# Patient Record
Sex: Male | Born: 1960 | Race: Black or African American | Hispanic: No | Marital: Single | Smoking: Current some day smoker
Health system: Southern US, Community
[De-identification: ages and names within clinical notes are randomized; demographics above are authoritative.]

## PROBLEM LIST (undated history)

## (undated) DIAGNOSIS — I10 Essential (primary) hypertension: Secondary | ICD-10-CM

---

## 2000-07-14 ENCOUNTER — Emergency Department (HOSPITAL_COMMUNITY): Admission: EM | Admit: 2000-07-14 | Discharge: 2000-07-14 | Payer: Self-pay | Admitting: Emergency Medicine

## 2003-10-01 ENCOUNTER — Emergency Department (HOSPITAL_COMMUNITY): Admission: EM | Admit: 2003-10-01 | Discharge: 2003-10-01 | Payer: Self-pay | Admitting: Family Medicine

## 2003-10-01 ENCOUNTER — Emergency Department (HOSPITAL_COMMUNITY): Admission: EM | Admit: 2003-10-01 | Discharge: 2003-10-01 | Payer: Self-pay | Admitting: Emergency Medicine

## 2003-10-04 ENCOUNTER — Emergency Department (HOSPITAL_COMMUNITY): Admission: EM | Admit: 2003-10-04 | Discharge: 2003-10-04 | Payer: Self-pay | Admitting: Family Medicine

## 2004-01-28 ENCOUNTER — Encounter: Admission: RE | Admit: 2004-01-28 | Discharge: 2004-02-26 | Payer: Self-pay | Admitting: Chiropractic Medicine

## 2007-02-21 ENCOUNTER — Emergency Department (HOSPITAL_COMMUNITY): Admission: EM | Admit: 2007-02-21 | Discharge: 2007-02-21 | Payer: Self-pay | Admitting: Family Medicine

## 2010-10-25 ENCOUNTER — Emergency Department (INDEPENDENT_AMBULATORY_CARE_PROVIDER_SITE_OTHER): Payer: Self-pay

## 2010-10-25 ENCOUNTER — Emergency Department (HOSPITAL_BASED_OUTPATIENT_CLINIC_OR_DEPARTMENT_OTHER)
Admission: EM | Admit: 2010-10-25 | Discharge: 2010-10-25 | Disposition: A | Discharge status: Home / Self Care | Payer: Self-pay | Attending: Emergency Medicine | Admitting: Emergency Medicine

## 2010-10-25 DIAGNOSIS — M7040 Prepatellar bursitis, unspecified knee: Secondary | ICD-10-CM | POA: Insufficient documentation

## 2010-10-25 DIAGNOSIS — M25569 Pain in unspecified knee: Secondary | ICD-10-CM | POA: Insufficient documentation

## 2010-10-25 DIAGNOSIS — F17200 Nicotine dependence, unspecified, uncomplicated: Secondary | ICD-10-CM | POA: Insufficient documentation

## 2010-10-25 MED ORDER — IBUPROFEN 800 MG PO TABS: 800.0000 mg | ORAL_TABLET | Freq: Three times a day (TID) | ORAL | Status: AC

## 2010-10-25 NOTE — ED Provider Notes (Signed)
History    Scribed for No att. providers found, the patient was seen in room MH05/MH05. This chart was scribed by Katha Cabal. This patient's care was started at 20:00.     CSN: [id number] Arrival date & time: 10/25/2010  7:21 PM  Chief Complaint  Patient presents with  . Knee Pain    HPI  (Consider location/radiation/quality/duration/timing/severity/associated sxs/prior treatment)  HPI  Brandon Snow is a 50 y.o. male who presents to the Emergency Department complaining of gradual worsening of intermittent right knee pain that began about a month and a half ago but got worse 2 days ago.  Denies injury, edema, weakness, tingling and back pain.  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  Patient takes Ibuprofen for pain.  Denies any other health problems.   No PCP    PAST MEDICAL HISTORY:  History reviewed. No pertinent past medical history.  PAST SURGICAL HISTORY:  History reviewed. No pertinent past surgical history.  FAMILY HISTORY:  No family history on file.   SOCIAL HISTORY: History   Social History  . Marital Status: Single    Spouse Name: N/A    Number of Children: N/A  . Years of Education: N/A   Social History Main Topics  . Smoking status: Current Some Day Smoker  . Smokeless tobacco: None  . Alcohol Use: Yes  . Drug Use: No  . Sexually Active:    Other Topics Concern  . None   Social History Narrative  . None      Review of Systems  Review of Systems 10 Systems reviewed and are negative for acute change except as noted in the HPI.  Allergies  Review of patient's allergies indicates no known allergies.  Home Medications   Current Outpatient Rx  Name Route Sig Dispense Refill  . OVER THE COUNTER MEDICATION Oral Take 6 tablets by mouth daily. Vitamin Pack     . IBUPROFEN 800 MG PO TABS Oral Take 1 tablet (800 mg total) by mouth 3 (three) times daily. 21 tablet 0    Physical  Exam    BP 128/68  Pulse 73  Temp(Src) 98.1 F (36.7 C) (Oral)  Resp 18  Ht 5\' 8"  (1.727 m)  Wt 284 lb (128.822 kg)  BMI 43.18 kg/m2  SpO2 100%  Physical Exam  Nursing note and vitals reviewed. Constitutional: He is oriented to person, place, and time. He appears well-developed and well-nourished. No distress.  HENT:  Head: Normocephalic and atraumatic.  Eyes: EOM are normal. Pupils are equal, round, and reactive to light.  Neck: Neck supple.  Cardiovascular: Normal rate, regular rhythm and normal heart sounds.   Pulmonary/Chest: Effort normal and breath sounds normal. No respiratory distress. He has no wheezes. He has no rales.  Abdominal: Soft. There is no tenderness.  Musculoskeletal: Normal range of motion. He exhibits edema.       Anterior patella edema but more right than left.  Mild tenderness with palpation.  Good ROM.    Neurological: He is alert and oriented to person, place, and time. No sensory deficit.  Skin: Skin is warm and dry.       Skin is darkened and thickened over patella bilaterally.    Psychiatric: He has a normal mood and affect. His behavior is normal.    ED Course  Procedures (including critical care time)  OTHER DATA REVIEWED: Nursing notes, vital signs, and past medical records reviewed.  DIAGNOSTIC STUDIES: Oxygen Saturation is 100% on room air, normal by my interpretation.     LABS / RADIOLOGY:  No results found for this or any previous visit.  Dg Knee 4 Views W/patella Right  10/25/2010  *RADIOLOGY REPORT*  Clinical Data: Right anterior knee pain after twisting injury  RIGHT KNEE - COMPLETE 4+ VIEW  Comparison: None.  Findings: The knee is aligned.  The patella is well positioned on the sunrise view.  The joint spaces are maintained.  No acute or healing fracture, focal bony abnormality, joint effusion, or discrete soft tissue swelling is seen.  IMPRESSION: No acute bony abnormality.  Original Report Authenticated By: Britta Mccreedy, M.D.       ED COURSE / COORDINATION OF CARE:  Orders Placed This Encounter  Procedures  . DG Knee 4 Views W/Patella Right    MDM:  Bursa inflammation of right knee.  Will XR right knee.   Will treat prepatellar bursitis with antiinflammatories.  Discussed work activities and way to alleviate knee pressure at work.  Referral to orthopedist.     IMPRESSION: Diagnoses that have been ruled out:  Diagnoses that are still under consideration:  Final diagnoses:  Prepatellar bursitis     MEDICATIONS GIVEN IN THE E.D. Scheduled Meds:   Continuous Infusions:      DISCHARGE MEDICATIONS: New Prescriptions   IBUPROFEN (ADVIL,MOTRIN) 800 MG TABLET    Take 1 tablet (800 mg total) by mouth 3 (three) times daily.     I personally performed the services described in this documentation, which was scribed in my presence. The recorded information has been reviewed and considered. Hilario Quarry, MD         Hilario Quarry, MD 10/25/10 [phone]

## 2010-10-25 NOTE — ED Notes (Signed)
Right knee pain x 2 days-denies injury 

## 2010-11-18 ENCOUNTER — Ambulatory Visit: Payer: Self-pay | Admitting: Family Medicine

## 2010-11-25 ENCOUNTER — Ambulatory Visit (INDEPENDENT_AMBULATORY_CARE_PROVIDER_SITE_OTHER): Payer: Self-pay | Admitting: Family Medicine

## 2010-11-25 ENCOUNTER — Encounter: Payer: Self-pay | Admitting: Family Medicine

## 2010-11-25 VITALS — BP 128/83 | HR 91 | Temp 98.0°F | Ht 68.0 in | Wt 270.0 lb

## 2010-11-25 DIAGNOSIS — M25569 Pain in unspecified knee: Secondary | ICD-10-CM

## 2010-11-25 DIAGNOSIS — M25561 Pain in right knee: Secondary | ICD-10-CM

## 2010-11-25 MED ORDER — MELOXICAM 15 MG PO TABS: 15.0000 mg | ORAL_TABLET | Freq: Every day | ORAL | Status: DC

## 2010-11-25 NOTE — Patient Instructions (Signed)
You have patellar tendinitis of your right knee. Take meloxicam 15mg  daily with food for pain and inflammation  Icing 15 minutes at a time 3-4 times a day Once you get approval for coverage from [facility], call us at [phone] and we'll start you in physical therapy - this is the most important part of treatment. Eccentric exercise - squat on hill 3 sets of 10 once a day. See handout for other exercises/stretches that may help. Follow up with me about 6 weeks after physical therapy.

## 2010-11-26 ENCOUNTER — Encounter: Payer: Self-pay | Admitting: Family Medicine

## 2010-11-26 DIAGNOSIS — M25561 Pain in right knee: Secondary | ICD-10-CM | POA: Insufficient documentation

## 2010-11-26 NOTE — Assessment & Plan Note (Signed)
2/2 patellar tendinopathy.  He is going to contact [facility] to see if he can get [facility] coverage to start physical therapy.  Discussed home exercise program in the meantime, icing, start meloxicam with food for pain and inflammation.  Handout provided.  Chopat strap felt more comfortable to patient.  See instructions for further.  Advised to call when coverage goes through and we'll then order physical therapy.

## 2010-11-26 NOTE — Progress Notes (Signed)
  Subjective:    Patient ID: Brandon Snow, male    DOB: 1960-11-03, 50 y.o.   MRN: [id number]  PCP: None  HPI 50 yo M here for right knee pain.  Patient denies any known injury. States pain started about 2 months ago in anterior right knee. Some associated swelling but no bruising. Feels like going to give out at times when painful. No catching or locking. Took some ibuprofen. No prior right knee problems. 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 Went to ED and had x-rays that were normal - told he had bursitis and to follow-up here. Was given [name] paperwork today.  History reviewed. No pertinent past medical history.  Current Outpatient Prescriptions on File Prior to Visit  Medication Sig Dispense Refill  . OVER THE COUNTER MEDICATION Take 6 tablets by mouth daily. Vitamin Pack         History reviewed. No pertinent past surgical history.  No Known Allergies  History   Social History  . Marital Status: Single    Spouse Name: N/A    Number of Children: N/A  . Years of Education: N/A   Occupational History  . Not on file.   Social History Main Topics  . Smoking status: Current Some Day Smoker  . Smokeless tobacco: Not on file  . Alcohol Use: Yes  . Drug Use: No  . Sexually Active: Not on file   Other Topics Concern  . Not on file   Social History Narrative  . No narrative on file    Family History  Problem Relation Age of Onset  . Diabetes Mother   . Hyperlipidemia Mother   . Hyperlipidemia Father   . Hypertension Father   . Hypertension Sister   . Hyperlipidemia Sister   . Diabetes Sister   . Diabetes Maternal Grandfather   . Diabetes Paternal Grandmother   . Hyperlipidemia Paternal Grandmother   . Hypertension Paternal Grandmother   . Sudden death Paternal Grandmother   . Heart attack Neg Hx     BP 128/83  Pulse 91  Temp(Src) 98 F (36.7 C) (Oral)  Ht 5\' 8"  (1.727 m)  Wt 270 lb (122.471 kg)  BMI 41.05 kg/m2  Review  of Systems See HPI above.    Objective:   Physical Exam Gen: NAD R knee: No gross deformity, ecchymoses, swelling. Mild TTP patellar tendon.  No TTP prepatellar area, joint lines, pes, post patellar facets, elsewhere about right knee. FROM. Negative ant/post drawers. Negative valgus/varus testing. Negative lachmanns. Negative mcmurrays, apleys, patellar apprehension, clarkes. NV intact distally.    Assessment & Plan:  1. Right knee pain - 2/2 patellar tendinopathy.  He is going to contact [facility] to see if he can get [facility] coverage to start physical therapy.  Discussed home exercise program in the meantime, icing, start meloxicam with food for pain and inflammation.  Handout provided.  Chopat strap felt more comfortable to patient.  See instructions for further.  Advised to call when coverage goes through and we'll then order physical therapy.

## 2010-12-28 ENCOUNTER — Emergency Department (HOSPITAL_BASED_OUTPATIENT_CLINIC_OR_DEPARTMENT_OTHER)
Admission: EM | Admit: 2010-12-28 | Discharge: 2010-12-28 | Disposition: A | Discharge status: Home / Self Care | Payer: Self-pay | Attending: Emergency Medicine | Admitting: Emergency Medicine

## 2010-12-28 ENCOUNTER — Encounter (HOSPITAL_BASED_OUTPATIENT_CLINIC_OR_DEPARTMENT_OTHER): Payer: Self-pay | Admitting: Emergency Medicine

## 2010-12-28 DIAGNOSIS — K0889 Other specified disorders of teeth and supporting structures: Secondary | ICD-10-CM

## 2010-12-28 DIAGNOSIS — Z79899 Other long term (current) drug therapy: Secondary | ICD-10-CM | POA: Insufficient documentation

## 2010-12-28 DIAGNOSIS — K089 Disorder of teeth and supporting structures, unspecified: Secondary | ICD-10-CM | POA: Insufficient documentation

## 2010-12-28 MED ORDER — HYDROCODONE-ACETAMINOPHEN 5-500 MG PO TABS: 1.0000 | ORAL_TABLET | Freq: Four times a day (QID) | ORAL | Status: AC | PRN

## 2010-12-28 MED ORDER — KETOROLAC TROMETHAMINE 60 MG/2ML IM SOLN
60.0000 mg | Freq: Once | INTRAMUSCULAR | Status: AC
  Administered 2010-12-28: 60 mg via INTRAMUSCULAR
  Filled 2010-12-28: qty 2

## 2010-12-28 MED ORDER — PENICILLIN V POTASSIUM 500 MG PO TABS: 500.0000 mg | ORAL_TABLET | Freq: Four times a day (QID) | ORAL | Status: AC

## 2010-12-28 NOTE — ED Provider Notes (Signed)
History     CSN: [id number] Arrival date & time: 12/28/2010  1:08 AM   First MD Initiated Contact with Patient 12/28/10 0145      Chief Complaint  Patient presents with  . Dental Pain    (Consider location/radiation/quality/duration/timing/severity/associated sxs/prior treatment) HPI Comments: Toothache, long history of severe dental disease, seen dentist at [facility] for same. Was seen 2 weeks ago and told needed to have a tooth pulled  Symptoms are constant Symptoms are gradually getting worse Not associated with fever or vomiting Worse with chewing and temperature sensitivity  Patient is a 50 y.o. male presenting with tooth pain. The history is provided by the patient.  Dental PainPrimary symptoms do not include fever or sore throat. Primary symptoms comment: Toothache Episode onset: Several days ago. The symptoms are worsening. The symptoms are chronic. The symptoms occur constantly.  Additional symptoms include: dental sensitivity to temperature and gum tenderness. Additional symptoms do not include: gum swelling, purulent gums, trismus, jaw pain, facial swelling, trouble swallowing, pain with swallowing and ear pain. Associated symptoms comments: No fever or vomiting.    History reviewed. No pertinent past medical history.  History reviewed. No pertinent past surgical history.  Family History  Problem Relation Age of Onset  . Diabetes Mother   . Hyperlipidemia Mother   . Hyperlipidemia Father   . Hypertension Father   . Hypertension Sister   . Hyperlipidemia Sister   . Diabetes Sister   . Diabetes Maternal Grandfather   . Diabetes Paternal Grandmother   . Hyperlipidemia Paternal Grandmother   . Hypertension Paternal Grandmother   . Sudden death Paternal Grandmother   . Heart attack Neg Hx     History  Substance Use Topics  . Smoking status: Current Some Day Smoker  . Smokeless tobacco: Not on file  . Alcohol Use: Yes      Review of Systems    Constitutional: Negative for fever and chills.  HENT: Positive for dental problem. Negative for ear pain, sore throat, facial swelling, trouble swallowing and voice change.        Toothache  Gastrointestinal: Negative for nausea and vomiting.    Allergies  Review of patient's allergies indicates no known allergies.  Home Medications   Current Outpatient Rx  Name Route Sig Dispense Refill  . ACETAMINOPHEN 325 MG PO TABS Oral Take 650 mg by mouth as needed.      Marland Kitchen HYDROCODONE-ACETAMINOPHEN 5-500 MG PO TABS Oral Take 1-2 tablets by mouth every 6 (six) hours as needed for pain. 15 tablet 0  . MELOXICAM 15 MG PO TABS Oral Take 1 tablet (15 mg total) by mouth daily. With food. 30 tablet 1  . OVER THE COUNTER MEDICATION Oral Take 6 tablets by mouth daily. Vitamin Pack     . PENICILLIN V POTASSIUM 500 MG PO TABS Oral Take 1 tablet (500 mg total) by mouth 4 (four) times daily. 40 tablet 0    BP 215/95  Pulse 68  Temp(Src) 97.8 F (36.6 C) (Oral)  Resp 19  SpO2 96%  Physical Exam  Nursing note and vitals reviewed. Constitutional: He appears well-developed and well-nourished. No distress.  HENT:  Head: Normocephalic and atraumatic.  Mouth/Throat: Oropharynx is clear and moist. No oropharyngeal exudate.       Dental Disease - multiple missing teeth, right upper tooth with severe disease, has been decayed to the level of the gums, no palpable abscess or fluctuant mass  Eyes: Conjunctivae are normal. No scleral icterus.  Neck: Normal  range of motion. Neck supple. No thyromegaly present.  Cardiovascular: Normal rate and regular rhythm.   Pulmonary/Chest: Effort normal and breath sounds normal.  Lymphadenopathy:    He has no cervical adenopathy.  Neurological: He is alert.  Skin: Skin is warm and dry. No rash noted. He is not diaphoretic.    ED Course  Procedures (including critical care time)  Labs Reviewed - No data to display No results found.   1. Toothache       MDM   Dental disease significant, pain medication given and encouraged to followup closely for further definitive dental intervention. Penicillin and hydrocodone and ibuprofen for home. Will also encourage recheck hypertension.  Intramuscular Toradol given  On recheck, vital signs appear acceptable for discharge.        Vida Roller, MD 12/28/10 [phone]

## 2010-12-28 NOTE — ED Notes (Signed)
Pt c/o tooth pain upper front tooth.

## 2010-12-28 NOTE — ED Notes (Signed)
Pt states he has not been taking his mobic regularly.

## 2011-06-22 ENCOUNTER — Emergency Department (HOSPITAL_BASED_OUTPATIENT_CLINIC_OR_DEPARTMENT_OTHER)
Admission: EM | Admit: 2011-06-22 | Discharge: 2011-06-22 | Disposition: A | Discharge status: Home / Self Care | Payer: No Typology Code available for payment source | Attending: Emergency Medicine | Admitting: Emergency Medicine

## 2011-06-22 ENCOUNTER — Emergency Department (HOSPITAL_BASED_OUTPATIENT_CLINIC_OR_DEPARTMENT_OTHER): Payer: No Typology Code available for payment source

## 2011-06-22 ENCOUNTER — Encounter (HOSPITAL_BASED_OUTPATIENT_CLINIC_OR_DEPARTMENT_OTHER): Payer: Self-pay

## 2011-06-22 DIAGNOSIS — M545 Low back pain, unspecified: Secondary | ICD-10-CM | POA: Insufficient documentation

## 2011-06-22 DIAGNOSIS — M549 Dorsalgia, unspecified: Secondary | ICD-10-CM | POA: Insufficient documentation

## 2011-06-22 MED ORDER — HYDROCODONE-ACETAMINOPHEN 5-500 MG PO TABS: 1.0000 | ORAL_TABLET | Freq: Four times a day (QID) | ORAL | Status: AC | PRN

## 2011-06-22 NOTE — ED Provider Notes (Signed)
Medical screening examination/treatment/procedure(s) were performed by non-physician practitioner and as supervising physician I was immediately available for consultation/collaboration.  Ethelda Chick, MD 06/22/11 [phone]
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
## 2011-06-22 NOTE — Discharge Instructions (Signed)
Back Pain, Adult Low back pain is very common. About 1 in 5 people have back pain.The cause of low back pain is rarely dangerous. The pain often gets better over time.About half of people with a sudden onset of back pain feel better in just 2 weeks. About 8 in 10 people feel better by 6 weeks.  CAUSES Some common causes of back pain include:  Strain of the muscles or ligaments supporting the spine.   Wear and tear (degeneration) of the spinal discs.   Arthritis.   Direct injury to the back.  DIAGNOSIS Most of the time, the direct cause of low back pain is not known.However, back pain can be treated effectively even when the exact cause of the pain is unknown.Answering your caregiver's questions about your overall health and symptoms is one of the most accurate ways to make sure the cause of your pain is not dangerous. If your caregiver needs more information, he or she may order lab work or imaging tests (X-rays or MRIs).However, even if imaging tests show changes in your back, this usually does not require surgery. HOME CARE INSTRUCTIONS For many people, back pain returns.Since low back pain is rarely dangerous, it is often a condition that people can learn to manageon their own.   Remain active. It is stressful on the back to sit or stand in one place. Do not sit, drive, or stand in one place for more than 30 minutes at a time. Take short walks on level surfaces as soon as pain allows.Try to increase the length of time you walk each day.   Do not stay in bed.Resting more than 1 or 2 days can delay your recovery.   Do not avoid exercise or work.Your body is made to move.It is not dangerous to be active, even though your back may hurt.Your back will likely heal faster if you return to being active before your pain is gone.   Pay attention to your body when you bend and lift. Many people have less discomfortwhen lifting if they bend their knees, keep the load close to their  bodies,and avoid twisting. Often, the most comfortable positions are those that put less stress on your recovering back.   Find a comfortable position to sleep. Use a firm mattress and lie on your side with your knees slightly bent. If you lie on your back, put a pillow under your knees.   Only take over-the-counter or prescription medicines as directed by your caregiver. Over-the-counter medicines to reduce pain and inflammation are often the most helpful.Your caregiver may prescribe muscle relaxant drugs.These medicines help dull your pain so you can more quickly return to your normal activities and healthy exercise.   Put ice on the injured area.   Put ice in a plastic bag.   Place a towel between your skin and the bag.   Leave the ice on for 15 to 20 minutes, 3 to 4 times a day for the first 2 to 3 days. After that, ice and heat may be alternated to reduce pain and spasms.   Ask your caregiver about trying back exercises and gentle massage. This may be of some benefit.   Avoid feeling anxious or stressed.Stress increases muscle tension and can worsen back pain.It is important to recognize when you are anxious or stressed and learn ways to manage it.Exercise is a great option.  SEEK MEDICAL CARE IF:  You have pain that is not relieved with rest or medicine.   You have   pain that does not improve in 1 week.   You have new symptoms.   You are generally not feeling well.  SEEK IMMEDIATE MEDICAL CARE IF:   You have pain that radiates from your back into your legs.   You develop new bowel or bladder control problems.   You have unusual weakness or numbness in your arms or legs.   You develop nausea or vomiting.   You develop abdominal pain.   You feel faint.  Document Released: 01/17/2005 Document Revised: 01/06/2011 Document Reviewed: 06/07/2010 [employer] Patient Information 2012 [employer]. 

## 2011-06-23 MED ORDER — FLUORESCEIN SODIUM 1 MG OP STRP
ORAL_STRIP | OPHTHALMIC | Status: AC
  Filled 2011-06-23: qty 1

## 2011-06-23 MED ORDER — TETRACAINE HCL 0.5 % OP SOLN
OPHTHALMIC | Status: AC
  Filled 2011-06-23: qty 2

## 2011-07-27 ENCOUNTER — Emergency Department (HOSPITAL_BASED_OUTPATIENT_CLINIC_OR_DEPARTMENT_OTHER)
Admission: EM | Admit: 2011-07-27 | Discharge: 2011-07-27 | Disposition: A | Discharge status: Home / Self Care | Payer: Self-pay | Attending: Emergency Medicine | Admitting: Emergency Medicine

## 2011-07-27 ENCOUNTER — Encounter (HOSPITAL_BASED_OUTPATIENT_CLINIC_OR_DEPARTMENT_OTHER): Payer: Self-pay | Admitting: *Deleted

## 2011-07-27 DIAGNOSIS — F17200 Nicotine dependence, unspecified, uncomplicated: Secondary | ICD-10-CM | POA: Insufficient documentation

## 2011-07-27 DIAGNOSIS — X500XXA Overexertion from strenuous movement or load, initial encounter: Secondary | ICD-10-CM | POA: Insufficient documentation

## 2011-07-27 DIAGNOSIS — S93409A Sprain of unspecified ligament of unspecified ankle, initial encounter: Secondary | ICD-10-CM | POA: Insufficient documentation

## 2011-07-27 DIAGNOSIS — Y9269 Other specified industrial and construction area as the place of occurrence of the external cause: Secondary | ICD-10-CM | POA: Insufficient documentation

## 2011-07-27 MED ORDER — IBUPROFEN 600 MG PO TABS: 600.0000 mg | ORAL_TABLET | Freq: Four times a day (QID) | ORAL | Status: AC | PRN

## 2011-07-27 MED ORDER — TRAMADOL HCL 50 MG PO TABS: 50.0000 mg | ORAL_TABLET | Freq: Four times a day (QID) | ORAL | Status: AC | PRN

## 2011-07-27 NOTE — ED Provider Notes (Signed)
History     CSN: [id number]  Arrival date & time 07/27/11  4098   First MD Initiated Contact with Patient 07/27/11 2002      Chief Complaint  Patient presents with  . Foot Pain  Pt p/w 3 days of L ankle pain. No definite trauma. Pain is worse with standing and improves when off feet. 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 No pertinent past medical history.  History reviewed. No pertinent past surgical history.  Family History  Problem Relation Age of Onset  . Diabetes Mother   . Hyperlipidemia Mother   . Hyperlipidemia Father   . Hypertension Father   . Hypertension Sister   . Hyperlipidemia Sister   . Diabetes Sister   . Diabetes Maternal Grandfather   . Diabetes Paternal Grandmother   . Hyperlipidemia Paternal Grandmother   . Hypertension Paternal Grandmother   . Sudden death Paternal Grandmother   . Heart attack Neg Hx     History  Substance Use Topics  . Smoking status: Current Some Day Smoker  . Smokeless tobacco: Not on file  . Alcohol Use: No      Review of Systems  Musculoskeletal: Positive for arthralgias. Negative for joint swelling.  Skin: Negative for rash and wound.  Neurological: Negative for weakness and numbness.    Allergies  Review of patient's allergies indicates no known allergies.  Home Medications   Current Outpatient Rx  Name Route Sig Dispense Refill  . IBUPROFEN 600 MG PO TABS Oral Take 1 tablet (600 mg total) by mouth every 6 (six) hours as needed for pain. 30 tablet 0  . TRAMADOL HCL 50 MG PO TABS Oral Take 1 tablet (50 mg total) by mouth every 6 (six) hours as needed for pain. 20 tablet 0    BP 158/86  Pulse 95  Temp 98.1 F (36.7 C) (Oral)  Resp 16  Ht 5\' 8"  (1.727 m)  Wt 290 lb (131.543 kg)  BMI 44.09 kg/m2  SpO2 100%  Physical Exam  Nursing note and vitals reviewed. Constitutional: He  is oriented to person, place, and time. He appears well-developed and well-nourished. No distress.  HENT:  Head: Normocephalic and atraumatic.  Pulmonary/Chest: Effort normal and breath sounds normal.  Abdominal: Soft. Bowel sounds are normal.  Musculoskeletal: Normal range of motion. He exhibits tenderness (mild ttp distal to L lateral malleolus. No swelling or obvious injury. ROM. Neurovasc intact). He exhibits no edema.  Neurological: He is alert and oriented to person, place, and time.  Skin: Skin is warm and dry. No rash noted. No erythema.  Psychiatric: He has a normal mood and affect. His behavior is normal.    ED Course  Procedures (including critical care time)  Labs Reviewed - No data to display No results found.   1. Ankle sprain       MDM          Loren Racer, MD 07/27/11 2014

## 2011-07-27 NOTE — Discharge Instructions (Signed)
Ankle Sprain An ankle sprain is an injury to the strong, fibrous tissues (ligaments) that hold the bones of your ankle joint together.  CAUSES Ankle sprain usually is caused by a fall or by twisting your ankle. People who participate in sports are more prone to these types of injuries.  SYMPTOMS  Symptoms of ankle sprain include:  Pain in your ankle. The pain may be present at rest or only when you are trying to stand or walk.   Swelling.   Bruising. Bruising may develop immediately or within 1 to 2 days after your injury.   Difficulty standing or walking.  DIAGNOSIS  Your caregiver will ask you details about your injury and perform a physical exam of your ankle to determine if you have an ankle sprain. During the physical exam, your caregiver will press and squeeze specific areas of your foot and ankle. Your caregiver will try to move your ankle in certain ways. An X-ray exam may be done to be sure a bone was not broken or a ligament did not separate from one of the bones in your ankle (avulsion).  TREATMENT  Certain types of braces can help stabilize your ankle. Your caregiver can make a recommendation for this. Your caregiver may recommend the use of medication for pain. If your sprain is severe, your caregiver may refer you to a surgeon who helps to restore function to parts of your skeletal system (orthopedist) or a physical therapist. HOME CARE INSTRUCTIONS  Apply ice to your injury for 1 to 2 days or as directed by your caregiver. Applying ice helps to reduce inflammation and pain.  Put ice in a plastic bag.   Place a towel between your skin and the bag.   Leave the ice on for 15 to 20 minutes at a time, every 2 hours while you are awake.   Take over-the-counter or prescription medicines for pain, discomfort, or fever only as directed by your caregiver.   Keep your injured leg elevated, when possible, to lessen swelling.   If your caregiver recommends crutches, use them as  instructed. Gradually, put weight on the affected ankle. Continue to use crutches or a cane until you can walk without feeling pain in your ankle.   If you have a plaster splint, wear the splint as directed by your caregiver. Do not rest it on anything harder than a pillow the first 24 hours. Do not put weight on it. Do not get it wet. You may take it off to take a shower or bath.   You may have been given an elastic bandage to wear around your ankle to provide support. If the elastic bandage is too tight (you have numbness or tingling in your foot or your foot becomes cold and blue), adjust the bandage to make it comfortable.   If you have an air splint, you may blow more air into it or let air out to make it more comfortable. You may take your splint off at night and before taking a shower or bath.   Wiggle your toes in the splint several times per day if you are able.  SEEK MEDICAL CARE IF:   You have an increase in bruising, swelling, or pain.   Your toes feel cold.   Pain relief is not achieved with medication.  SEEK IMMEDIATE MEDICAL CARE IF: Your toes are numb or blue or you have severe pain. MAKE SURE YOU:   Understand these instructions.   Will watch your condition.     Will get help right away if you are not doing well or get worse.  Document Released: 01/17/2005 Document Revised: 01/06/2011 Document Reviewed: 08/22/2007 [employer] Patient Information 2012 [employer]. 

## 2011-07-27 NOTE — ED Notes (Signed)
Pt c/o left foot pain x 3 days , denies injury

## 2014-04-06 ENCOUNTER — Encounter (HOSPITAL_BASED_OUTPATIENT_CLINIC_OR_DEPARTMENT_OTHER): Payer: Self-pay | Admitting: *Deleted

## 2014-04-06 ENCOUNTER — Emergency Department (HOSPITAL_BASED_OUTPATIENT_CLINIC_OR_DEPARTMENT_OTHER)
Admission: EM | Admit: 2014-04-06 | Discharge: 2014-04-06 | Disposition: A | Discharge status: Home / Self Care | Payer: Self-pay | Attending: Emergency Medicine | Admitting: Emergency Medicine

## 2014-04-06 ENCOUNTER — Emergency Department (HOSPITAL_BASED_OUTPATIENT_CLINIC_OR_DEPARTMENT_OTHER): Payer: Self-pay

## 2014-04-06 DIAGNOSIS — B349 Viral infection, unspecified: Secondary | ICD-10-CM | POA: Insufficient documentation

## 2014-04-06 DIAGNOSIS — Z720 Tobacco use: Secondary | ICD-10-CM | POA: Insufficient documentation

## 2014-04-06 DIAGNOSIS — M791 Myalgia: Secondary | ICD-10-CM | POA: Insufficient documentation

## 2014-04-06 MED ORDER — BENZONATATE 100 MG PO CAPS: 100.0000 mg | ORAL_CAPSULE | Freq: Three times a day (TID) | ORAL | Status: DC

## 2014-04-06 MED ORDER — BENZONATATE 100 MG PO CAPS
200.0000 mg | ORAL_CAPSULE | Freq: Once | ORAL | Status: AC
  Administered 2014-04-06: 200 mg via ORAL
  Filled 2014-04-06: qty 2

## 2014-04-06 MED ORDER — IBUPROFEN 800 MG PO TABS: 800.0000 mg | ORAL_TABLET | Freq: Three times a day (TID) | ORAL | Status: DC

## 2014-04-06 MED ORDER — LORATADINE 10 MG PO TABS
10.0000 mg | ORAL_TABLET | Freq: Once | ORAL | Status: AC
  Administered 2014-04-06: 10 mg via ORAL
  Filled 2014-04-06: qty 1

## 2014-04-06 MED ORDER — CETIRIZINE-PSEUDOEPHEDRINE ER 5-120 MG PO TB12
1.0000 | ORAL_TABLET | Freq: Two times a day (BID) | ORAL | Status: DC
Start: 2014-04-06 — End: 2016-10-15

## 2014-04-06 MED ORDER — GUAIFENESIN 100 MG/5ML PO SOLN
5.0000 mL | Freq: Once | ORAL | Status: AC
  Administered 2014-04-06: 100 mg via ORAL
  Filled 2014-04-06: qty 5

## 2014-04-06 MED ORDER — IBUPROFEN 800 MG PO TABS
800.0000 mg | ORAL_TABLET | Freq: Once | ORAL | Status: AC
  Administered 2014-04-06: 800 mg via ORAL
  Filled 2014-04-06: qty 1

## 2014-04-06 NOTE — ED Notes (Signed)
C/o fever, chills, dry cough for past several days. States he has been taking mucinex and tylenol without relief. C/o ant chest pain with cough. Denies any sob. resp even and unlabored.

## 2014-04-06 NOTE — ED Notes (Signed)
Dr. Nicanor AlconPalumbo in to see pt prior to RN assessment, see MD notes, orders received and initiated.

## 2014-04-06 NOTE — ED Provider Notes (Signed)
CSN: [id number]     Arrival date & time 04/06/14  0106 History   First MD Initiated Contact with Patient 04/06/14 [phone]     Chief Complaint  Patient presents with  . Cough     (Consider location/radiation/quality/duration/timing/severity/associated sxs/prior Treatment) Patient is a 54 y.o. male presenting with cough. The history is provided by the patient.  Cough Cough characteristics:  Non-productive Severity:  Moderate Onset quality:  Gradual Duration:  5 days Timing:  Intermittent Progression:  Unchanged Chronicity:  New Smoker: yes   Context: not animal exposure   Relieved by:  Nothing Worsened by:  Nothing tried Ineffective treatments:  None tried Associated symptoms: myalgias and sinus congestion   Associated symptoms: no chest pain, no chills, no ear pain, no shortness of breath, no sore throat and no wheezing   Risk factors: no chemical exposure     History reviewed. No pertinent past medical history. History reviewed. No pertinent past surgical history. Family History  Problem Relation Age of Onset  . Diabetes Mother   . Hyperlipidemia Mother   . Hyperlipidemia Father   . Hypertension Father   . Hypertension Sister   . Hyperlipidemia Sister   . Diabetes Sister   . Diabetes Maternal Grandfather   . Diabetes Paternal Grandmother   . Hyperlipidemia Paternal Grandmother   . Hypertension Paternal Grandmother   . Sudden death Paternal Grandmother   . Heart attack Neg Hx    History  Substance Use Topics  . Smoking status: Current Some Day Smoker  . Smokeless tobacco: Not on file  . Alcohol Use: No    Review of Systems  Constitutional: Negative for chills.  HENT: Positive for congestion. Negative for ear pain, facial swelling and sore throat.   Respiratory: Positive for cough. Negative for shortness of breath and wheezing.   Cardiovascular: Negative for chest pain.  Musculoskeletal: Positive for myalgias.  All other systems reviewed and are  negative.     Allergies  Review of patient's allergies indicates no known allergies.  Home Medications   Prior to Admission medications   Not on File   BP 126/73 mmHg  Pulse 95  Temp(Src) 99.9 F (37.7 C) (Oral)  Resp 20  Ht 5\' 8"  (1.727 m)  Wt 294 lb 1 oz (133.386 kg)  BMI 44.72 kg/m2  SpO2 96% Physical Exam  Constitutional: He is oriented to person, place, and time. He appears well-developed and well-nourished. No distress.  HENT:  Head: Normocephalic and atraumatic.  Mouth/Throat: Oropharynx is clear and moist. No oropharyngeal exudate.  Eyes: Conjunctivae and EOM are normal. Pupils are equal, round, and reactive to light.  Neck: Normal range of motion.  Cardiovascular: Normal rate, regular rhythm and intact distal pulses.   Pulmonary/Chest: Effort normal and breath sounds normal. No stridor. No respiratory distress. He has no wheezes. He has no rales.  Abdominal: Soft. Bowel sounds are normal. There is no tenderness. There is no rebound.  Musculoskeletal: Normal range of motion.  Lymphadenopathy:    He has no cervical adenopathy.  Neurological: He is alert and oriented to person, place, and time.  Skin: Skin is warm and dry.  Psychiatric: He has a normal mood and affect.    ED Course  Procedures (including critical care time) Labs Review Labs Reviewed - No data to display  Imaging Review Dg Chest 2 View  04/06/2014   CLINICAL DATA:  Acute onset of fever, chills and dry cough. Chest pain. Initial encounter.  EXAM: CHEST  2 VIEW  COMPARISON:  None.  FINDINGS: The lungs are well-aerated and clear. There is no evidence of focal opacification, pleural effusion or pneumothorax.  The heart is normal in size; the mediastinal contour is within normal limits. No acute osseous abnormalities are seen.  IMPRESSION: No acute cardiopulmonary process seen.   Electronically Signed   By: Roanna Raider M.D.   On: 04/06/2014 02:18     EKG Interpretation None      MDM   Final  diagnoses:  None    URI will treat symptomatically, strict return precautions given    Taviana Westergren Smitty Cords, MD 04/06/14 [phone]

## 2015-07-08 ENCOUNTER — Emergency Department (HOSPITAL_BASED_OUTPATIENT_CLINIC_OR_DEPARTMENT_OTHER)
Admission: EM | Admit: 2015-07-08 | Discharge: 2015-07-08 | Disposition: A | Discharge status: Home / Self Care | Payer: No Typology Code available for payment source | Attending: Emergency Medicine | Admitting: Emergency Medicine

## 2015-07-08 ENCOUNTER — Encounter (HOSPITAL_BASED_OUTPATIENT_CLINIC_OR_DEPARTMENT_OTHER): Payer: Self-pay | Admitting: *Deleted

## 2015-07-08 DIAGNOSIS — J029 Acute pharyngitis, unspecified: Secondary | ICD-10-CM | POA: Insufficient documentation

## 2015-07-08 DIAGNOSIS — B9789 Other viral agents as the cause of diseases classified elsewhere: Secondary | ICD-10-CM

## 2015-07-08 DIAGNOSIS — F17210 Nicotine dependence, cigarettes, uncomplicated: Secondary | ICD-10-CM | POA: Insufficient documentation

## 2015-07-08 DIAGNOSIS — M549 Dorsalgia, unspecified: Secondary | ICD-10-CM | POA: Insufficient documentation

## 2015-07-08 DIAGNOSIS — R000 Tachycardia, unspecified: Secondary | ICD-10-CM | POA: Insufficient documentation

## 2015-07-08 DIAGNOSIS — I10 Essential (primary) hypertension: Secondary | ICD-10-CM | POA: Insufficient documentation

## 2015-07-08 DIAGNOSIS — J028 Acute pharyngitis due to other specified organisms: Secondary | ICD-10-CM

## 2015-07-08 DIAGNOSIS — K0889 Other specified disorders of teeth and supporting structures: Secondary | ICD-10-CM | POA: Insufficient documentation

## 2015-07-08 HISTORY — DX: Essential (primary) hypertension: I10

## 2015-07-08 MED ORDER — AMOXICILLIN-POT CLAVULANATE 875-125 MG PO TABS: 1.0000 | ORAL_TABLET | Freq: Two times a day (BID) | ORAL | Status: DC

## 2015-07-08 MED ORDER — BUPIVACAINE-EPINEPHRINE (PF) 0.5% -1:200000 IJ SOLN
1.8000 mL | Freq: Once | INTRAMUSCULAR | Status: AC
Start: 2015-07-08 — End: 2015-07-08
  Administered 2015-07-08: 1.8 mL
  Filled 2015-07-08: qty 1.8

## 2015-07-08 NOTE — Discharge Instructions (Signed)
We did a nerve block today in the ED to try and provide some relief. This should last most of the day but it will eventually wear off.  Please call your dentist and discuss changing the procedure to removing that tooth first prior to the other teeth that was planned.  Take the antibiotic as prescribed.   You can use tylenol or ibuprofen to help with the pain, and you can also put cold compresses the right side of your jaw.  Your sore throat is most likely a virus, and should get better with time.

## 2015-07-08 NOTE — ED Provider Notes (Signed)
CSN: [id number]     Arrival date & time 07/08/15  1010 History   First MD Initiated Contact with Patient 07/08/15 1028     Chief Complaint  Patient presents with  . Generalized Body Aches  . Dental Pain   HPI Brandon Snow is a 55 y.o. male  presenting with fever, sore throat, tooth pain. Patient says he first had sore throat and fever about 3 days ago. Measured fever to be 101F (girlfriend is a Theatre stage manager and checked). Sore throat has been severe, 7/10; he is still able to swallow foods. Yesterday he says that he started developing right lower jaw tooth pain with temperature sensitivity that was interferring with drinking liquids. He took some tylenol this morning and seems to have helped the pain a little bit. He reports he has been seeing a dentist, was planned on removing left lower incisor later this week and eventually remove all his lower teeth and replace with denture (currently has no upper teeth, already planned denture replacement). He additionally complains of some right back/rib pain that is a dull ache. 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    (Consider location/radiation/quality/duration/timing/severity/associated sxs/prior Treatment) Patient is a 55 y.o. male presenting with tooth pain. The history is provided by the patient.  Dental Pain Location:  Lower Lower teeth location:  29/RL 2nd bicuspid Quality:  Aching Severity:  Moderate Onset quality:  Sudden Duration:  1 day Timing:  Constant Progression:  Unchanged Chronicity:  New Context: dental caries, poor dentition and recent dental surgery   Context: not trauma   Previous work-up:  Dental exam Relieved by:  Acetaminophen Worsened by:  Cold food/drink and touching Associated symptoms: fever   Associated symptoms: no neck pain, no neck swelling and no oral bleeding     Past Medical History  Diagnosis Date  . Hypertension    History reviewed. No pertinent past surgical  history. Family History  Problem Relation Age of Onset  . Diabetes Mother   . Hyperlipidemia Mother   . Hyperlipidemia Father   . Hypertension Father   . Hypertension Sister   . Hyperlipidemia Sister   . Diabetes Sister   . Diabetes Maternal Grandfather   . Diabetes Paternal Grandmother   . Hyperlipidemia Paternal Grandmother   . Hypertension Paternal Grandmother   . Sudden death Paternal Grandmother   . Heart attack Neg Hx    Social History  Substance Use Topics  . Smoking status: Current Some Day Smoker -- 0.50 packs/day    Types: Cigarettes  . Smokeless tobacco: Never Used  . Alcohol Use: No    Review of Systems  Constitutional: Positive for fever.  HENT: Positive for sore throat and trouble swallowing. Negative for ear discharge, ear pain and rhinorrhea.   Eyes: Negative for pain and redness.  Respiratory: Negative for shortness of breath.   Cardiovascular: Negative for chest pain.  Gastrointestinal: Negative for nausea, vomiting, abdominal pain, diarrhea, constipation and blood in stool.  Genitourinary: Negative for dysuria and difficulty urinating.  Musculoskeletal: Positive for back pain. Negative for neck pain.  Skin: Negative for rash.  Neurological: Negative for light-headedness.  All other systems reviewed and are negative.     Allergies  Review of patient's allergies indicates no known allergies.  Home Medications   Prior to Admission medications   Medication Sig Start Date End Date Taking? Authorizing Provider  benzonatate (TESSALON) 100 MG capsule Take 1 capsule (100 mg total) by  mouth every 8 (eight) hours. 04/06/14   April Palumbo, MD  cetirizine-pseudoephedrine (ZYRTEC-D) 5-120 MG per tablet Take 1 tablet by mouth 2 (two) times daily. 04/06/14   April Palumbo, MD  ibuprofen (ADVIL,MOTRIN) 800 MG tablet Take 1 tablet (800 mg total) by mouth 3 (three) times daily. 04/06/14   April Palumbo, MD   BP 152/100 mmHg  Pulse 108  Temp(Src) 99.4 F (37.4 C)  (Oral)  Resp 18  Ht 5' 8.5" (1.74 m)  Wt 129.275 kg  BMI 42.70 kg/m2  SpO2 99% Physical Exam  Constitutional: He is oriented to person, place, and time. He appears well-developed and well-nourished.  HENT:  Head: Normocephalic and atraumatic.  Right Ear: External ear normal.  Mouth/Throat: Abnormal dentition. Dental caries present. Posterior oropharyngeal erythema present. No oropharyngeal exudate or tonsillar abscesses.    All upper teeth missing. Missing majority of lower teeth, primarily front teeth left with molars removed. Poor dentition.   Left ear TM obstructed by cerumen  Eyes: Conjunctivae and EOM are normal. Pupils are equal, round, and reactive to light.  Neck: Normal range of motion. Neck supple.  No neck tenderness, no lymphadenopathy  Cardiovascular: Regular rhythm, normal heart sounds and intact distal pulses.  Tachycardia present.   Pulmonary/Chest: Effort normal and breath sounds normal. No respiratory distress.    Neurological: He is alert and oriented to person, place, and time.  Skin: Skin is warm and dry.  Psychiatric: He has a normal mood and affect. Thought content normal.  Nursing note and vitals reviewed.   ED Course  Procedures (including critical care time) Labs Review Labs Reviewed - No data to display  Imaging Review No results found. I have personally reviewed and evaluated these images and lab results as part of my medical decision-making.   EKG Interpretation None      MDM   Final diagnoses:  Pain in tooth  Sore throat (viral)    Modified centor score 1 (if counting measured fever 2 days ago) -- very low suspicion of strep throat. Bupivicaine inferior alveolar nerve block done in ED. Will discharge on antibiotics  Back/rib pain is likely musculoskeletal given his occupation, recommended stretching exercises, cold compresses, OTC analgesics.    Nani RavensAndrew M Elvena Oyer, MD 07/08/15 1302  Melene Planan Floyd, DO 07/08/15 [phone]

## 2015-07-08 NOTE — ED Notes (Signed)
MD at bedside. 

## 2015-07-08 NOTE — ED Notes (Signed)
Right side dental pain, body aches, chills, "slight fever" x 3 days. Pt reports he took 2 extra-strength tylenol at 0600

## 2015-07-08 NOTE — ED Notes (Signed)
Pt reports sore throat also

## 2016-10-15 ENCOUNTER — Encounter (HOSPITAL_BASED_OUTPATIENT_CLINIC_OR_DEPARTMENT_OTHER): Payer: Self-pay | Admitting: Emergency Medicine

## 2016-10-15 ENCOUNTER — Emergency Department (HOSPITAL_BASED_OUTPATIENT_CLINIC_OR_DEPARTMENT_OTHER)
Admission: EM | Admit: 2016-10-15 | Discharge: 2016-10-15 | Disposition: A | Discharge status: Home / Self Care | Payer: Self-pay | Attending: Emergency Medicine | Admitting: Emergency Medicine

## 2016-10-15 ENCOUNTER — Emergency Department (HOSPITAL_BASED_OUTPATIENT_CLINIC_OR_DEPARTMENT_OTHER): Payer: Self-pay

## 2016-10-15 DIAGNOSIS — I10 Essential (primary) hypertension: Secondary | ICD-10-CM | POA: Insufficient documentation

## 2016-10-15 DIAGNOSIS — Z79899 Other long term (current) drug therapy: Secondary | ICD-10-CM | POA: Insufficient documentation

## 2016-10-15 DIAGNOSIS — J209 Acute bronchitis, unspecified: Secondary | ICD-10-CM | POA: Insufficient documentation

## 2016-10-15 DIAGNOSIS — J40 Bronchitis, not specified as acute or chronic: Secondary | ICD-10-CM

## 2016-10-15 DIAGNOSIS — F17210 Nicotine dependence, cigarettes, uncomplicated: Secondary | ICD-10-CM | POA: Insufficient documentation

## 2016-10-15 MED ORDER — AZITHROMYCIN 250 MG PO TABS: ORAL_TABLET | ORAL | 0 refills | Status: DC

## 2016-10-15 MED ORDER — BENZONATATE 100 MG PO CAPS: 200.0000 mg | ORAL_CAPSULE | Freq: Two times a day (BID) | ORAL | 0 refills | Status: DC | PRN

## 2016-10-15 NOTE — ED Triage Notes (Signed)
Patient states that he has had a cough and nasal congestion x 1 week. Reports that she has pain when he coughs, otherwise just nasal pressure

## 2016-10-15 NOTE — ED Provider Notes (Signed)
[facility]   CSN: [id number] Arrival date & time: 10/15/16  1003     History   Chief Complaint Chief Complaint  Patient presents with  . Cough    HPI Brandon Snow is a 56 y.o. male who presents with chief complaint of cough. He has had a cough for the past 7 days with associated sinus pressure, body aches and fatigue. He denies any known fevers he denies a history of wheezing. He is a current daily smoker. He states that his cough is worse at night. He has been using over-the-counter cough suppressants with minimal relief.  HPI  Past Medical History:  Diagnosis Date  . Hypertension     Patient Active Problem List   Diagnosis Date Noted  . Right knee pain 11/26/2010    History reviewed. No pertinent surgical history.     Home Medications    Prior to Admission medications   Medication Sig Start Date End Date Taking? Authorizing Provider  amoxicillin-clavulanate (AUGMENTIN) 875-125 MG tablet Take 1 tablet by mouth 2 (two) times daily. 07/08/15   Nani Ravens, MD  benzonatate (TESSALON) 100 MG capsule Take 1 capsule (100 mg total) by mouth every 8 (eight) hours. 04/06/14   Palumbo, April, MD  cetirizine-pseudoephedrine (ZYRTEC-D) 5-120 MG per tablet Take 1 tablet by mouth 2 (two) times daily. 04/06/14   Palumbo, April, MD  ibuprofen (ADVIL,MOTRIN) 800 MG tablet Take 1 tablet (800 mg total) by mouth 3 (three) times daily. 04/06/14   Palumbo, April, MD    Family History Family History  Problem Relation Age of Onset  . Diabetes Mother   . Hyperlipidemia Mother   . Hyperlipidemia Father   . Hypertension Father   . Hypertension Sister   . Hyperlipidemia Sister   . Diabetes Sister   . Diabetes Maternal Grandfather   . Diabetes Paternal Grandmother   . Hyperlipidemia Paternal Grandmother   . Hypertension Paternal Grandmother   . Sudden death Paternal Grandmother   . Heart attack Neg Hx     Social History Social History  Substance Use  Topics  . Smoking status: Current Some Day Smoker    Packs/day: 0.50    Types: Cigarettes  . Smokeless tobacco: Never Used  . Alcohol use No     Allergies   Patient has no known allergies.   Review of Systems Review of Systems  Ten systems reviewed and are negative for acute change, except as noted in the HPI.   Physical Exam Updated Vital Signs BP 128/83 (BP Location: Left Arm)   Pulse 88   Temp 98.8 F (37.1 C) (Oral)   Resp 18   Ht  (1.727 m)   Wt 124.7 kg (275 lb)   SpO2 98%   BMI 41.81 kg/m   Physical Exam  Constitutional: He appears well-developed and well-nourished. No distress.  HENT:  Head: Normocephalic and atraumatic.  Eyes: Conjunctivae are normal. No scleral icterus.  Neck: Normal range of motion. Neck supple.  Cardiovascular: Normal rate, regular rhythm and normal heart sounds.   Pulmonary/Chest: Effort normal and breath sounds normal. No respiratory distress. He has no wheezes.  Rhonchi that clear with cough  Abdominal: Soft. There is no tenderness.  Musculoskeletal: He exhibits no edema.  Neurological: He is alert.  Skin: Skin is warm and dry. He is not diaphoretic.  Psychiatric: His behavior is normal.  Nursing note and vitals reviewed.    ED Treatments / Results  Labs (all labs ordered are  listed, but only abnormal results are displayed) Labs Reviewed - No data to display  EKG  EKG Interpretation None       Radiology Dg Chest 2 View  Result Date: 10/15/2016 CLINICAL DATA:  Cough and congestion for 1 week EXAM: CHEST  2 VIEW COMPARISON:  April 06, 2014 FINDINGS: The heart, hila, and mediastinum are normal. No pneumothorax. No nodules or masses. No focal infiltrates identified. Mild interstitial prominence centrally. IMPRESSION: Mild interstitial prominence centrally may represent an atypical infection are bronchitis. No focal infiltrate. Electronically Signed   By: Gerome Sam III M.D   On: 10/15/2016 10:24     Procedures Procedures (including critical care time)  Medications Ordered in ED Medications - No data to display   Initial Impression / Assessment and Plan / ED Course  I have reviewed the triage vital signs and the nursing notes.  Pertinent labs & imaging results that were available during my care of the patient were reviewed by me and considered in my medical decision making (see chart for details).     Pt CXR negative for acute infiltrate. Patients symptoms are consistent with URI.Marland Kitchen Pt will be discharged with symptomatic treatment.  Verbalizes understanding and is agreeable with plan. Pt is hemodynamically stable & in NAD prior to dc.   Final Clinical Impressions(s) / ED Diagnoses   Final diagnoses:  None    New Prescriptions New Prescriptions   No medications on file     Arthor Captain, PA-C 10/15/16 1109    Cardama, Amadeo Garnet, MD 10/18/16 [phone]

## 2016-10-15 NOTE — Discharge Instructions (Signed)
You appear to have an upper respiratory infection (URI). An upper respiratory tract infection, or cold, is a viral infection of the air passages leading to the lungs. It is contagious and can be spread to others, especially during the first 3 or 4 days.  °RETURN IMMEDIATELY IF you develop shortness of breath, confusion or altered mental status, a new rash, become dizzy, faint, or poorly responsive, or are unable to be cared for at home. ° °

## 2018-12-22 ENCOUNTER — Other Ambulatory Visit: Payer: Self-pay

## 2018-12-22 ENCOUNTER — Encounter (HOSPITAL_BASED_OUTPATIENT_CLINIC_OR_DEPARTMENT_OTHER): Payer: Self-pay | Admitting: Emergency Medicine

## 2018-12-22 ENCOUNTER — Emergency Department (HOSPITAL_BASED_OUTPATIENT_CLINIC_OR_DEPARTMENT_OTHER)
Admission: EM | Admit: 2018-12-22 | Discharge: 2018-12-22 | Disposition: A | Discharge status: Home / Self Care | Payer: Self-pay | Attending: Emergency Medicine | Admitting: Emergency Medicine

## 2018-12-22 DIAGNOSIS — F17210 Nicotine dependence, cigarettes, uncomplicated: Secondary | ICD-10-CM | POA: Insufficient documentation

## 2018-12-22 DIAGNOSIS — I10 Essential (primary) hypertension: Secondary | ICD-10-CM | POA: Insufficient documentation

## 2018-12-22 DIAGNOSIS — G510 Bell's palsy: Secondary | ICD-10-CM | POA: Insufficient documentation

## 2018-12-22 DIAGNOSIS — Z79899 Other long term (current) drug therapy: Secondary | ICD-10-CM | POA: Insufficient documentation

## 2018-12-22 LAB — CBG MONITORING, ED: Glucose-Capillary: 87 mg/dL (ref 70–99)

## 2018-12-22 MED ORDER — VALACYCLOVIR HCL 500 MG PO TABS
1000.0000 mg | ORAL_TABLET | Freq: Once | ORAL | Status: AC
  Administered 2018-12-22: 1000 mg via ORAL
  Filled 2018-12-22: qty 2

## 2018-12-22 MED ORDER — PREDNISONE 20 MG PO TABS: ORAL_TABLET | ORAL | 0 refills | Status: DC

## 2018-12-22 MED ORDER — PREDNISONE 50 MG PO TABS
60.0000 mg | ORAL_TABLET | Freq: Once | ORAL | Status: AC
  Administered 2018-12-22: 19:00:00 60 mg via ORAL
  Filled 2018-12-22: qty 1

## 2018-12-22 MED ORDER — VALACYCLOVIR HCL 1 G PO TABS: 1000.0000 mg | ORAL_TABLET | Freq: Three times a day (TID) | ORAL | 0 refills | Status: AC

## 2018-12-22 NOTE — Discharge Instructions (Signed)
Take valtrex and prednisone as prescribed   Use eye drops to left eye during the day and eye patch at night so the eye doesn't dry out   See your doctor  Return to ER if you have worse weakness, numbness, trouble speaking
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
## 2018-12-22 NOTE — ED Notes (Signed)
Pt verbalized understanding of dc instructions.

## 2019-02-01 DIAGNOSIS — U071 COVID-19: Secondary | ICD-10-CM

## 2019-02-01 HISTORY — DX: COVID-19: U07.1

## 2019-10-29 ENCOUNTER — Ambulatory Visit (HOSPITAL_COMMUNITY)
Admission: EM | Admit: 2019-10-29 | Discharge: 2019-10-29 | Disposition: A | Discharge status: Home / Self Care | Payer: HRSA Program | Attending: Family Medicine | Admitting: Family Medicine

## 2019-10-29 ENCOUNTER — Encounter (HOSPITAL_COMMUNITY): Payer: Self-pay | Admitting: Emergency Medicine

## 2019-10-29 ENCOUNTER — Other Ambulatory Visit: Payer: Self-pay

## 2019-10-29 DIAGNOSIS — R0602 Shortness of breath: Secondary | ICD-10-CM | POA: Diagnosis present

## 2019-10-29 DIAGNOSIS — J069 Acute upper respiratory infection, unspecified: Secondary | ICD-10-CM | POA: Diagnosis not present

## 2019-10-29 DIAGNOSIS — R05 Cough: Secondary | ICD-10-CM | POA: Insufficient documentation

## 2019-10-29 DIAGNOSIS — U071 COVID-19: Secondary | ICD-10-CM | POA: Insufficient documentation

## 2019-10-29 DIAGNOSIS — I10 Essential (primary) hypertension: Secondary | ICD-10-CM | POA: Diagnosis not present

## 2019-10-29 DIAGNOSIS — F17210 Nicotine dependence, cigarettes, uncomplicated: Secondary | ICD-10-CM | POA: Diagnosis not present

## 2019-10-29 NOTE — ED Provider Notes (Signed)
[facility]    CSN: [id number] Arrival date & time: 10/29/19  1242      History   Chief Complaint Chief Complaint  Patient presents with  . Cough  . Shortness of Breath    HPI Brandon Snow is a 59 y.o. male.   Patient presents with nonproductive cough, congestion, shortness of breath x2 days.  He denies fever, chills, sore throat, vomiting, diarrhea, or other symptoms.  He has been treating his symptoms at home with Mucinex.  The history is provided by the patient.    Past Medical History:  Diagnosis Date  . Hypertension     Patient Active Problem List   Diagnosis Date Noted  . Right knee pain 11/26/2010    History reviewed. No pertinent surgical history.     Home Medications    Prior to Admission medications   Medication Sig Start Date End Date Taking? Authorizing Provider  azithromycin (ZITHROMAX Z-PAK) 250 MG tablet 2 po day one, then 1 daily x 4 days 10/15/16   Arthor Captain, PA-C  benzonatate (TESSALON) 100 MG capsule Take 2 capsules (200 mg total) by mouth 2 (two) times daily as needed for cough. 10/15/16   Arthor Captain, PA-C  ibuprofen (ADVIL,MOTRIN) 800 MG tablet Take 1 tablet (800 mg total) by mouth 3 (three) times daily. 04/06/14   Palumbo, April, MD  predniSONE (DELTASONE) 20 MG tablet Take 60 mg daily x 2 days then 40 mg daily x 2 days then 20 mg daily x 2 days 12/22/18   Charlynne Pander, MD    Family History Family History  Problem Relation Age of Onset  . Diabetes Mother   . Hyperlipidemia Mother   . Hyperlipidemia Father   . Hypertension Father   . Hypertension Sister   . Hyperlipidemia Sister   . Diabetes Sister   . Diabetes Maternal Grandfather   . Diabetes Paternal Grandmother   . Hyperlipidemia Paternal Grandmother   . Hypertension Paternal Grandmother   . Sudden death Paternal Grandmother   . Heart attack Neg Hx     Social History Social History   Tobacco Use  . Smoking status: Current Some Day Smoker     Packs/day: 0.50    Types: Cigarettes  . Smokeless tobacco: Never Used  Substance Use Topics  . Alcohol use: No  . Drug use: No     Allergies   Patient has no known allergies.   Review of Systems Review of Systems  Constitutional: Negative for chills and fever.  HENT: Positive for congestion. Negative for ear pain and sore throat.   Eyes: Negative for pain and visual disturbance.  Respiratory: Positive for cough and shortness of breath.   Cardiovascular: Negative for chest pain and palpitations.  Gastrointestinal: Negative for abdominal pain and vomiting.  Genitourinary: Negative for dysuria and hematuria.  Musculoskeletal: Negative for arthralgias and back pain.  Skin: Negative for color change and rash.  Neurological: Negative for seizures and syncope.  All other systems reviewed and are negative.    Physical Exam Triage Vital Signs ED Triage Vitals  Enc Vitals Group     BP 10/29/19 1443 135/78     Pulse Rate 10/29/19 1443 99     Resp 10/29/19 1443 20     Temp 10/29/19 1443 99 F (37.2 C)     Temp Source 10/29/19 1443 Oral     SpO2 --      Weight --      Height --  Head Circumference --      Peak Flow --      Pain Score 10/29/19 1442 0     Pain Loc --      Pain Edu? --      Excl. in GC? --    No data found.  Updated Vital Signs BP 135/78 (BP Location: Left Arm)   Pulse 99   Temp 99 F (37.2 C) (Oral)   Resp 20   SpO2 96%   Visual Acuity Right Eye Distance:   Left Eye Distance:   Bilateral Distance:    Right Eye Near:   Left Eye Near:    Bilateral Near:     Physical Exam Vitals and nursing note reviewed.  Constitutional:      General: He is not in acute distress.    Appearance: He is well-developed. He is obese. He is not ill-appearing.  HENT:     Head: Normocephalic and atraumatic.     Right Ear: Tympanic membrane normal.     Left Ear: Tympanic membrane normal.     Nose: Nose normal.     Mouth/Throat:     Mouth: Mucous membranes are  moist.     Pharynx: Oropharynx is clear.  Eyes:     Conjunctiva/sclera: Conjunctivae normal.  Cardiovascular:     Rate and Rhythm: Normal rate and regular rhythm.     Heart sounds: No murmur heard.   Pulmonary:     Effort: Pulmonary effort is normal. No respiratory distress.     Breath sounds: Normal breath sounds. No wheezing or rhonchi.  Abdominal:     Palpations: Abdomen is soft.     Tenderness: There is no abdominal tenderness. There is no guarding or rebound.  Musculoskeletal:     Cervical back: Neck supple.  Skin:    General: Skin is warm and dry.     Findings: No rash.  Neurological:     General: No focal deficit present.     Mental Status: He is alert and oriented to person, place, and time.     Gait: Gait normal.  Psychiatric:        Mood and Affect: Mood normal.        Behavior: Behavior normal.      UC Treatments / Results  Labs (all labs ordered are listed, but only abnormal results are displayed) Labs Reviewed  SARS CORONAVIRUS 2 (TAT 6-24 HRS)    EKG   Radiology No results found.  Procedures Procedures (including critical care time)  Medications Ordered in UC Medications - No data to display  Initial Impression / Assessment and Plan / UC Course  I have reviewed the triage vital signs and the nursing notes.  Pertinent labs & imaging results that were available during my care of the patient were reviewed by me and considered in my medical decision making (see chart for details).   Viral URI.  PCR COVID pending.  Instructed patient to self quarantine until the test result is back.  Discussed symptomatic treatment including Mucinex, Tylenol, rest, hydration.  Instructed patient to go to the ED if he has acute worsening symptoms.  Patient agrees to plan of care.    Final Clinical Impressions(s) / UC Diagnoses   Final diagnoses:  Viral upper respiratory tract infection     Discharge Instructions     Your COVID test is pending.  You should  self quarantine until the test result is back.    Take Tylenol as needed for fever or discomfort.  Rest and keep yourself hydrated.    Go to the emergency department if you develop acute worsening symptoms.        ED Prescriptions    None     PDMP not reviewed this encounter.   Mickie Bail, NP 10/29/19 1506

## 2019-10-29 NOTE — Discharge Instructions (Addendum)
Your COVID test is pending.  You should self quarantine until the test result is back.    Take Tylenol as needed for fever or discomfort.  Rest and keep yourself hydrated.    Go to the emergency department if you develop acute worsening symptoms.     

## 2019-10-29 NOTE — ED Triage Notes (Signed)
Pt presents with SOB and dry cough xs 2 days.

## 2019-10-30 ENCOUNTER — Emergency Department (HOSPITAL_BASED_OUTPATIENT_CLINIC_OR_DEPARTMENT_OTHER): Payer: HRSA Program

## 2019-10-30 ENCOUNTER — Inpatient Hospital Stay (HOSPITAL_BASED_OUTPATIENT_CLINIC_OR_DEPARTMENT_OTHER)
Admission: EM | Admit: 2019-10-30 | Discharge: 2019-11-14 | DRG: 177 | Disposition: A | Discharge status: Home Health | Payer: HRSA Program | Attending: Internal Medicine | Admitting: Internal Medicine

## 2019-10-30 ENCOUNTER — Other Ambulatory Visit: Payer: Self-pay

## 2019-10-30 ENCOUNTER — Encounter (HOSPITAL_BASED_OUTPATIENT_CLINIC_OR_DEPARTMENT_OTHER): Payer: Self-pay | Admitting: Emergency Medicine

## 2019-10-30 DIAGNOSIS — J1282 Pneumonia due to coronavirus disease 2019: Secondary | ICD-10-CM | POA: Diagnosis present

## 2019-10-30 DIAGNOSIS — J9601 Acute respiratory failure with hypoxia: Secondary | ICD-10-CM

## 2019-10-30 DIAGNOSIS — I5032 Chronic diastolic (congestive) heart failure: Secondary | ICD-10-CM | POA: Diagnosis present

## 2019-10-30 DIAGNOSIS — Z87891 Personal history of nicotine dependence: Secondary | ICD-10-CM

## 2019-10-30 DIAGNOSIS — I110 Hypertensive heart disease with heart failure: Secondary | ICD-10-CM | POA: Diagnosis present

## 2019-10-30 DIAGNOSIS — R6520 Severe sepsis without septic shock: Secondary | ICD-10-CM | POA: Diagnosis present

## 2019-10-30 DIAGNOSIS — J80 Acute respiratory distress syndrome: Secondary | ICD-10-CM | POA: Diagnosis present

## 2019-10-30 DIAGNOSIS — R7401 Elevation of levels of liver transaminase levels: Secondary | ICD-10-CM

## 2019-10-30 DIAGNOSIS — R0602 Shortness of breath: Secondary | ICD-10-CM

## 2019-10-30 DIAGNOSIS — I10 Essential (primary) hypertension: Secondary | ICD-10-CM

## 2019-10-30 DIAGNOSIS — A4189 Other specified sepsis: Secondary | ICD-10-CM | POA: Diagnosis present

## 2019-10-30 DIAGNOSIS — R040 Epistaxis: Secondary | ICD-10-CM | POA: Diagnosis not present

## 2019-10-30 DIAGNOSIS — Z23 Encounter for immunization: Secondary | ICD-10-CM

## 2019-10-30 DIAGNOSIS — K5900 Constipation, unspecified: Secondary | ICD-10-CM | POA: Diagnosis not present

## 2019-10-30 DIAGNOSIS — A419 Sepsis, unspecified organism: Secondary | ICD-10-CM

## 2019-10-30 DIAGNOSIS — U071 COVID-19: Principal | ICD-10-CM | POA: Diagnosis present

## 2019-10-30 DIAGNOSIS — B370 Candidal stomatitis: Secondary | ICD-10-CM | POA: Diagnosis not present

## 2019-10-30 LAB — C-REACTIVE PROTEIN: CRP: 10.4 mg/dL — ABNORMAL HIGH (ref <1.0)

## 2019-10-30 LAB — CBC WITH DIFFERENTIAL/PLATELET
Abs Immature Granulocytes: 0.02 10*3/uL (ref 0.00–0.07)
Basophils Absolute: 0 10*3/uL (ref 0.0–0.1)
Basophils Relative: 0 %
Eosinophils Absolute: 0 10*3/uL (ref 0.0–0.5)
Eosinophils Relative: 0 %
HCT: 43.1 % (ref 39.0–52.0)
Hemoglobin: 14.3 g/dL (ref 13.0–17.0)
Immature Granulocytes: 0 %
Lymphocytes Relative: 16 %
Lymphs Abs: 1 10*3/uL (ref 0.7–4.0)
MCH: 31.8 pg (ref 26.0–34.0)
MCHC: 33.2 g/dL (ref 30.0–36.0)
MCV: 95.8 fL (ref 80.0–100.0)
Monocytes Absolute: 0.3 10*3/uL (ref 0.1–1.0)
Monocytes Relative: 4 %
Neutro Abs: 4.8 10*3/uL (ref 1.7–7.7)
Neutrophils Relative %: 80 %
Platelets: 159 10*3/uL (ref 150–400)
RBC: 4.5 MIL/uL (ref 4.22–5.81)
RDW: 11.8 % (ref 11.5–15.5)
WBC: 6.1 10*3/uL (ref 4.0–10.5)
nRBC: 0 % (ref 0.0–0.2)

## 2019-10-30 LAB — COMPREHENSIVE METABOLIC PANEL
ALT: 45 U/L — ABNORMAL HIGH (ref 0–44)
AST: 101 U/L — ABNORMAL HIGH (ref 15–41)
Albumin: 3.3 g/dL — ABNORMAL LOW (ref 3.5–5.0)
Alkaline Phosphatase: 95 U/L (ref 38–126)
Anion gap: 11 (ref 5–15)
BUN: 9 mg/dL (ref 6–20)
CO2: 25 mmol/L (ref 22–32)
Calcium: 8.2 mg/dL — ABNORMAL LOW (ref 8.9–10.3)
Chloride: 98 mmol/L (ref 98–111)
Creatinine, Ser: 0.98 mg/dL (ref 0.61–1.24)
GFR calc Af Amer: >60 mL/min (ref >60)
GFR calc non Af Amer: >60 mL/min (ref >60)
Glucose, Bld: 114 mg/dL — ABNORMAL HIGH (ref 70–99)
Potassium: 4 mmol/L (ref 3.5–5.1)
Sodium: 134 mmol/L — ABNORMAL LOW (ref 135–145)
Total Bilirubin: 1.1 mg/dL (ref 0.3–1.2)
Total Protein: 7.5 g/dL (ref 6.5–8.1)

## 2019-10-30 LAB — D-DIMER, QUANTITATIVE: D-Dimer, Quant: 3.92 ug/mL-FEU — ABNORMAL HIGH (ref 0.00–0.50)

## 2019-10-30 LAB — FIBRINOGEN: Fibrinogen: 646 mg/dL — ABNORMAL HIGH (ref 210–475)

## 2019-10-30 LAB — SARS CORONAVIRUS 2 (TAT 6-24 HRS): SARS Coronavirus 2: POSITIVE — AB

## 2019-10-30 LAB — LACTATE DEHYDROGENASE: LDH: 930 U/L — ABNORMAL HIGH (ref 98–192)

## 2019-10-30 LAB — TRIGLYCERIDES: Triglycerides: 120 mg/dL (ref <150)

## 2019-10-30 LAB — FERRITIN: Ferritin: 3423 ng/mL — ABNORMAL HIGH (ref 24–336)

## 2019-10-30 LAB — LACTIC ACID, PLASMA: Lactic Acid, Venous: 1.3 mmol/L (ref 0.5–1.9)

## 2019-10-30 LAB — PROCALCITONIN: Procalcitonin: <0.1 ng/mL

## 2019-10-30 MED ORDER — SODIUM CHLORIDE 0.9 % IV SOLN
100.0000 mg | Freq: Every day | INTRAVENOUS | Status: AC
  Administered 2019-10-31 – 2019-11-03 (×4): 100 mg via INTRAVENOUS
  Filled 2019-10-30 (×4): qty 20

## 2019-10-30 MED ORDER — ENOXAPARIN SODIUM 40 MG/0.4ML ~~LOC~~ SOLN
40.0000 mg | SUBCUTANEOUS | Status: DC
  Administered 2019-10-30 – 2019-10-31 (×2): 40 mg via SUBCUTANEOUS
  Filled 2019-10-30 (×2): qty 0.4

## 2019-10-30 MED ORDER — DEXAMETHASONE SODIUM PHOSPHATE 10 MG/ML IJ SOLN
10.0000 mg | Freq: Once | INTRAMUSCULAR | Status: AC
  Administered 2019-10-30: 10 mg via INTRAVENOUS
  Filled 2019-10-30: qty 1

## 2019-10-30 MED ORDER — ALBUTEROL SULFATE HFA 108 (90 BASE) MCG/ACT IN AERS
2.0000 | INHALATION_SPRAY | Freq: Once | RESPIRATORY_TRACT | Status: AC
  Administered 2019-10-30: 2 via RESPIRATORY_TRACT
  Filled 2019-10-30: qty 6.7

## 2019-10-30 MED ORDER — IOHEXOL 350 MG/ML SOLN
100.0000 mL | Freq: Once | INTRAVENOUS | Status: AC | PRN
  Administered 2019-10-30: 100 mL via INTRAVENOUS

## 2019-10-30 MED ORDER — SODIUM CHLORIDE 0.9 % IV SOLN
100.0000 mg | Freq: Once | INTRAVENOUS | Status: AC
  Administered 2019-10-30: 100 mg via INTRAVENOUS
  Filled 2019-10-30: qty 20

## 2019-10-30 NOTE — ED Triage Notes (Signed)
Per EMS:  Pt has covid, having worsening cough and sob at home.  sats 77% on RA, 94% on 2L

## 2019-10-30 NOTE — Progress Notes (Signed)
Weaned patient's HFNC down to 12 liters per minute at 20:01 10/30/2019.  Patient's SPO2 is 95%.  RT will continue to monitor.

## 2019-10-30 NOTE — ED Provider Notes (Signed)
[facility] EMERGENCY DEPARTMENT Provider Note   CSN: [id number] Arrival date & time: 10/30/19  1841     History Chief Complaint  Patient presents with  . Cough  . Shortness of Breath    Brandon Snow is a 59 y.o. male history of hypertension here presenting with shortness of breath and positive Covid.  Patient states that for the last 3 to 4 days he has been having subjective shortness of breath.  Denies any fevers or chills.  He states that he is achy all over.  Went to urgent care and had a Covid test that came back positive.  He states that he has worsening shortness of breath and cough today so called EMS.  He was noted to have oxygen saturation of 77% on room air.  He is not on oxygen at baseline.  Denies any underlying lung disease or heart disease.  The history is provided by the patient.       Past Medical History:  Diagnosis Date  . Hypertension     Patient Active Problem List   Diagnosis Date Noted  . Right knee pain 11/26/2010    No past surgical history on file.     Family History  Problem Relation Age of Onset  . Diabetes Mother   . Hyperlipidemia Mother   . Hyperlipidemia Father   . Hypertension Father   . Hypertension Sister   . Hyperlipidemia Sister   . Diabetes Sister   . Diabetes Maternal Grandfather   . Diabetes Paternal Grandmother   . Hyperlipidemia Paternal Grandmother   . Hypertension Paternal Grandmother   . Sudden death Paternal Grandmother   . Heart attack Neg Hx     Social History   Tobacco Use  . Smoking status: Current Some Day Smoker    Packs/day: 0.50    Types: Cigarettes  . Smokeless tobacco: Never Used  Substance Use Topics  . Alcohol use: No  . Drug use: No    Home Medications Prior to Admission medications   Medication Sig Start Date End Date Taking? Authorizing Provider  azithromycin (ZITHROMAX Z-PAK) 250 MG tablet 2 po day one, then 1 daily x 4 days 10/15/16   Arthor Captain, PA-C  benzonatate  (TESSALON) 100 MG capsule Take 2 capsules (200 mg total) by mouth 2 (two) times daily as needed for cough. 10/15/16   Arthor Captain, PA-C  ibuprofen (ADVIL,MOTRIN) 800 MG tablet Take 1 tablet (800 mg total) by mouth 3 (three) times daily. 04/06/14   Palumbo, April, MD  predniSONE (DELTASONE) 20 MG tablet Take 60 mg daily x 2 days then 40 mg daily x 2 days then 20 mg daily x 2 days 12/22/18   Charlynne Pander, MD    Allergies    Patient has no known allergies.  Review of Systems   Review of Systems  Respiratory: Positive for cough and shortness of breath.   All other systems reviewed and are negative.   Physical Exam Updated Vital Signs BP 134/82 (BP Location: Left Arm)   Pulse (!) 103   Resp (!) 22   SpO2 98%   Physical Exam Vitals and nursing note reviewed.  Constitutional:      Comments: tachypneic   HENT:     Head: Normocephalic.     Mouth/Throat:     Mouth: Mucous membranes are moist.  Eyes:     Extraocular Movements: Extraocular movements intact.     Pupils: Pupils are equal, round, and reactive to light.  Cardiovascular:  Rate and Rhythm: Regular rhythm. Tachycardia present.  Pulmonary:     Comments: Tachypneic and dry crackles bilateral lung bases. Abdominal:     General: Bowel sounds are normal.     Palpations: Abdomen is soft.  Musculoskeletal:        General: Normal range of motion.     Cervical back: Normal range of motion.  Skin:    General: Skin is warm.     Capillary Refill: Capillary refill takes less than 2 seconds.  Neurological:     General: No focal deficit present.     Mental Status: He is oriented to person, place, and time.  Psychiatric:        Mood and Affect: Mood normal.        Behavior: Behavior normal.     ED Results / Procedures / Treatments   Labs (all labs ordered are listed, but only abnormal results are displayed) Labs Reviewed  COMPREHENSIVE METABOLIC PANEL - Abnormal; Notable for the following components:      Result  Value   Sodium 134 (*)    Glucose, Bld 114 (*)    Calcium 8.2 (*)    Albumin 3.3 (*)    AST 101 (*)    ALT 45 (*)    All other components within normal limits  D-DIMER, QUANTITATIVE (NOT AT [facility]) - Abnormal; Notable for the following components:   D-Dimer, Quant 3.92 (*)    All other components within normal limits  CULTURE, BLOOD (ROUTINE X 2)  CULTURE, BLOOD (ROUTINE X 2)  LACTIC ACID, PLASMA  CBC WITH DIFFERENTIAL/PLATELET  LACTIC ACID, PLASMA  PROCALCITONIN  LACTATE DEHYDROGENASE  FERRITIN  TRIGLYCERIDES  FIBRINOGEN  C-REACTIVE PROTEIN    EKG EKG Interpretation  Date/Time:  Wednesday October 30 2019 18:50:19 EDT Ventricular Rate:  108 PR Interval:    QRS Duration: 100 QT Interval:  313 QTC Calculation: 420 R Axis:   -18 Text Interpretation: Sinus tachycardia Borderline left axis deviation Abnormal R-wave progression, early transition No previous ECGs available Confirmed by Richardean Canal ([id number]) on 10/30/2019 7:30:43 PM   Radiology DG Chest Port 1 View  Result Date: 10/30/2019 CLINICAL DATA:  Shortness of breath.  COVID. EXAM: PORTABLE CHEST 1 VIEW COMPARISON:  10/15/2016 FINDINGS: Multifocal airspace opacities throughout the right greater than left lungs. No visible pleural effusions. No pneumothorax. Cardiac silhouette is accentuated by low lung volumes and portable technique. No acute osseous abnormality. IMPRESSION: Multifocal right greater than left airspace opacities, compatible with multifocal pneumonia and reported history of COVID. Electronically Signed   By: Feliberto Harts MD   On: 10/30/2019 19:36    Procedures Procedures (including critical care time)  CRITICAL CARE Performed by: Richardean Canal   Total critical care time: 30 minutes  Critical care time was exclusive of separately billable procedures and treating other patients.  Critical care was necessary to treat or prevent imminent or life-threatening deterioration.  Critical care was time  spent personally by me on the following activities: development of treatment plan with patient and/or surrogate as well as nursing, discussions with consultants, evaluation of patient's response to treatment, examination of patient, obtaining history from patient or surrogate, ordering and performing treatments and interventions, ordering and review of laboratory studies, ordering and review of radiographic studies, pulse oximetry and re-evaluation of patient's condition.   Medications Ordered in ED Medications  remdesivir 100 mg in sodium chloride 0.9 % 100 mL IVPB (100 mg Intravenous New Bag/Given 10/30/19 1934)    Followed by  remdesivir 100 mg in sodium chloride 0.9 % 100 mL IVPB (has no administration in time range)  remdesivir 100 mg in sodium chloride 0.9 % 100 mL IVPB (has no administration in time range)  dexamethasone (DECADRON) injection 10 mg (10 mg Intravenous Given 10/30/19 1915)  albuterol (VENTOLIN HFA) 108 (90 Base) MCG/ACT inhaler 2 puff (2 puffs Inhalation Given 10/30/19 1954)    ED Course  I have reviewed the triage vital signs and the nursing notes.  Pertinent labs & imaging results that were available during my care of the patient were reviewed by me and considered in my medical decision making (see chart for details).    MDM Rules/Calculators/A&P                          LORRIE STRAUCH is a 59 y.o. male here presenting with shortness of breath.  Patient tested positive for Covid yesterday.  He did not receive the Covid vaccine.  Patient was hypoxic per EMS.  He is on 3 L nasal cannula oxygen saturation around 90%.  Plan to get Covid preadmission labs and chest x-ray.  We will also dose remdesivir and steroids.  8:07 PM Patient was initially on 3 L but keeps on the desatting into the 80s.  Patient is put on high flow nasal cannula and oxygen saturations 98%.  His inflammatory markers are elevated.  Ordered steroids and remdesivir.  Plan to admit for hypoxia from  Covid.  11:00 PM Hospitalist request CTA which did not show any PE.  Will be admitted for hypoxia from Covid.   Final Clinical Impression(s) / ED Diagnoses Final diagnoses:  None    Rx / DC Orders ED Discharge Orders    None       Charlynne Pander, MD 10/30/19 [phone]

## 2019-10-31 DIAGNOSIS — Z23 Encounter for immunization: Secondary | ICD-10-CM | POA: Diagnosis not present

## 2019-10-31 DIAGNOSIS — B370 Candidal stomatitis: Secondary | ICD-10-CM | POA: Diagnosis not present

## 2019-10-31 DIAGNOSIS — J80 Acute respiratory distress syndrome: Secondary | ICD-10-CM | POA: Diagnosis present

## 2019-10-31 DIAGNOSIS — R7989 Other specified abnormal findings of blood chemistry: Secondary | ICD-10-CM | POA: Diagnosis not present

## 2019-10-31 DIAGNOSIS — K5900 Constipation, unspecified: Secondary | ICD-10-CM | POA: Diagnosis not present

## 2019-10-31 DIAGNOSIS — J9601 Acute respiratory failure with hypoxia: Secondary | ICD-10-CM

## 2019-10-31 DIAGNOSIS — U071 COVID-19: Secondary | ICD-10-CM

## 2019-10-31 DIAGNOSIS — I10 Essential (primary) hypertension: Secondary | ICD-10-CM | POA: Diagnosis not present

## 2019-10-31 DIAGNOSIS — A419 Sepsis, unspecified organism: Secondary | ICD-10-CM

## 2019-10-31 DIAGNOSIS — R6520 Severe sepsis without septic shock: Secondary | ICD-10-CM | POA: Diagnosis present

## 2019-10-31 DIAGNOSIS — R7401 Elevation of levels of liver transaminase levels: Secondary | ICD-10-CM

## 2019-10-31 DIAGNOSIS — I110 Hypertensive heart disease with heart failure: Secondary | ICD-10-CM | POA: Diagnosis present

## 2019-10-31 DIAGNOSIS — R040 Epistaxis: Secondary | ICD-10-CM | POA: Diagnosis not present

## 2019-10-31 DIAGNOSIS — I5032 Chronic diastolic (congestive) heart failure: Secondary | ICD-10-CM | POA: Diagnosis present

## 2019-10-31 DIAGNOSIS — J1282 Pneumonia due to coronavirus disease 2019: Secondary | ICD-10-CM | POA: Diagnosis present

## 2019-10-31 DIAGNOSIS — A4189 Other specified sepsis: Secondary | ICD-10-CM | POA: Diagnosis present

## 2019-10-31 DIAGNOSIS — I5031 Acute diastolic (congestive) heart failure: Secondary | ICD-10-CM | POA: Diagnosis not present

## 2019-10-31 DIAGNOSIS — Z87891 Personal history of nicotine dependence: Secondary | ICD-10-CM | POA: Diagnosis not present

## 2019-10-31 MED ORDER — PREDNISONE 20 MG PO TABS: 50.0000 mg | ORAL_TABLET | Freq: Every day | ORAL | Status: DC

## 2019-10-31 MED ORDER — HYDROCOD POLST-CPM POLST ER 10-8 MG/5ML PO SUER
5.0000 mL | Freq: Two times a day (BID) | ORAL | Status: DC | PRN
  Administered 2019-11-07 – 2019-11-09 (×3): 5 mL via ORAL
  Filled 2019-10-31 (×3): qty 5

## 2019-10-31 MED ORDER — METHYLPREDNISOLONE SODIUM SUCC 125 MG IJ SOLR
0.5000 mg/kg | Freq: Two times a day (BID) | INTRAMUSCULAR | Status: DC
  Administered 2019-10-31: 65 mg via INTRAVENOUS
  Filled 2019-10-31: qty 2

## 2019-10-31 MED ORDER — GUAIFENESIN-DM 100-10 MG/5ML PO SYRP
10.0000 mL | ORAL_SOLUTION | ORAL | Status: DC | PRN
  Administered 2019-11-07 – 2019-11-13 (×10): 10 mL via ORAL
  Filled 2019-10-31 (×10): qty 10

## 2019-10-31 MED ORDER — ASCORBIC ACID 500 MG PO TABS
500.0000 mg | ORAL_TABLET | Freq: Every day | ORAL | Status: DC
  Administered 2019-10-31 – 2019-11-14 (×15): 500 mg via ORAL
  Filled 2019-10-31 (×15): qty 1

## 2019-10-31 MED ORDER — SODIUM CHLORIDE 0.9 % IV SOLN: INTRAVENOUS | Status: DC | PRN

## 2019-10-31 MED ORDER — NICOTINE 14 MG/24HR TD PT24
14.0000 mg | MEDICATED_PATCH | Freq: Every day | TRANSDERMAL | Status: DC
  Administered 2019-10-31 – 2019-11-14 (×15): 14 mg via TRANSDERMAL
  Filled 2019-10-31 (×15): qty 1

## 2019-10-31 MED ORDER — ENOXAPARIN SODIUM 30 MG/0.3ML ~~LOC~~ SOLN
30.0000 mg | Freq: Once | SUBCUTANEOUS | Status: AC
  Administered 2019-10-31: 30 mg via SUBCUTANEOUS
  Filled 2019-10-31: qty 0.3

## 2019-10-31 MED ORDER — LABETALOL HCL 5 MG/ML IV SOLN: 5.0000 mg | INTRAVENOUS | Status: DC | PRN

## 2019-10-31 MED ORDER — BARICITINIB 2 MG PO TABS
4.0000 mg | ORAL_TABLET | Freq: Every day | ORAL | Status: AC
  Administered 2019-10-31 – 2019-11-13 (×14): 4 mg via ORAL
  Filled 2019-10-31 (×14): qty 2

## 2019-10-31 MED ORDER — PNEUMOCOCCAL VAC POLYVALENT 25 MCG/0.5ML IJ INJ
0.5000 mL | INJECTION | INTRAMUSCULAR | Status: DC
  Filled 2019-10-31: qty 0.5

## 2019-10-31 MED ORDER — ENOXAPARIN SODIUM 80 MG/0.8ML ~~LOC~~ SOLN: 65.0000 mg | Freq: Every day | SUBCUTANEOUS | Status: DC

## 2019-10-31 MED ORDER — ACETAMINOPHEN 325 MG PO TABS
650.0000 mg | ORAL_TABLET | Freq: Four times a day (QID) | ORAL | Status: DC | PRN
  Administered 2019-11-04 – 2019-11-14 (×8): 650 mg via ORAL
  Filled 2019-10-31 (×8): qty 2

## 2019-10-31 MED ORDER — ZINC SULFATE 220 (50 ZN) MG PO CAPS
220.0000 mg | ORAL_CAPSULE | Freq: Every day | ORAL | Status: DC
  Administered 2019-10-31 – 2019-11-14 (×15): 220 mg via ORAL
  Filled 2019-10-31 (×14): qty 1

## 2019-10-31 MED ORDER — ALBUTEROL SULFATE HFA 108 (90 BASE) MCG/ACT IN AERS: 2.0000 | INHALATION_SPRAY | RESPIRATORY_TRACT | Status: DC | PRN

## 2019-10-31 MED ORDER — INFLUENZA VAC SPLIT QUAD 0.5 ML IM SUSY
0.5000 mL | PREFILLED_SYRINGE | INTRAMUSCULAR | Status: DC
  Filled 2019-10-31 (×2): qty 0.5

## 2019-10-31 MED ORDER — LIP MEDEX EX OINT
TOPICAL_OINTMENT | CUTANEOUS | Status: DC | PRN
  Filled 2019-10-31: qty 7

## 2019-10-31 MED ORDER — VITAMIN D 25 MCG (1000 UNIT) PO TABS
1000.0000 [IU] | ORAL_TABLET | Freq: Every day | ORAL | Status: DC
  Administered 2019-10-31 – 2019-11-14 (×15): 1000 [IU] via ORAL
  Filled 2019-10-31 (×15): qty 1

## 2019-10-31 NOTE — ED Notes (Signed)
Pt stating he is tired of laying in one spot, allowed pt to sit on side of bed. This RN monitored SpO2 during movement, slight desaturation, however increased back to 95% upon sitting on side of bed. Pt states "this feels much better". Allowing pt to remain sitting on side of bed at this time, informed to call when he wants to lay back down.

## 2019-10-31 NOTE — ED Notes (Signed)
Pt put on 15L HFNC; RT at bedside

## 2019-10-31 NOTE — Plan of Care (Signed)
  Problem: Education: Goal: Knowledge of General Education information will improve Description: Including pain rating scale, medication(s)/side effects and non-pharmacologic comfort measures Outcome: Progressing   Problem: Health Behavior/Discharge Planning: Goal: Ability to manage health-related needs will improve Outcome: Progressing   Problem: Clinical Measurements: Goal: Respiratory complications will improve Outcome: Progressing   Problem: Activity: Goal: Risk for activity intolerance will decrease Outcome: Progressing   Problem: Nutrition: Goal: Adequate nutrition will be maintained Outcome: Progressing   Problem: Education: Goal: Knowledge of risk factors and measures for prevention of condition will improve Outcome: Progressing   Problem: Respiratory: Goal: Will maintain a patent airway Outcome: Progressing

## 2019-10-31 NOTE — H&P (Addendum)
History and Physical    [name] MCN:[id number] DOB: 1960/06/23 DOA: 10/30/2019  PCP: Brandon Snow coming from: [facility]  Chief Complaint: Shortness of breath, Covid positive  HPI: Brandon Snow is a 59 y.o. male with medical history significant of hypertension presented to the ED on 9/29 via EMS for evaluation of shortness of breath and cough.  Oxygen saturation 77% on room air with EMS, improved to 94% on 2 L supplemental oxygen.  Snow was seen at urgent care on 9/28 for viral URI symptoms and was discharged home to self quarantine, Covid test results came back positive.  He has not been vaccinated against Covid.  Snow states he has been feeling ill for the past 4 days.  Symptoms include cough, shortness of breath, body aches, and fatigue.  Denies chest pain, nausea, vomiting, abdominal pain, or diarrhea.  ED Course: Afebrile.  Slightly tachycardic.  Tachypneic with respiratory rate in the 20s to 30s.  Desatted to the 80s and required 15 L oxygen via high flow nasal cannula.  WBC 6.1, hemoglobin 14.3, hematocrit 43.1, platelet 159K.  Sodium 134, potassium 4.0, chloride 98, bicarb 25, BUN 9, creatinine 0.9, glucose 114.  AST 101, ALT 45, alk phos 95, T bili 1.1.  Lactic acid 1.3.  Blood culture showing NGTD.  Procalcitonin <0.10. Inflammatory markers elevated: D-dimer 3.92, LDH 930, ferritin 3423, fibrinogen 646, CRP 10.4.  Chest x-ray showing evidence of multifocal pneumonia.  CT angiogram chest negative for PE and showing findings consistent with COVID-19 multifocal pneumonia. Snow was given Decadron and remdesivir.  Review of Systems:  All systems reviewed and apart from history of presenting illness, are negative.  Past Medical History:  Diagnosis Date  . Hypertension     No past surgical history on file.   reports that he has been smoking cigarettes. He has been smoking about 0.50 packs per day. He has never used smokeless tobacco. He reports that he does  not drink alcohol and does not use drugs.  No Known Allergies  Family History  Problem Relation Age of Onset  . Diabetes Mother   . Hyperlipidemia Mother   . Hyperlipidemia Father   . Hypertension Father   . Hypertension Sister   . Hyperlipidemia Sister   . Diabetes Sister   . Diabetes Maternal Grandfather   . Diabetes Paternal Grandmother   . Hyperlipidemia Paternal Grandmother   . Hypertension Paternal Grandmother   . Sudden death Paternal Grandmother   . Heart attack Neg Hx     Prior to Admission medications   Medication Sig Start Date End Date Taking? Authorizing Provider  Ascorbic Acid (VITAMIN C) 1000 MG tablet Take 1,000 mg by mouth daily.   Yes [provider]  Multiple Vitamin (MULTIVITAMIN) tablet Take 1 tablet by mouth daily.   Yes [provider]  naproxen sodium (ALEVE) 220 MG tablet Take 660 mg by mouth.   Yes [provider]  tetrahydrozoline-zinc (VISINE-AC) 0.05-0.25 % ophthalmic solution Place 1 drop into both eyes 3 (three) times daily as needed (dry eyes).   Yes [provider]  azithromycin (ZITHROMAX Z-PAK) 250 MG tablet 2 po day one, then 1 daily x 4 days 10/15/16   Margarita Mail, PA-C  benzonatate (TESSALON) 100 MG capsule Take 2 capsules (200 mg total) by mouth 2 (two) times daily as needed for cough. 10/15/16   Margarita Mail, PA-C  ibuprofen (ADVIL,MOTRIN) 800 MG tablet Take 1 tablet (800 mg total) by mouth 3 (three) times daily.  Snow not taking: Reported on 10/31/2019 04/06/14   Palumbo, April, MD  predniSONE (DELTASONE) 20 MG tablet Take 60 mg daily x 2 days then 40 mg daily x 2 days then 20 mg daily x 2 days 12/22/18   Drenda Freeze, MD    Physical Exam: Vitals:   10/31/19 1700 10/31/19 1730 10/31/19 2004 10/31/19 2011  BP: (!) 128/91 129/77 (!) 153/102 (!) 162/83  Pulse: (!) 103 100 (!) 101 99  Resp: (!) 25 15 (!) 22 (!) 22  Temp:   98.5 F (36.9 C) 98.5 F (36.9 C)  TempSrc:   Oral Oral  SpO2: 95%  99% 90% 91%  Weight:   130.5 kg   Height:   5' 9"  (1.753 m)     Physical Exam Constitutional:      General: He is not in acute distress. HENT:     Head: Normocephalic and atraumatic.  Eyes:     Extraocular Movements: Extraocular movements intact.     Conjunctiva/sclera: Conjunctivae normal.  Cardiovascular:     Rate and Rhythm: Regular rhythm. Tachycardia present.     Pulses: Normal pulses.     Comments: Tachycardic with heart rate in the 110s Pulmonary:     Effort: Respiratory distress present.     Breath sounds: Rales present. No wheezing.     Comments: Tachypneic with respiratory rate in the 30s Abdominal:     General: Bowel sounds are normal. There is no distension.     Palpations: Abdomen is soft.     Tenderness: There is no abdominal tenderness.  Musculoskeletal:        General: No swelling or tenderness.     Cervical back: Normal range of motion and neck supple.  Skin:    General: Skin is warm and dry.  Neurological:     General: No focal deficit present.     Mental Status: He is alert and oriented to person, place, and time.     Labs on Admission: I have personally reviewed following labs and imaging studies  CBC: Recent Labs  Lab 10/30/19 1918  WBC 6.1  NEUTROABS 4.8  HGB 14.3  HCT 43.1  MCV 95.8  PLT 768   Basic Metabolic Panel: Recent Labs  Lab 10/30/19 1918  NA 134*  K 4.0  CL 98  CO2 25  GLUCOSE 114*  BUN 9  CREATININE 0.98  CALCIUM 8.2*   GFR: Estimated Creatinine Clearance: 112.6 mL/min (by C-G formula based on SCr of 0.98 mg/dL). Liver Function Tests: Recent Labs  Lab 10/30/19 1918  AST 101*  ALT 45*  ALKPHOS 95  BILITOT 1.1  PROT 7.5  ALBUMIN 3.3*   No results for input(s): LIPASE, AMYLASE in the last 168 hours. No results for input(s): AMMONIA in the last 168 hours. Coagulation Profile: No results for input(s): INR, PROTIME in the last 168 hours. Cardiac Enzymes: No results for input(s): CKTOTAL, CKMB, CKMBINDEX,  TROPONINI in the last 168 hours. BNP (last 3 results) No results for input(s): PROBNP in the last 8760 hours. HbA1C: No results for input(s): HGBA1C in the last 72 hours. CBG: No results for input(s): GLUCAP in the last 168 hours. Lipid Profile: Recent Labs    10/30/19 1918  TRIG 120   Thyroid Function Tests: No results for input(s): TSH, T4TOTAL, FREET4, T3FREE, THYROIDAB in the last 72 hours. Anemia Panel: Recent Labs    10/30/19 1918  FERRITIN 3,423*   Urine analysis: No results found for: COLORURINE, APPEARANCEUR, [place], [place], [place], [place],  BILIRUBINUR, KETONESUR, PROTEINUR, UROBILINOGEN, NITRITE, LEUKOCYTESUR  Radiological Exams on Admission: CT Angio Chest PE W and/or Wo Contrast  Result Date: 10/30/2019 CLINICAL DATA:  COVID-19 positive, worsening shortness of breath at home, O2 sat 77% on room air EXAM: CT ANGIOGRAPHY CHEST WITH CONTRAST TECHNIQUE: Multidetector CT imaging of the chest was performed using the standard protocol during bolus administration of intravenous contrast. Multiplanar CT image reconstructions and MIPs were obtained to evaluate the vascular anatomy. CONTRAST:  163m OMNIPAQUE IOHEXOL 350 MG/ML SOLN COMPARISON:  Radiograph 10/30/2019 FINDINGS: Cardiovascular: Satisfactory opacification the pulmonary arteries to the segmental level. No pulmonary artery filling defects are identified. Central pulmonary arteries are normal caliber. The aortic root is suboptimally assessed given cardiac pulsation artifact. The aorta is normal caliber. No acute luminal abnormality of the imaged aorta. No periaortic stranding or hemorrhage. Normal 3 vessel branching of the aortic arch. Proximal great vessels are unremarkable. Normal heart size. No pericardial effusion. No major venous abnormalities. Mediastinum/Nodes: No mediastinal fluid or gas. Normal thyroid gland and thoracic inlet. No acute abnormality of the trachea or esophagus. Scattered borderline enlarged nodes with  preserved nodal architecture within the mediastinum and hila for instance a 12 mm left paratracheal node (4/37), 10 mm right paratracheal node (4/37), and an 11 mm right hilar node (4/51) are favored to be reactive. No worrisome enlarged mediastinal, hilar or axillary adenopathy. Lungs/Pleura: Multifocal areas of mixed consolidative and ground-glass opacity with interstitial/septal thickening in a crazy paving pattern which is an often reported imaging feature COVID 19 among other infectious and inflammatory etiologies. No pneumothorax or effusion. Mild diffuse airways thickening. Upper Abdomen: No acute abnormalities present in the visualized portions of the upper abdomen. Musculoskeletal: No acute osseous abnormality or suspicious osseous lesion. Multilevel degenerative changes are present in the imaged portions of the spine. No worrisome chest wall lesions. Review of the MIP images confirms the above findings. IMPRESSION: 1. No evidence of pulmonary embolism. 2. Multifocal areas of mixed consolidative and ground-glass opacity with interstitial/septal thickening compatible with reported imaging features of COVID 19. 3. Scattered borderline enlarged nodes within the mediastinum and hila, favored to be reactive. Electronically Signed   By: PLovena LeM.D.   On: 10/30/2019 20:59   DG Chest Port 1 View  Result Date: 10/30/2019 CLINICAL DATA:  Shortness of breath.  COVID. EXAM: PORTABLE CHEST 1 VIEW COMPARISON:  10/15/2016 FINDINGS: Multifocal airspace opacities throughout the right greater than left lungs. No visible pleural effusions. No pneumothorax. Cardiac silhouette is accentuated by low lung volumes and portable technique. No acute osseous abnormality. IMPRESSION: Multifocal right greater than left airspace opacities, compatible with multifocal pneumonia and reported history of COVID. Electronically Signed   By: FMargaretha SheffieldMD   On: 10/30/2019 19:36    EKG: Independently reviewed.  Sinus  tachycardia, no prior tracing for comparison.  Assessment/Plan Principal Problem:   Pneumonia due to COVID-19 virus Active Problems:   Sepsis ([place]   Acute respiratory failure with hypoxemia (HCC)   Elevated transaminase level   Essential hypertension   Sepsis and acute hypoxemic respiratory failure secondary to COVID-19 viral multifocal pneumonia: SARS-CoV-2 PCR test positive on 9/28.  Meets sepsis criteria - 2 SIRS (tachycardia, tachypnea) + source of infection. CT angiogram chest negative for PE and showing findings consistent with COVID-19 multifocal pneumonia.  Hypoxic to the 70s on room air.  Tachypneic with respiratory rate in the 30s.  Satting low-mid 80s on 15 L oxygen via high flow nasal cannula, oxygen saturation now improved to low  90s with heated high flow nasal cannula. Inflammatory markers elevated: D-dimer 3.92, LDH 930, ferritin 3423, fibrinogen 646, CRP 10.4.  Lactic acid normal.  Bacterial pneumonia less likely given no leukocytosis and procalcitonin <0.10.  Blood culture showing NGTD. -Remdesivir -IV Solu-Medrol 0.5 mg/kg every 12 hours -Discussed FDA's emergency use authorization of baricitinib for the treatment of Covid infection in hospitalized patients requiring supplemental oxygen.  Snow denies history of hepatitis, HIV, malignancy, or active TB.  Risks versus benefits discussed with the Snow and he wants to proceed with using baricitinib to treat his illness.  Start baricitinib 4 mg daily. -Vitamin C, zinc, vitamin D -Antitussives as needed -Tylenol as needed -Bronchodilator as needed -Daily CBC with differential, CMP, CRP, D-dimer -Airborne and contact precautions -Incentive spirometry, flutter valve -Encourage prone positioning -Continuous pulse ox -Supplemental oxygen as needed to keep oxygen saturation above 90%  Mild transaminase elevation: Likely due to COVID-19 viral infection.  AST 101, ALT 45.  Alk phos and T bili normal. -Continue to monitor  LFTs  Hypertension: Systolic currently in the 150s to 160s.  Snow is not on any antihypertensives at home. -IV labetalol PRN SBP >170  Tobacco use -NicoDerm patch and counseling  HIV screening The [place] falls between the ages of 13-64 and should be screened for HIV, therefore HIV testing ordered.  DVT prophylaxis: Lovenox Code Status: Full code Family Communication: No family available at this time. Disposition Plan: Status is: Inpatient  Remains inpatient appropriate because:IV treatments appropriate due to intensity of illness or inability to take PO, Inpatient level of care appropriate due to severity of illness and New supplemental oxygen requirement   Dispo: The Snow is from: Home              Anticipated d/c is to: Home              Anticipated d/c date is: > 3 days              Snow currently is not medically stable to d/c.  The medical decision making on this Snow was of high complexity and the Snow is at high risk for clinical deterioration, therefore this is a level 3 visit.  Shela Leff MD [facility]  If 7PM-7AM, please contact night-coverage [email or web address]  10/31/2019, 9:19 PM

## 2019-11-01 ENCOUNTER — Inpatient Hospital Stay (HOSPITAL_COMMUNITY): Payer: HRSA Program

## 2019-11-01 DIAGNOSIS — U071 COVID-19: Secondary | ICD-10-CM | POA: Diagnosis not present

## 2019-11-01 DIAGNOSIS — J1282 Pneumonia due to coronavirus disease 2019: Secondary | ICD-10-CM | POA: Diagnosis not present

## 2019-11-01 LAB — COMPREHENSIVE METABOLIC PANEL
ALT: 50 U/L — ABNORMAL HIGH (ref 0–44)
AST: 97 U/L — ABNORMAL HIGH (ref 15–41)
Albumin: 3 g/dL — ABNORMAL LOW (ref 3.5–5.0)
Alkaline Phosphatase: 120 U/L (ref 38–126)
Anion gap: 13 (ref 5–15)
BUN: 12 mg/dL (ref 6–20)
CO2: 28 mmol/L (ref 22–32)
Calcium: 8.9 mg/dL (ref 8.9–10.3)
Chloride: 99 mmol/L (ref 98–111)
Creatinine, Ser: 1.09 mg/dL (ref 0.61–1.24)
GFR calc Af Amer: >60 mL/min (ref >60)
GFR calc non Af Amer: >60 mL/min (ref >60)
Glucose, Bld: 172 mg/dL — ABNORMAL HIGH (ref 70–99)
Potassium: 5.1 mmol/L (ref 3.5–5.1)
Sodium: 140 mmol/L (ref 135–145)
Total Bilirubin: 1.1 mg/dL (ref 0.3–1.2)
Total Protein: 7.6 g/dL (ref 6.5–8.1)

## 2019-11-01 LAB — CBC WITH DIFFERENTIAL/PLATELET
Abs Immature Granulocytes: 0.03 10*3/uL (ref 0.00–0.07)
Basophils Absolute: 0 10*3/uL (ref 0.0–0.1)
Basophils Relative: 0 %
Eosinophils Absolute: 0 10*3/uL (ref 0.0–0.5)
Eosinophils Relative: 0 %
HCT: 46.6 % (ref 39.0–52.0)
Hemoglobin: 15.2 g/dL (ref 13.0–17.0)
Immature Granulocytes: 1 %
Lymphocytes Relative: 22 %
Lymphs Abs: 1 10*3/uL (ref 0.7–4.0)
MCH: 31.7 pg (ref 26.0–34.0)
MCHC: 32.6 g/dL (ref 30.0–36.0)
MCV: 97.1 fL (ref 80.0–100.0)
Monocytes Absolute: 0.3 10*3/uL (ref 0.1–1.0)
Monocytes Relative: 7 %
Neutro Abs: 3.2 10*3/uL (ref 1.7–7.7)
Neutrophils Relative %: 70 %
Platelets: 220 10*3/uL (ref 150–400)
RBC: 4.8 MIL/uL (ref 4.22–5.81)
RDW: 11.8 % (ref 11.5–15.5)
WBC: 4.5 10*3/uL (ref 4.0–10.5)
nRBC: 0 % (ref 0.0–0.2)

## 2019-11-01 LAB — HEMOGLOBIN A1C
Hgb A1c MFr Bld: 5.6 % (ref 4.8–5.6)
Mean Plasma Glucose: 114.02 mg/dL

## 2019-11-01 LAB — GLUCOSE, CAPILLARY
Glucose-Capillary: 154 mg/dL — ABNORMAL HIGH (ref 70–99)
Glucose-Capillary: 154 mg/dL — ABNORMAL HIGH (ref 70–99)
Glucose-Capillary: 128 mg/dL — ABNORMAL HIGH (ref 70–99)

## 2019-11-01 LAB — C-REACTIVE PROTEIN: CRP: 10.4 mg/dL — ABNORMAL HIGH (ref <1.0)

## 2019-11-01 LAB — MAGNESIUM: Magnesium: 2.3 mg/dL (ref 1.7–2.4)

## 2019-11-01 LAB — BRAIN NATRIURETIC PEPTIDE: B Natriuretic Peptide: 31.3 pg/mL (ref 0.0–100.0)

## 2019-11-01 LAB — PROCALCITONIN: Procalcitonin: 0.1 ng/mL

## 2019-11-01 LAB — HIV ANTIBODY (ROUTINE TESTING W REFLEX): HIV Screen 4th Generation wRfx: NONREACTIVE

## 2019-11-01 LAB — D-DIMER, QUANTITATIVE: D-Dimer, Quant: 6.53 ug/mL-FEU — ABNORMAL HIGH (ref 0.00–0.50)

## 2019-11-01 MED ORDER — ENOXAPARIN SODIUM 150 MG/ML ~~LOC~~ SOLN
130.0000 mg | Freq: Two times a day (BID) | SUBCUTANEOUS | Status: DC
  Administered 2019-11-01 – 2019-11-02 (×2): 130 mg via SUBCUTANEOUS
  Filled 2019-11-01 (×6): qty 0.86

## 2019-11-01 MED ORDER — METHYLPREDNISOLONE SODIUM SUCC 125 MG IJ SOLR
80.0000 mg | Freq: Two times a day (BID) | INTRAMUSCULAR | Status: DC
  Administered 2019-11-01 – 2019-11-02 (×4): 80 mg via INTRAVENOUS
  Filled 2019-11-01 (×4): qty 2

## 2019-11-01 MED ORDER — INSULIN ASPART 100 UNIT/ML ~~LOC~~ SOLN
0.0000 [IU] | Freq: Three times a day (TID) | SUBCUTANEOUS | Status: DC
  Administered 2019-11-01 – 2019-11-03 (×6): 3 [IU] via SUBCUTANEOUS
  Administered 2019-11-04: 2 [IU] via SUBCUTANEOUS
  Administered 2019-11-04: 3 [IU] via SUBCUTANEOUS
  Administered 2019-11-05 (×3): 2 [IU] via SUBCUTANEOUS
  Administered 2019-11-06: 5 [IU] via SUBCUTANEOUS
  Administered 2019-11-06: 3 [IU] via SUBCUTANEOUS
  Administered 2019-11-06: 2 [IU] via SUBCUTANEOUS
  Administered 2019-11-07: 3 [IU] via SUBCUTANEOUS
  Administered 2019-11-07 – 2019-11-08 (×3): 2 [IU] via SUBCUTANEOUS
  Administered 2019-11-08: 3 [IU] via SUBCUTANEOUS
  Administered 2019-11-09: 8 [IU] via SUBCUTANEOUS
  Administered 2019-11-10 – 2019-11-11 (×5): 3 [IU] via SUBCUTANEOUS
  Administered 2019-11-12: 5 [IU] via SUBCUTANEOUS
  Administered 2019-11-12 – 2019-11-13 (×3): 3 [IU] via SUBCUTANEOUS
  Administered 2019-11-13 – 2019-11-14 (×2): 2 [IU] via SUBCUTANEOUS
  Administered 2019-11-14: 3 [IU] via SUBCUTANEOUS

## 2019-11-01 MED ORDER — ENOXAPARIN SODIUM 150 MG/ML ~~LOC~~ SOLN
1.0000 mg/kg | Freq: Two times a day (BID) | SUBCUTANEOUS | Status: DC
  Filled 2019-11-01: qty 0.88

## 2019-11-01 MED ORDER — SODIUM POLYSTYRENE SULFONATE 15 GM/60ML PO SUSP
15.0000 g | Freq: Once | ORAL | Status: AC
  Administered 2019-11-01: 15 g via ORAL
  Filled 2019-11-01: qty 60

## 2019-11-01 MED ORDER — INSULIN ASPART 100 UNIT/ML ~~LOC~~ SOLN
0.0000 [IU] | Freq: Every day | SUBCUTANEOUS | Status: DC
  Administered 2019-11-12: 3 [IU] via SUBCUTANEOUS

## 2019-11-01 NOTE — TOC Initial Note (Signed)
Transition of Care [facility]) - Initial/Assessment Note    Patient Details  Name: Brandon Snow MRN: [id number] Date of Birth: 05/20/1960  [facility] CM/SW Contact:    Lockie Pares, RN Phone Number: 11/01/2019, 5:23 PM  Clinical Narrative:                 Admitted with COVID ARDS respiratory failure on HFNC  30LPM sats at 92%  PT recommending DME rolling walker, 3:1., and home health  Patient uninsured , may need assistance with medications and charity for DME. Still projecting EDD at 10/4. Cm will follow for needs.  Expected Discharge Plan: Home/Self Care Barriers to Discharge: Continued Medical Work up   Patient Goals and CMS Choice        Expected Discharge Plan and Services Expected Discharge Plan: Home/Self Care   Discharge Planning Services: CM Consult   Living arrangements for the past 2 months: Apartment                                      Prior Living Arrangements/Services Living arrangements for the past 2 months: Apartment   Patient language and need for interpreter reviewed:: Yes        Need for Family Participation in Patient Care: Yes (Comment) Care giver support system in place?: Yes (comment)   Criminal Activity/Legal Involvement Pertinent to Current Situation/Hospitalization: No - Comment as needed  Activities of Daily Living Home Assistive Devices/Equipment: None ADL Screening (condition at time of admission) Patient's cognitive ability adequate to safely complete daily activities?: Yes Is the patient deaf or have difficulty hearing?: No Does the patient have difficulty seeing, even when wearing glasses/contacts?: Yes Does the patient have difficulty concentrating, remembering, or making decisions?: No Patient able to express need for assistance with ADLs?: Yes Does the patient have difficulty dressing or bathing?: No Independently performs ADLs?: Yes (appropriate for developmental age) Does the patient have difficulty  walking or climbing stairs?: Yes Weakness of Legs: Both Weakness of Arms/Hands: None  Permission Sought/Granted                  Emotional Assessment       Orientation: : Oriented to Self, Oriented to Place, Oriented to  Time, Oriented to Situation Alcohol / Substance Use: Not Applicable Psych Involvement: No (comment)  Admission diagnosis:  Pneumonia due to COVID-19 virus [U07.1, J12.82] Patient Active Problem List   Diagnosis Date Noted  . Sepsis (HCC) 10/31/2019  . Acute respiratory failure with hypoxemia (HCC) 10/31/2019  . Elevated transaminase level 10/31/2019  . Essential hypertension 10/31/2019  . Pneumonia due to COVID-19 virus 10/30/2019  . Right knee pain 11/26/2010   PCP:  Patient, No Pcp Per Pharmacy:   [id number] - [place], [place] - [address] AT [facility] [address] [place] [address] Phone: [phone] Fax: [phone]     Social Determinants of Health (SDOH) Interventions    Readmission Risk Interventions No flowsheet data found.

## 2019-11-01 NOTE — Evaluation (Addendum)
Physical Therapy Evaluation Patient Details Name: Brandon Snow MRN: [id number] DOB: [birth year] Today's Date: 11/01/2019   History of Present Illness  59yo male c/o SOB and cough. Hypoxic to 77% in the ED on RA. PE negative. Covid positive. Unvaccinated.  PMH HTN  Clinical Impression  Patient received in recliner, very pleasant and cooperative with therapies. Demonstrates good ability for self-pacing of activities and also did a great job in reminding himself to breathe through his nose instead of his mouth. Very easily fatigued- really only able to tolerate about 10 seconds of standing marches and 20 seconds of heel raises with one sitting rest break in between due to fatigue. Had on and off desats to 87% on 30LPM per HFNC but able to recover well with PLB with minimal cuing. Practiced seated covid HEP and discussed frequency throughout the day. Left up in recliner with all needs met this morning. Likely appropriate for HHPT at DC- assuming that he progresses well medically with covid recovery. Will continue to monitor and update reccs if necessary.     Follow Up Recommendations Home health PT    Equipment Recommendations  Rolling walker with 5" wheels;3in1 (PT)    Recommendations for Other Services       Precautions / Restrictions Precautions Precautions: Other (comment) Precaution Comments: watch sats, Covid + Restrictions Weight Bearing Restrictions: No      Mobility  Bed Mobility               General bed mobility comments: OOB in recliner  Transfers Overall transfer level: Needs assistance Equipment used: None Transfers: Sit to/from Stand Sit to Stand: Min assist         General transfer comment: boosted to standing with Min guard, then got a bit unsteady and needed MinA to really stabiilze  Ambulation/Gait             General Gait Details: limited by length of O2 cords and fatigue- able to perform standing marches and heel raises with MinA for  balance  Stairs            Wheelchair Mobility    Modified Rankin (Stroke Patients Only)       Balance Overall balance assessment: Needs assistance Sitting-balance support: Feet supported Sitting balance-Leahy Scale: Good     Standing balance support: Bilateral upper extremity supported;During functional activity Standing balance-Leahy Scale: Poor Standing balance comment: needs MinA from external source to maintain balance with dynamic tasks                             Pertinent Vitals/Pain Pain Assessment: No/denies pain    Home Living                        Prior Function                 Hand Dominance        Extremity/Trunk Assessment   Upper Extremity Assessment Upper Extremity Assessment: Defer to OT evaluation    Lower Extremity Assessment Lower Extremity Assessment: Generalized weakness    Cervical / Trunk Assessment Cervical / Trunk Assessment: Normal  Communication      Cognition Arousal/Alertness: Awake/alert Behavior During Therapy: WFL for tasks assessed/performed Overall Cognitive Status: Within Functional Limits for tasks assessed  General Comments: very pleasant and cooperative, good insight into deficits and self-pacing of activities      General Comments General comments (skin integrity, edema, etc.): on 30LPM HFNC during session- occasional desaturations to 87% but quickly rebounded to 90s in sitting and standing with PLB, RR and HR WNL during session    Exercises     Assessment/Plan    PT Assessment Patient needs continued PT services  PT Problem List Decreased strength;Decreased knowledge of use of DME;Decreased activity tolerance;Decreased safety awareness;Decreased balance;Decreased mobility;Cardiopulmonary status limiting activity;Decreased coordination       PT Treatment Interventions DME instruction;Balance training;Gait training;Stair  training;Functional mobility training;Patient/family education;Therapeutic activities;Therapeutic exercise    PT Goals (Current goals can be found in the Care Plan section)  Acute Rehab PT Goals Patient Stated Goal: feel better, keep O2 where it needs to be PT Goal Formulation: With patient Time For Goal Achievement: 11/15/19 Potential to Achieve Goals: Fair    Frequency Min 3X/week   Barriers to discharge        Co-evaluation               AM-PAC PT "6 Clicks" Mobility  Outcome Measure Help needed turning from your back to your side while in a flat bed without using bedrails?: A Little Help needed moving from lying on your back to sitting on the side of a flat bed without using bedrails?: A Little Help needed moving to and from a bed to a chair (including a wheelchair)?: A Little Help needed standing up from a chair using your arms (e.g., wheelchair or bedside chair)?: A Little Help needed to walk in hospital room?: A Little Help needed climbing 3-5 steps with a railing? : A Lot 6 Click Score: 17    End of Session Equipment Utilized During Treatment: Oxygen Activity Tolerance: Patient tolerated treatment well Patient left: in chair;with call bell/phone within reach Nurse Communication: Mobility status PT Visit Diagnosis: Unsteadiness on feet (R26.81);Difficulty in walking, not elsewhere classified (R26.2);Muscle weakness (generalized) (M62.81)    Time: 9292-4462 PT Time Calculation (min) (ACUTE ONLY): 25 min   Charges:   PT Evaluation $PT Eval Moderate Complexity: 1 Mod PT Treatments $Therapeutic Activity: 8-22 mins        Windell Norfolk, DPT, PN1   Supplemental Physical Therapist [place]    Pager [phone] Acute Rehab Office [phone]

## 2019-11-01 NOTE — Progress Notes (Signed)
PROGRESS NOTE                                                                                                                                                                                                             Patient Demographics:    Brandon Snow, is a 59 y.o. male, DOB - 04-16-1960, ZOX:[id number]  Outpatient Primary MD for the patient is Patient, No Pcp Per    LOS - 1  Admit date - 10/30/2019    Chief Complaint  Patient presents with  . Cough  . Shortness of Breath       Brief Narrative -  Brandon Snow is a 59 y.o. male with medical history significant of hypertension presented to the ED on 9/29 via EMS for evaluation of shortness of breath and cough.  Oxygen saturation 77% on room air, there he was diagnosed with acute hypoxic respiratory failure due to COVID-19 pneumonia and ARDS.  He was admitted to the hospital and placed on heated high flow.   Subjective:    Brandon Snow today has, No headache, No chest pain, No abdominal pain - No Nausea, No new weakness tingling or numbness, ++ SOB   Assessment  & Plan :     1. Acute Hypoxic Resp. Failure due to Acute Covid 19 Viral Pneumonitis during the ongoing 2020 Covid 19 Pandemic - he is unfortunately unvaccinated and has incurred severe parenchymal lung injury with ARDS due to COVID-19 pneumonia, has been started on high-dose IV steroids, Remdesivir and Baricitinib.  Currently on heated high flow oxygen.  Will monitor closely.  Extremely tenuous.  Sepsis pathophysiology present on admission has resolved.  Encouraged the patient to sit up in chair in the daytime use I-S and flutter valve for pulmonary toiletry and then prone in bed when at night.  Will advance activity and titrate down oxygen as possible.    SpO2: 94 % O2 Flow Rate (L/min): 30 L/min FiO2 (%): 100 %  Recent Labs  Lab 10/29/19 1446 10/30/19 1918 11/01/19 0611  WBC  --  6.1 4.5  PLT   --  159 220  CRP  --  10.4* 10.4*  DDIMER  --  3.92* 6.53*  PROCALCITON  --  <0.10  --   AST  --  101* 97*  ALT  --  45* 50*  ALKPHOS  --  95 120  BILITOT  --  1.1 1.1  ALBUMIN  --  3.3* 3.0*  LATICACIDVEN  --  1.3  --   SARSCOV2NAA POSITIVE*  --   --     2.  Asymptomatic transaminitis due to viral infection.  Monitor.  3.  Smoking.  Counseled to quit.  4.  Stress-induced hypertension.  On as needed labetalol.  Monitor.  5.  Evaded D-dimer due to inflammation.  CTA negative, check leg ultrasound.  Full dose Lovenox till D-dimer trends below two.     Condition - Extremely Guarded  Family Communication  : Friend Ms. Brendia Sacksequila - [phone]- [phone] on 11/01/19  Code Status :  Full  Consults  :  None  Procedures  :    CTA - no PE  Leg US  PUD Prophylaxis :   Disposition Plan  :    Status is: Inpatient  Remains inpatient appropriate because:IV treatments appropriate due to intensity of illness or inability to take PO   Dispo: The patient is from: Home              Anticipated d/c is to: Home              Anticipated d/c date is: > 3 days              Patient currently is not medically stable to d/c.      DVT Prophylaxis  :  Lovenox   Lab Results  Component Value Date   PLT 220 11/01/2019    Diet :  Diet Order            Diet Heart Room service appropriate? Yes; Fluid consistency: Thin  Diet effective now                  Inpatient Medications  Scheduled Meds: . vitamin C  500 mg Oral Daily  . baricitinib  4 mg Oral Daily  . cholecalciferol  1,000 Units Oral Daily  . enoxaparin (LOVENOX) injection  65 mg Subcutaneous QHS  . influenza vac split quadrivalent PF  0.5 mL Intramuscular Tomorrow-1000  . insulin aspart  0-15 Units Subcutaneous TID WC  . insulin aspart  0-5 Units Subcutaneous QHS  . methylPREDNISolone (SOLU-MEDROL) injection  80 mg Intravenous Q12H  . nicotine  14 mg Transdermal Daily  . pneumococcal 23 valent vaccine  0.5 mL Intramuscular  Tomorrow-1000  . zinc sulfate  220 mg Oral Daily   Continuous Infusions: . sodium chloride Stopped (10/31/19 1114)  . remdesivir 100 mg in NS 100 mL Stopped (10/31/19 1114)   PRN Meds:.sodium chloride, acetaminophen, albuterol, chlorpheniramine-HYDROcodone, guaiFENesin-dextromethorphan, labetalol, lip balm  Antibiotics  :    Anti-infectives (From admission, onward)   Start     Dose/Rate Route Frequency Ordered Stop   10/31/19 1000  remdesivir 100 mg in sodium chloride 0.9 % 100 mL IVPB        100 mg 200 mL/hr over 30 Minutes Intravenous Daily 10/30/19 1851 11/04/19 0959   10/30/19 1930  remdesivir 100 mg in sodium chloride 0.9 % 100 mL IVPB       "Followed by" Linked Group Details   100 mg 200 mL/hr over 30 Minutes Intravenous  Once 10/30/19 1851 10/30/19 2106   10/30/19 1900  remdesivir 100 mg in sodium chloride 0.9 % 100 mL IVPB       "Followed by" Linked Group Details   100 mg 200 mL/hr over 30 Minutes Intravenous  Once 10/30/19 1851 10/30/19 2106  Time Spent in minutes  30   Susa Raring M.D on 11/01/2019 at 10:02 AM  To page go to [email or web address] - password [facility]  [facility] -  Office  [phone]     See all Orders from today for further details    Objective:   Vitals:   11/01/19 0007 11/01/19 0150 11/01/19 0300 11/01/19 0428  BP: (!) 144/83   129/68  Pulse: 100 (!) 104 99 93  Resp: (!) 23 (!) 23  (!) 23  Temp: 98.9 F (37.2 C)   98.7 F (37.1 C)  TempSrc: Oral   Oral  SpO2: 92% 91%  94%  Weight:      Height:        Wt Readings from Last 3 Encounters:  10/31/19 130.5 kg  12/22/18 124.7 kg  10/15/16 124.7 kg     Intake/Output Summary (Last 24 hours) at 11/01/2019 1002 Last data filed at 11/01/2019 0600 Gross per 24 hour  Intake 343.35 ml  Output 1880 ml  Net -1536.65 ml     Physical Exam  Awake Alert, No new F.N deficits, Normal affect [facility].AT,PERRAL Supple Neck,No JVD, No cervical lymphadenopathy appriciated.  Symmetrical  Chest wall movement, Good air movement bilaterally, CTAB RRR,No Gallops,Rubs or new Murmurs, No Parasternal Heave +ve B.Sounds, Abd Soft, No tenderness, No organomegaly appriciated, No rebound - guarding or rigidity. No Cyanosis, Clubbing or edema, No new Rash or bruise       Data Review:    CBC Recent Labs  Lab 10/30/19 1918 11/01/19 0611  WBC 6.1 4.5  HGB 14.3 15.2  HCT 43.1 46.6  PLT 159 220  MCV 95.8 97.1  MCH 31.8 31.7  MCHC 33.2 32.6  RDW 11.8 11.8  LYMPHSABS 1.0 1.0  MONOABS 0.3 0.3  EOSABS 0.0 0.0  BASOSABS 0.0 0.0    Recent Labs  Lab 10/30/19 1918 11/01/19 0611  NA 134* 140  K 4.0 5.1  CL 98 99  CO2 25 28  GLUCOSE 114* 172*  BUN 9 12  CREATININE 0.98 1.09  CALCIUM 8.2* 8.9  AST 101* 97*  ALT 45* 50*  ALKPHOS 95 120  BILITOT 1.1 1.1  ALBUMIN 3.3* 3.0*  CRP 10.4* 10.4*  DDIMER 3.92* 6.53*  PROCALCITON <0.10  --   LATICACIDVEN 1.3  --     ------------------------------------------------------------------------------------------------------------------ Recent Labs    10/30/19 1918  TRIG 120    No results found for: HGBA1C ------------------------------------------------------------------------------------------------------------------ No results for input(s): TSH, T4TOTAL, T3FREE, THYROIDAB in the last 72 hours.  Invalid input(s): FREET3  Cardiac Enzymes No results for input(s): CKMB, TROPONINI, MYOGLOBIN in the last 168 hours.  Invalid input(s): CK ------------------------------------------------------------------------------------------------------------------ No results found for: BNP  Micro Results Recent Results (from the past 240 hour(s))  SARS CORONAVIRUS 2 (TAT 6-24 HRS) Nasopharyngeal Nasopharyngeal Swab     Status: Abnormal   Collection Time: 10/29/19  2:46 PM   Specimen: Nasopharyngeal Swab  Result Value Ref Range Status   SARS Coronavirus 2 POSITIVE (A) NEGATIVE Final    Comment: RESULT CALLED TO, READ BACK BY AND VERIFIED  WITH: EMAILED MELISSA BROWNING 0127 10/30/2019 T. TYSOR (NOTE) SARS-CoV-2 target nucleic acids are DETECTED.  The SARS-CoV-2 RNA is generally detectable in upper and lower respiratory specimens during the acute phase of infection. Positive results are indicative of the presence of SARS-CoV-2 RNA. Clinical correlation with patient history and other diagnostic information is  necessary to determine patient infection status. Positive results do not rule out bacterial infection or co-infection with other viruses.  The expected result is Negative.  Fact Sheet for Patients: HairSlick.no  Fact Sheet for Healthcare Providers: quierodirigir.com  This test is not yet approved or cleared by the [place] FDA and  has been authorized for detection and/or diagnosis of SARS-CoV-2 by FDA under an Emergency Use Authorization (EUA). This EUA will remain  in effect (meaning this tes t can be used) for the duration of the COVID-19 declaration under Section 564(b)(1) of the Act, 21 U.S.C. section 360bbb-3(b)(1), unless the authorization is terminated or revoked sooner.   Performed at [facility], [address] [place], [place] [address]   Blood Culture (routine x 2)     Status: None (Preliminary result)   Collection Time: 10/30/19  7:00 PM   Specimen: BLOOD RIGHT HAND  Result Value Ref Range Status   Specimen Description   Final    BLOOD RIGHT HAND Performed at [facility], [address] [place], [place] [address]    Special Requests   Final    BOTTLES DRAWN AEROBIC AND ANAEROBIC Blood Culture adequate volume Performed at [facility], [address] [place], [place] [address]    Culture   Final    NO GROWTH 2 DAYS Performed at [facility], [address], [place], [place] [address]    Report Status PENDING  Incomplete  Blood Culture (routine x 2)     Status: None (Preliminary result)    Collection Time: 10/30/19  7:20 PM   Specimen: BLOOD  Result Value Ref Range Status   Specimen Description   Final    BLOOD RIGHT ANTECUBITAL Performed at [facility], [address] [place], [place] [address]    Special Requests   Final    BOTTLES DRAWN AEROBIC AND ANAEROBIC Blood Culture results may not be optimal due to an inadequate volume of blood received in culture bottles Performed at [facility], [address] [place], [place] [address]    Culture   Final    NO GROWTH 2 DAYS Performed at [facility], [address], [place], [place] [address]    Report Status PENDING  Incomplete    Radiology Reports CT Angio Chest PE W and/or Wo Contrast  Result Date: 10/30/2019 CLINICAL DATA:  COVID-19 positive, worsening shortness of breath at home, O2 sat 77% on room air EXAM: CT ANGIOGRAPHY CHEST WITH CONTRAST TECHNIQUE: Multidetector CT imaging of the chest was performed using the standard protocol during bolus administration of intravenous contrast. Multiplanar CT image reconstructions and MIPs were obtained to evaluate the vascular anatomy. CONTRAST:  OMNIPAQUE IOHEXOL 350 MG/ML SOLN COMPARISON:  Radiograph 10/30/2019 FINDINGS: Cardiovascular: Satisfactory opacification the pulmonary arteries to the segmental level. No pulmonary artery filling defects are identified. Central pulmonary arteries are normal caliber. The aortic root is suboptimally assessed given cardiac pulsation artifact. The aorta is normal caliber. No acute luminal abnormality of the imaged aorta. No periaortic stranding or hemorrhage. Normal 3 vessel branching of the aortic arch. Proximal great vessels are unremarkable. Normal heart size. No pericardial effusion. No major venous abnormalities. Mediastinum/Nodes: No mediastinal fluid or gas. Normal thyroid gland and thoracic inlet. No acute abnormality of the trachea or esophagus. Scattered borderline enlarged nodes with preserved nodal  architecture within the mediastinum and hila for instance a 12 mm left paratracheal node (4/37), 10 mm right paratracheal node (4/37), and an 11 mm right hilar node (4/51) are favored to be reactive. No  worrisome enlarged mediastinal, hilar or axillary adenopathy. Lungs/Pleura: Multifocal areas of mixed consolidative and ground-glass opacity with interstitial/septal thickening in a crazy paving pattern which is an often reported imaging feature COVID 19 among other infectious and inflammatory etiologies. No pneumothorax or effusion. Mild diffuse airways thickening. Upper Abdomen: No acute abnormalities present in the visualized portions of the upper abdomen. Musculoskeletal: No acute osseous abnormality or suspicious osseous lesion. Multilevel degenerative changes are present in the imaged portions of the spine. No worrisome chest wall lesions. Review of the MIP images confirms the above findings. IMPRESSION: 1. No evidence of pulmonary embolism. 2. Multifocal areas of mixed consolidative and ground-glass opacity with interstitial/septal thickening compatible with reported imaging features of COVID 19. 3. Scattered borderline enlarged nodes within the mediastinum and hila, favored to be reactive. Electronically Signed   By: Kreg Shropshire M.D.   On: 10/30/2019 20:59   DG Chest Port 1 View  Result Date: 11/01/2019 CLINICAL DATA:  COVID-19 positive with airspace opacity EXAM: PORTABLE CHEST 1 VIEW COMPARISON:  Chest radiograph and chest CT October 30, 2019 FINDINGS: Airspace opacity is again noted throughout much of the right lung with greatest concentration of opacity in the right mid and lower lung regions. Subtle opacity noted in the left mid and lower lung regions. Stable cardiac enlargement. Pulmonary vascularity normal. No adenopathy. No bone lesions. IMPRESSION: Stable multifocal airspace opacity, consistent with known atypical organism pneumonia. No new areas of opacity evident. Stable cardiac prominence.  No adenopathy appreciable. Electronically Signed   By: Bretta Bang III M.D.   On: 11/01/2019 08:26   DG Chest Port 1 View  Result Date: 10/30/2019 CLINICAL DATA:  Shortness of breath.  COVID. EXAM: PORTABLE CHEST 1 VIEW COMPARISON:  10/15/2016 FINDINGS: Multifocal airspace opacities throughout the right greater than left lungs. No visible pleural effusions. No pneumothorax. Cardiac silhouette is accentuated by low lung volumes and portable technique. No acute osseous abnormality. IMPRESSION: Multifocal right greater than left airspace opacities, compatible with multifocal pneumonia and reported history of COVID. Electronically Signed   By: Feliberto Harts MD   On: 10/30/2019 19:36

## 2019-11-01 NOTE — Progress Notes (Signed)
ANTICOAGULATION CONSULT NOTE - Initial Consult  Pharmacy Consult for Full dose Lovenox  Indication: empiric VTE treatment due to  Rising d-dimer, highly suspicious for DVT or PE  No Known Allergies  Patient Measurements: Height: 5\' 9"  (175.3 cm) Weight: 130.5 kg (287 lb 12.8 oz) IBW/kg (Calculated) : 70.7   Vital Signs: Temp: 98.7 F (37.1 C) (10/01 0428) Temp Source: Oral (10/01 0428) BP: 129/68 (10/01 0428) Pulse Rate: 93 (10/01 0428)  Labs: Recent Labs    10/30/19 1918 11/01/19 0611  HGB 14.3 15.2  HCT 43.1 46.6  PLT 159 220  CREATININE 0.98 1.09    Estimated Creatinine Clearance: 101.3 mL/min (by C-G formula based on SCr of 1.09 mg/dL).   Medical History: Past Medical History:  Diagnosis Date   Hypertension     Medications:  Medications Prior to Admission  Medication Sig Dispense Refill Last Dose   Ascorbic Acid (VITAMIN C) 1000 MG tablet Take 1,000 mg by mouth daily.   10/29/2019   Multiple Vitamin (MULTIVITAMIN) tablet Take 1 tablet by mouth daily.   10/29/2019   naproxen sodium (ALEVE) 220 MG tablet Take 660 mg by mouth.   10/30/2019 at Unknown time   tetrahydrozoline-zinc (VISINE-AC) 0.05-0.25 % ophthalmic solution Place 1 drop into both eyes 3 (three) times daily as needed (dry eyes).   10/28/2019   azithromycin (ZITHROMAX Z-PAK) 250 MG tablet 2 po day one, then 1 daily x 4 days 5 tablet 0    benzonatate (TESSALON) 100 MG capsule Take 2 capsules (200 mg total) by mouth 2 (two) times daily as needed for cough. 20 capsule 0    ibuprofen (ADVIL,MOTRIN) 800 MG tablet Take 1 tablet (800 mg total) by mouth 3 (three) times daily. (Patient not taking: Reported on 10/31/2019) 21 tablet 0 Not Taking at Unknown time   predniSONE (DELTASONE) 20 MG tablet Take 60 mg daily x 2 days then 40 mg daily x 2 days then 20 mg daily x 2 days 12 tablet 0     Assessment: 59 y.o male, morbidly obese, BMI 42.5  Pharmacy consulted to increase Lovenox to full dose until d-dimer  drops below 2, due to highly suspicious for DVT or PE.  D-dimer 3.92> increased to 6.92.  LDH 930, ferritin 3423,fibrinogen 646, CRP 10.4>10.4 CBC wnl stable.  9/29 CT neg for PE   Goal of Therapy:  Anti-Xa level 0.6-1 units/ml 4hrs after LMWH dose given Monitor platelets by anticoagulation protocol: Yes   Plan:  Increase Lovenox to 1mg /kg SQ every 12  Hours = 130 mg SQ q12hr until d-dimer drops below 2, per Dr. 10/29 orders Daily d-dimer CBC q72 hours Monitor for bleeding Follow up BLE venous doppler study results.   , RPh Clinical Pharmacist [phone] Please check AMION for all [facility] phone numbers After 10:00 PM, call Main Pharmacy [phone] 11/01/2019,10:39 AM

## 2019-11-02 ENCOUNTER — Encounter (HOSPITAL_COMMUNITY): Payer: Self-pay

## 2019-11-02 DIAGNOSIS — J1282 Pneumonia due to coronavirus disease 2019: Secondary | ICD-10-CM | POA: Diagnosis not present

## 2019-11-02 DIAGNOSIS — U071 COVID-19: Secondary | ICD-10-CM | POA: Diagnosis not present

## 2019-11-02 LAB — COMPREHENSIVE METABOLIC PANEL
ALT: 52 U/L — ABNORMAL HIGH (ref 0–44)
AST: 79 U/L — ABNORMAL HIGH (ref 15–41)
Albumin: 3 g/dL — ABNORMAL LOW (ref 3.5–5.0)
Alkaline Phosphatase: 120 U/L (ref 38–126)
Anion gap: 13 (ref 5–15)
BUN: 13 mg/dL (ref 6–20)
CO2: 26 mmol/L (ref 22–32)
Calcium: 8.8 mg/dL — ABNORMAL LOW (ref 8.9–10.3)
Chloride: 97 mmol/L — ABNORMAL LOW (ref 98–111)
Creatinine, Ser: 1.02 mg/dL (ref 0.61–1.24)
GFR calc Af Amer: >60 mL/min (ref >60)
GFR calc non Af Amer: >60 mL/min (ref >60)
Glucose, Bld: 157 mg/dL — ABNORMAL HIGH (ref 70–99)
Potassium: 4.3 mmol/L (ref 3.5–5.1)
Sodium: 136 mmol/L (ref 135–145)
Total Bilirubin: 1.4 mg/dL — ABNORMAL HIGH (ref 0.3–1.2)
Total Protein: 7.3 g/dL (ref 6.5–8.1)

## 2019-11-02 LAB — CBC WITH DIFFERENTIAL/PLATELET
Abs Immature Granulocytes: 0 10*3/uL (ref 0.00–0.07)
Basophils Absolute: 0 10*3/uL (ref 0.0–0.1)
Basophils Relative: 0 %
Eosinophils Absolute: 0 10*3/uL (ref 0.0–0.5)
Eosinophils Relative: 0 %
HCT: 45.1 % (ref 39.0–52.0)
Hemoglobin: 15.1 g/dL (ref 13.0–17.0)
Lymphocytes Relative: 13 %
Lymphs Abs: 1.3 10*3/uL (ref 0.7–4.0)
MCH: 32.4 pg (ref 26.0–34.0)
MCHC: 33.5 g/dL (ref 30.0–36.0)
MCV: 96.8 fL (ref 80.0–100.0)
Monocytes Absolute: 0.7 10*3/uL (ref 0.1–1.0)
Monocytes Relative: 7 %
Neutro Abs: 8.1 10*3/uL — ABNORMAL HIGH (ref 1.7–7.7)
Neutrophils Relative %: 80 %
Platelets: 235 10*3/uL (ref 150–400)
RBC: 4.66 MIL/uL (ref 4.22–5.81)
RDW: 11.7 % (ref 11.5–15.5)
WBC: 10.1 10*3/uL (ref 4.0–10.5)
nRBC: 0 % (ref 0.0–0.2)
nRBC: 2 /100 WBC — ABNORMAL HIGH

## 2019-11-02 LAB — GLUCOSE, CAPILLARY
Glucose-Capillary: 139 mg/dL — ABNORMAL HIGH (ref 70–99)
Glucose-Capillary: 146 mg/dL — ABNORMAL HIGH (ref 70–99)
Glucose-Capillary: 167 mg/dL — ABNORMAL HIGH (ref 70–99)
Glucose-Capillary: 112 mg/dL — ABNORMAL HIGH (ref 70–99)

## 2019-11-02 LAB — BRAIN NATRIURETIC PEPTIDE: B Natriuretic Peptide: 46.7 pg/mL (ref 0.0–100.0)

## 2019-11-02 LAB — D-DIMER, QUANTITATIVE: D-Dimer, Quant: >20 ug/mL-FEU — ABNORMAL HIGH (ref 0.00–0.50)

## 2019-11-02 LAB — PROCALCITONIN: Procalcitonin: <0.1 ng/mL

## 2019-11-02 LAB — C-REACTIVE PROTEIN: CRP: 7.2 mg/dL — ABNORMAL HIGH (ref <1.0)

## 2019-11-02 LAB — MAGNESIUM: Magnesium: 2.1 mg/dL (ref 1.7–2.4)

## 2019-11-02 MED ORDER — ENOXAPARIN SODIUM 150 MG/ML ~~LOC~~ SOLN
130.0000 mg | Freq: Two times a day (BID) | SUBCUTANEOUS | Status: DC
  Administered 2019-11-02 – 2019-11-03 (×2): 130 mg via SUBCUTANEOUS
  Filled 2019-11-02 (×3): qty 0.86

## 2019-11-02 NOTE — Progress Notes (Signed)
PROGRESS NOTE                                                                                                                                                                                                             Patient Demographics:    Brandon Snow, is a 59 y.o. male, DOB - 10/30/1960, PZW:[id number]  Outpatient Primary MD for the patient is Patient, No Pcp Per    LOS - 2  Admit date - 10/30/2019    Chief Complaint  Patient presents with  . Cough  . Shortness of Breath       Brief Narrative -  Brandon Snow is a 59 y.o. male with medical history significant of hypertension presented to the ED on 9/29 via EMS for evaluation of shortness of breath and cough.  Oxygen saturation 77% on room air, there he was diagnosed with acute hypoxic respiratory failure due to COVID-19 pneumonia and ARDS.  He was admitted to the hospital and placed on heated high flow.   Subjective:   Patient in bed, appears comfortable, denies any headache, no fever, no chest pain or pressure, improved shortness of breath , no abdominal pain. No focal weakness.    Assessment  & Plan :     1. Acute Hypoxic Resp. Failure due to Acute Covid 19 Viral Pneumonitis during the ongoing 2020 Covid 19 Pandemic - he is unfortunately unvaccinated and has incurred severe parenchymal lung injury with ARDS due to COVID-19 pneumonia, has been started on high-dose IV steroids, Remdesivir and Baricitinib.  Currently on heated high flow oxygen.  Will monitor closely.  Extremely tenuous.  Sepsis pathophysiology present on admission has resolved.  Encouraged the patient to sit up in chair in the daytime use I-S and flutter valve for pulmonary toiletry and then prone in bed when at night.  Will advance activity and titrate down oxygen as possible.    SpO2: 92 % O2 Flow Rate (L/min): 15 L/min FiO2 (%): 90 %  Recent Labs  Lab 10/29/19 1446 10/30/19 1918  11/01/19 0611 11/01/19 0934 11/02/19 0500  WBC  --  6.1 4.5  --  10.1  PLT  --  159 220  --  235  CRP  --  10.4* 10.4*  --  7.2*  BNP  --   --   --  31.3 46.7  DDIMER  --  3.92* 6.53*  --  >20.00*  PROCALCITON  --  <0.10  --  0.10 <0.10  AST  --  101* 97*  --  79*  ALT  --  45* 50*  --  52*  ALKPHOS  --  95 120  --  120  BILITOT  --  1.1 1.1  --  1.4*  ALBUMIN  --  3.3* 3.0*  --  3.0*  LATICACIDVEN  --  1.3  --   --   --   SARSCOV2NAA POSITIVE*  --   --   --   --     2.  Asymptomatic transaminitis due to viral infection.  Monitor.  3.  Smoking.  Counseled to quit.  4.  Stress-induced hypertension.  On as needed labetalol.  Monitor.  5.  Risisng D-dimer due to inflammation.  CTA negative, check leg ultrasound. Continue Full dose Lovenox till D-dimer trends below 2.     Condition - Extremely Guarded  Family Communication  : [name] - [phone] on 11/01/19  Code Status :  Full  Consults  :  None  Procedures  :    CTA - no PE  Leg [place]  PUD Prophylaxis :   Disposition Plan  :    Status is: Inpatient  Remains inpatient appropriate because:IV treatments appropriate due to intensity of illness or inability to take PO   Dispo: The patient is from: Home              Anticipated d/c is to: Home              Anticipated d/c date is: > 3 days              Patient currently is not medically stable to d/c.      DVT Prophylaxis  :  Lovenox   Lab Results  Component Value Date   PLT 235 11/02/2019    Diet :  Diet Order            Diet Heart Room service appropriate? Yes; Fluid consistency: Thin  Diet effective now                  Inpatient Medications  Scheduled Meds: . vitamin C  500 mg Oral Daily  . baricitinib  4 mg Oral Daily  . cholecalciferol  1,000 Units Oral Daily  . enoxaparin (LOVENOX) injection  130 mg Subcutaneous Q12H  . influenza vac split quadrivalent PF  0.5 mL Intramuscular Tomorrow-1000  . insulin aspart  0-15 Units  Subcutaneous TID WC  . insulin aspart  0-5 Units Subcutaneous QHS  . methylPREDNISolone (SOLU-MEDROL) injection  80 mg Intravenous Q12H  . nicotine  14 mg Transdermal Daily  . pneumococcal 23 valent vaccine  0.5 mL Intramuscular Tomorrow-1000  . zinc sulfate  220 mg Oral Daily   Continuous Infusions: . sodium chloride Stopped (10/31/19 1114)  . remdesivir 100 mg in NS 100 mL 100 mg (11/02/19 0913)   PRN Meds:.sodium chloride, acetaminophen, albuterol, chlorpheniramine-HYDROcodone, guaiFENesin-dextromethorphan, labetalol, lip balm  Antibiotics  :    Anti-infectives (From admission, onward)   Start     Dose/Rate Route Frequency Ordered Stop   10/31/19 1000  remdesivir 100 mg in sodium chloride 0.9 % 100 mL IVPB        100 mg 200 mL/hr over 30 Minutes Intravenous Daily 10/30/19 1851 11/04/19 0959   10/30/19 1930  remdesivir 100 mg in sodium chloride 0.9 % 100 mL IVPB       "  Followed by" Linked Group Details   100 mg 200 mL/hr over 30 Minutes Intravenous  Once 10/30/19 1851 10/30/19 2106   10/30/19 1900  remdesivir 100 mg in sodium chloride 0.9 % 100 mL IVPB       "Followed by" Linked Group Details   100 mg 200 mL/hr over 30 Minutes Intravenous  Once 10/30/19 1851 10/30/19 2106       Time Spent in minutes  30   Susa RaringPrashant Ivelise Castillo M.D on 11/02/2019 at 11:13 AM  To page go to [email or web address] - password [email or web address]  [facility] -  Office  [phone]     See all Orders from today for further details    Objective:   Vitals:   11/02/19 0206 11/02/19 0515 11/02/19 0740 11/02/19 0916  BP:  (!) 148/100 121/82   Pulse: 95 89 99 99  Resp: (!) 22 (!) 23 19 (!) 21  Temp:  97.8 F (36.6 C) 97.7 F (36.5 C)   TempSrc:  Oral Oral   SpO2: 97% 97% 94% 92%  Weight:      Height:        Wt Readings from Last 3 Encounters:  10/31/19 130.5 kg  12/22/18 124.7 kg  10/15/16 124.7 kg     Intake/Output Summary (Last 24 hours) at 11/02/2019 1113 Last data filed at 11/02/2019 1000 Gross  per 24 hour  Intake 240 ml  Output 1200 ml  Net -960 ml     Physical Exam  Awake Alert, No new F.N deficits, Normal affect [place].AT,PERRAL Supple Neck,No JVD, No cervical lymphadenopathy appriciated.  Symmetrical Chest wall movement, Good air movement bilaterally, CTAB RRR,No Gallops, Rubs or new Murmurs, No Parasternal Heave +ve B.Sounds, Abd Soft, No tenderness, No organomegaly appriciated, No rebound - guarding or rigidity. No Cyanosis, Clubbing or edema, No new Rash or bruise      Data Review:    CBC Recent Labs  Lab 10/30/19 1918 11/01/19 0611 11/02/19 0500  WBC 6.1 4.5 10.1  HGB 14.3 15.2 15.1  HCT 43.1 46.6 45.1  PLT 159 220 235  MCV 95.8 97.1 96.8  MCH 31.8 31.7 32.4  MCHC 33.2 32.6 33.5  RDW 11.8 11.8 11.7  LYMPHSABS 1.0 1.0 1.3  MONOABS 0.3 0.3 0.7  EOSABS 0.0 0.0 0.0  BASOSABS 0.0 0.0 0.0    Recent Labs  Lab 10/30/19 1918 11/01/19 0611 11/01/19 0934 11/02/19 0500  NA 134* 140  --  136  K 4.0 5.1  --  4.3  CL 98 99  --  97*  CO2 25 28  --  26  GLUCOSE 114* 172*  --  157*  BUN 9 12  --  13  CREATININE 0.98 1.09  --  1.02  CALCIUM 8.2* 8.9  --  8.8*  AST 101* 97*  --  79*  ALT 45* 50*  --  52*  ALKPHOS 95 120  --  120  BILITOT 1.1 1.1  --  1.4*  ALBUMIN 3.3* 3.0*  --  3.0*  MG  --   --  2.3 2.1  CRP 10.4* 10.4*  --  7.2*  DDIMER 3.92* 6.53*  --  >20.00*  PROCALCITON <0.10  --  0.10 <0.10  LATICACIDVEN 1.3  --   --   --   HGBA1C  --   --  5.6  --   BNP  --   --  31.3 46.7    ------------------------------------------------------------------------------------------------------------------ Recent Labs    10/30/19 1918  TRIG 120    Lab Results  Component Value Date   HGBA1C 5.6 11/01/2019   ------------------------------------------------------------------------------------------------------------------ No results for input(s): TSH, T4TOTAL, T3FREE, THYROIDAB in the last 72 hours.  Invalid input(s): FREET3  Cardiac Enzymes No  results for input(s): CKMB, TROPONINI, MYOGLOBIN in the last 168 hours.  Invalid input(s): CK ------------------------------------------------------------------------------------------------------------------    Component Value Date/Time   BNP 46.7 11/02/2019 0500    Micro Results Recent Results (from the past 240 hour(s))  SARS CORONAVIRUS 2 (TAT 6-24 HRS) Nasopharyngeal Nasopharyngeal Swab     Status: Abnormal   Collection Time: 10/29/19  2:46 PM   Specimen: Nasopharyngeal Swab  Result Value Ref Range Status   SARS Coronavirus 2 POSITIVE (A) NEGATIVE Final    Comment: RESULT CALLED TO, READ BACK BY AND VERIFIED WITH: EMAILED MELISSA BROWNING 0127 10/30/2019 T. TYSOR (NOTE) SARS-CoV-2 target nucleic acids are DETECTED.  The SARS-CoV-2 RNA is generally detectable in upper and lower respiratory specimens during the acute phase of infection. Positive results are indicative of the presence of SARS-CoV-2 RNA. Clinical correlation with patient history and other diagnostic information is  necessary to determine patient infection status. Positive results do not rule out bacterial infection or co-infection with other viruses.  The expected result is Negative.  Fact Sheet for Patients: HairSlick.no  Fact Sheet for Healthcare Providers: quierodirigir.com  This test is not yet approved or cleared by the [place] FDA and  has been authorized for detection and/or diagnosis of SARS-CoV-2 by FDA under an Emergency Use Authorization (EUA). This EUA will remain  in effect (meaning this tes t can be used) for the duration of the COVID-19 declaration under Section 564(b)(1) of the Act, 21 U.S.C. section 360bbb-3(b)(1), unless the authorization is terminated or revoked sooner.   Performed at [facility], [address], [place], [place] [address]   Blood Culture (routine x 2)     Status: None (Preliminary result)    Collection Time: 10/30/19  7:00 PM   Specimen: BLOOD RIGHT HAND  Result Value Ref Range Status   Specimen Description   Final    BLOOD RIGHT HAND Performed at [facility], [address] [place], [place] [address]    Special Requests   Final    BOTTLES DRAWN AEROBIC AND ANAEROBIC Blood Culture adequate volume Performed at [facility], [address] [place], [place] [address]    Culture   Final    NO GROWTH 3 DAYS Performed at [facility], [address], [place], [place] [address]    Report Status PENDING  Incomplete  Blood Culture (routine x 2)     Status: None (Preliminary result)   Collection Time: 10/30/19  7:20 PM   Specimen: BLOOD  Result Value Ref Range Status   Specimen Description   Final    BLOOD RIGHT ANTECUBITAL Performed at [facility], [address], [place], [place] [address]    Special Requests   Final    BOTTLES DRAWN AEROBIC AND ANAEROBIC Blood Culture results may not be optimal due to an inadequate volume of blood received in culture bottles Performed at [facility], [address] [place], [place] [address]    Culture   Final    NO GROWTH 3 DAYS Performed at [facility], [address], [place], [place] [address]    Report Status PENDING  Incomplete    Radiology Reports CT Angio Chest PE W and/or Wo Contrast  Result Date: 10/30/2019 CLINICAL  DATA:  COVID-19 positive, worsening shortness of breath at home, O2 sat 77% on room air EXAM: CT ANGIOGRAPHY CHEST WITH CONTRAST TECHNIQUE: Multidetector CT imaging of the chest was performed using the standard protocol during bolus administration of intravenous contrast. Multiplanar CT image reconstructions and MIPs were obtained to evaluate the vascular anatomy. CONTRAST:  OMNIPAQUE IOHEXOL 350 MG/ML SOLN COMPARISON:  Radiograph 10/30/2019 FINDINGS: Cardiovascular: Satisfactory opacification the pulmonary arteries to the segmental level. No pulmonary  artery filling defects are identified. Central pulmonary arteries are normal caliber. The aortic root is suboptimally assessed given cardiac pulsation artifact. The aorta is normal caliber. No acute luminal abnormality of the imaged aorta. No periaortic stranding or hemorrhage. Normal 3 vessel branching of the aortic arch. Proximal great vessels are unremarkable. Normal heart size. No pericardial effusion. No major venous abnormalities. Mediastinum/Nodes: No mediastinal fluid or gas. Normal thyroid gland and thoracic inlet. No acute abnormality of the trachea or esophagus. Scattered borderline enlarged nodes with preserved nodal architecture within the mediastinum and hila for instance a 12 mm left paratracheal node (4/37), 10 mm right paratracheal node (4/37), and an 11 mm right hilar node (4/51) are favored to be reactive. No worrisome enlarged mediastinal, hilar or axillary adenopathy. Lungs/Pleura: Multifocal areas of mixed consolidative and ground-glass opacity with interstitial/septal thickening in a crazy paving pattern which is an often reported imaging feature COVID 19 among other infectious and inflammatory etiologies. No pneumothorax or effusion. Mild diffuse airways thickening. Upper Abdomen: No acute abnormalities present in the visualized portions of the upper abdomen. Musculoskeletal: No acute osseous abnormality or suspicious osseous lesion. Multilevel degenerative changes are present in the imaged portions of the spine. No worrisome chest wall lesions. Review of the MIP images confirms the above findings. IMPRESSION: 1. No evidence of pulmonary embolism. 2. Multifocal areas of mixed consolidative and ground-glass opacity with interstitial/septal thickening compatible with reported imaging features of COVID 19. 3. Scattered borderline enlarged nodes within the mediastinum and hila, favored to be reactive. Electronically Signed   By: Kreg Shropshire M.D.   On: 10/30/2019 20:59   DG Chest Port 1  View  Result Date: 11/01/2019 CLINICAL DATA:  COVID-19 positive with airspace opacity EXAM: PORTABLE CHEST 1 VIEW COMPARISON:  Chest radiograph and chest CT October 30, 2019 FINDINGS: Airspace opacity is again noted throughout much of the right lung with greatest concentration of opacity in the right mid and lower lung regions. Subtle opacity noted in the left mid and lower lung regions. Stable cardiac enlargement. Pulmonary vascularity normal. No adenopathy. No bone lesions. IMPRESSION: Stable multifocal airspace opacity, consistent with known atypical organism pneumonia. No new areas of opacity evident. Stable cardiac prominence. No adenopathy appreciable. Electronically Signed   By: Bretta Bang III M.D.   On: 11/01/2019 08:26   DG Chest Port 1 View  Result Date: 10/30/2019 CLINICAL DATA:  Shortness of breath.  COVID. EXAM: PORTABLE CHEST 1 VIEW COMPARISON:  10/15/2016 FINDINGS: Multifocal airspace opacities throughout the right greater than left lungs. No visible pleural effusions. No pneumothorax. Cardiac silhouette is accentuated by low lung volumes and portable technique. No acute osseous abnormality. IMPRESSION: Multifocal right greater than left airspace opacities, compatible with multifocal pneumonia and reported history of COVID. Electronically Signed   By: Feliberto Harts MD   On: 10/30/2019 19:36

## 2019-11-03 ENCOUNTER — Inpatient Hospital Stay (HOSPITAL_COMMUNITY): Payer: HRSA Program

## 2019-11-03 DIAGNOSIS — R7989 Other specified abnormal findings of blood chemistry: Secondary | ICD-10-CM

## 2019-11-03 DIAGNOSIS — U071 COVID-19: Secondary | ICD-10-CM

## 2019-11-03 DIAGNOSIS — J1282 Pneumonia due to coronavirus disease 2019: Secondary | ICD-10-CM | POA: Diagnosis not present

## 2019-11-03 LAB — CBC WITH DIFFERENTIAL/PLATELET
Abs Immature Granulocytes: 0.06 10*3/uL (ref 0.00–0.07)
Basophils Absolute: 0 10*3/uL (ref 0.0–0.1)
Basophils Relative: 0 %
Eosinophils Absolute: 0 10*3/uL (ref 0.0–0.5)
Eosinophils Relative: 0 %
HCT: 43.4 % (ref 39.0–52.0)
Hemoglobin: 14.3 g/dL (ref 13.0–17.0)
Immature Granulocytes: 1 %
Lymphocytes Relative: 14 %
Lymphs Abs: 1.7 10*3/uL (ref 0.7–4.0)
MCH: 31.6 pg (ref 26.0–34.0)
MCHC: 32.9 g/dL (ref 30.0–36.0)
MCV: 95.8 fL (ref 80.0–100.0)
Monocytes Absolute: 0.9 10*3/uL (ref 0.1–1.0)
Monocytes Relative: 8 %
Neutro Abs: 9.2 10*3/uL — ABNORMAL HIGH (ref 1.7–7.7)
Neutrophils Relative %: 77 %
Platelets: 281 10*3/uL (ref 150–400)
RBC: 4.53 MIL/uL (ref 4.22–5.81)
RDW: 11.7 % (ref 11.5–15.5)
WBC: 11.9 10*3/uL — ABNORMAL HIGH (ref 4.0–10.5)
nRBC: 0 % (ref 0.0–0.2)

## 2019-11-03 LAB — COMPREHENSIVE METABOLIC PANEL
ALT: 60 U/L — ABNORMAL HIGH (ref 0–44)
AST: 58 U/L — ABNORMAL HIGH (ref 15–41)
Albumin: 2.9 g/dL — ABNORMAL LOW (ref 3.5–5.0)
Alkaline Phosphatase: 121 U/L (ref 38–126)
Anion gap: 11 (ref 5–15)
BUN: 15 mg/dL (ref 6–20)
CO2: 27 mmol/L (ref 22–32)
Calcium: 8.9 mg/dL (ref 8.9–10.3)
Chloride: 99 mmol/L (ref 98–111)
Creatinine, Ser: 1.02 mg/dL (ref 0.61–1.24)
GFR calc Af Amer: >60 mL/min (ref >60)
GFR calc non Af Amer: >60 mL/min (ref >60)
Glucose, Bld: 128 mg/dL — ABNORMAL HIGH (ref 70–99)
Potassium: 4.4 mmol/L (ref 3.5–5.1)
Sodium: 137 mmol/L (ref 135–145)
Total Bilirubin: 1.3 mg/dL — ABNORMAL HIGH (ref 0.3–1.2)
Total Protein: 7.3 g/dL (ref 6.5–8.1)

## 2019-11-03 LAB — MAGNESIUM: Magnesium: 2.2 mg/dL (ref 1.7–2.4)

## 2019-11-03 LAB — GLUCOSE, CAPILLARY
Glucose-Capillary: 124 mg/dL — ABNORMAL HIGH (ref 70–99)
Glucose-Capillary: 124 mg/dL — ABNORMAL HIGH (ref 70–99)
Glucose-Capillary: 154 mg/dL — ABNORMAL HIGH (ref 70–99)
Glucose-Capillary: 111 mg/dL — ABNORMAL HIGH (ref 70–99)

## 2019-11-03 LAB — BRAIN NATRIURETIC PEPTIDE: B Natriuretic Peptide: 48.9 pg/mL (ref 0.0–100.0)

## 2019-11-03 LAB — D-DIMER, QUANTITATIVE: D-Dimer, Quant: 7.96 ug/mL-FEU — ABNORMAL HIGH (ref 0.00–0.50)

## 2019-11-03 LAB — HEPARIN ANTI-XA: Heparin LMW: 1.12 IU/mL

## 2019-11-03 LAB — C-REACTIVE PROTEIN: CRP: 5.7 mg/dL — ABNORMAL HIGH (ref <1.0)

## 2019-11-03 LAB — PROCALCITONIN: Procalcitonin: 0.11 ng/mL

## 2019-11-03 MED ORDER — ENOXAPARIN SODIUM 120 MG/0.8ML ~~LOC~~ SOLN
120.0000 mg | Freq: Two times a day (BID) | SUBCUTANEOUS | Status: DC
  Administered 2019-11-03 – 2019-11-05 (×4): 120 mg via SUBCUTANEOUS
  Filled 2019-11-03 (×4): qty 0.8

## 2019-11-03 MED ORDER — METHYLPREDNISOLONE SODIUM SUCC 125 MG IJ SOLR
60.0000 mg | Freq: Two times a day (BID) | INTRAMUSCULAR | Status: DC
  Administered 2019-11-03 – 2019-11-04 (×3): 60 mg via INTRAVENOUS
  Filled 2019-11-03 (×3): qty 2

## 2019-11-03 NOTE — Progress Notes (Signed)
PROGRESS NOTE                                                                                                                                                                                                             Patient Demographics:    Brandon Snow, is a 59 y.o. male, DOB - 1961/01/18, ZOX:[id number]  Outpatient Primary MD for the patient is Patient, No Pcp Per    LOS - 3  Admit date - 10/30/2019    Chief Complaint  Patient presents with   Cough   Shortness of Breath       Brief Narrative -  Brandon Snow is a 59 y.o. male with medical history significant of hypertension presented to the ED on 9/29 via EMS for evaluation of shortness of breath and cough.  Oxygen saturation 77% on room air, there he was diagnosed with acute hypoxic respiratory failure due to COVID-19 pneumonia and ARDS.  He was admitted to the hospital and placed on heated high flow.   Subjective:   Patient in bed, appears comfortable, denies any headache, no fever, no chest pain or pressure, improved shortness of breath , no abdominal pain. No focal weakness.    Assessment  & Plan :     1. Acute Hypoxic Resp. Failure due to Acute Covid 19 Viral Pneumonitis during the ongoing 2020 Covid 19 Pandemic - he is unfortunately unvaccinated and has incurred severe parenchymal lung injury with ARDS due to COVID-19 pneumonia, has been started on high-dose IV steroids, Remdesivir and Baricitinib.  Currently on heated high flow oxygen.  Will monitor closely.  Extremely tenuous.  Sepsis pathophysiology present on admission has resolved.  Encouraged the patient to sit up in chair in the daytime use I-S and flutter valve for pulmonary toiletry and then prone in bed when at night.  Will advance activity and titrate down oxygen as possible.    SpO2: 95 % O2 Flow Rate (L/min): 15 L/min FiO2 (%): 90 %  Recent Labs  Lab 10/29/19 1446 10/30/19 1918  11/01/19 0611 11/01/19 0934 11/02/19 0500  WBC  --  6.1 4.5  --  10.1  PLT  --  159 220  --  235  CRP  --  10.4* 10.4*  --  7.2*  BNP  --   --   --  31.3 46.7  DDIMER  --  3.92* 6.53*  --  >20.00*  PROCALCITON  --  <0.10  --  0.10 <0.10  AST  --  101* 97*  --  79*  ALT  --  45* 50*  --  52*  ALKPHOS  --  95 120  --  120  BILITOT  --  1.1 1.1  --  1.4*  ALBUMIN  --  3.3* 3.0*  --  3.0*  LATICACIDVEN  --  1.3  --   --   --   SARSCOV2NAA POSITIVE*  --   --   --   --      2.  Asymptomatic transaminitis due to viral infection.  Monitor.  3.  Smoking.  Counseled to quit.  4.  Stress-induced hypertension.  On as needed labetalol.  Monitor.  5.  Risisng D-dimer due to inflammation.  CTA negative, pending leg ultrasound. Continue Full dose Lovenox till D-dimer trends below 2.     Condition - Extremely Guarded  Family Communication  : [name] - [phone] on 11/01/19, 11/03/19  Code Status :  Full  Consults  :  None  Procedures  :    CTA - no PE  Leg [place]  PUD Prophylaxis :   Disposition Plan  :    Status is: Inpatient  Remains inpatient appropriate because:IV treatments appropriate due to intensity of illness or inability to take PO   Dispo: The patient is from: Home              Anticipated d/c is to: Home              Anticipated d/c date is: > 3 days              Patient currently is not medically stable to d/c.      DVT Prophylaxis  :  Lovenox   Lab Results  Component Value Date   PLT 235 11/02/2019    Diet :  Diet Order            Diet Heart Room service appropriate? Yes; Fluid consistency: Thin  Diet effective now                  Inpatient Medications  Scheduled Meds:  vitamin C  500 mg Oral Daily   baricitinib  4 mg Oral Daily   cholecalciferol  1,000 Units Oral Daily   enoxaparin (LOVENOX) injection  130 mg Subcutaneous Q12H   influenza vac split quadrivalent PF  0.5 mL Intramuscular Tomorrow-1000   insulin aspart   0-15 Units Subcutaneous TID WC   insulin aspart  0-5 Units Subcutaneous QHS   methylPREDNISolone (SOLU-MEDROL) injection  60 mg Intravenous Q12H   nicotine  14 mg Transdermal Daily   pneumococcal 23 valent vaccine  0.5 mL Intramuscular Tomorrow-1000   zinc sulfate  220 mg Oral Daily   Continuous Infusions:  sodium chloride Stopped (10/31/19 1114)   remdesivir 100 mg in NS 100 mL 100 mg (11/02/19 0913)   PRN Meds:.sodium chloride, acetaminophen, albuterol, chlorpheniramine-HYDROcodone, guaiFENesin-dextromethorphan, labetalol, lip balm  Antibiotics  :    Anti-infectives (From admission, onward)   Start     Dose/Rate Route Frequency Ordered Stop   10/31/19 1000  remdesivir 100 mg in sodium chloride 0.9 % 100 mL IVPB        100 mg 200 mL/hr over 30 Minutes Intravenous Daily 10/30/19 1851 11/04/19 0959   10/30/19 1930  remdesivir 100 mg in sodium chloride 0.9 % 100  mL IVPB       "Followed by" Linked Group Details   100 mg 200 mL/hr over 30 Minutes Intravenous  Once 10/30/19 1851 10/30/19 2106   10/30/19 1900  remdesivir 100 mg in sodium chloride 0.9 % 100 mL IVPB       "Followed by" Linked Group Details   100 mg 200 mL/hr over 30 Minutes Intravenous  Once 10/30/19 1851 10/30/19 2106       Time Spent in minutes  30   Susa Raring M.D on 11/03/2019 at 10:30 AM  To page go to [email or web address] - password [facility]  [facility] -  Office  [phone]     See all Orders from today for further details    Objective:   Vitals:   11/03/19 0518 11/03/19 0524 11/03/19 0746 11/03/19 1002  BP: 132/77 120/79 137/79 116/78  Pulse: (!) 102 98 (!) 101 (!) 114  Resp: (!) 28 (!) 22 (!) 24 (!) 32  Temp: 98 F (36.7 C) 98 F (36.7 C) 98.1 F (36.7 C) 98.6 F (37 C)  TempSrc: Oral Oral Oral Oral  SpO2: 91% 91% 96% 95%  Weight:      Height:        Wt Readings from Last 3 Encounters:  10/31/19 130.5 kg  12/22/18 124.7 kg  10/15/16 124.7 kg     Intake/Output Summary  (Last 24 hours) at 11/03/2019 1030 Last data filed at 11/03/2019 0515 Gross per 24 hour  Intake --  Output 1000 ml  Net -1000 ml     Physical Exam  Awake Alert, No new F.N deficits, Normal affect [place].AT,PERRAL Supple Neck,No JVD, No cervical lymphadenopathy appriciated.  Symmetrical Chest wall movement, Good air movement bilaterally, CTAB RRR,No Gallops, Rubs or new Murmurs, No Parasternal Heave +ve B.Sounds, Abd Soft, No tenderness, No organomegaly appriciated, No rebound - guarding or rigidity. No Cyanosis, Clubbing or edema, No new Rash or bruise     Data Review:    CBC Recent Labs  Lab 10/30/19 1918 11/01/19 0611 11/02/19 0500  WBC 6.1 4.5 10.1  HGB 14.3 15.2 15.1  HCT 43.1 46.6 45.1  PLT 159 220 235  MCV 95.8 97.1 96.8  MCH 31.8 31.7 32.4  MCHC 33.2 32.6 33.5  RDW 11.8 11.8 11.7  LYMPHSABS 1.0 1.0 1.3  MONOABS 0.3 0.3 0.7  EOSABS 0.0 0.0 0.0  BASOSABS 0.0 0.0 0.0    Recent Labs  Lab 10/30/19 1918 11/01/19 0611 11/01/19 0934 11/02/19 0500  NA 134* 140  --  136  K 4.0 5.1  --  4.3  CL 98 99  --  97*  CO2 25 28  --  26  GLUCOSE 114* 172*  --  157*  BUN 9 12  --  13  CREATININE 0.98 1.09  --  1.02  CALCIUM 8.2* 8.9  --  8.8*  AST 101* 97*  --  79*  ALT 45* 50*  --  52*  ALKPHOS 95 120  --  120  BILITOT 1.1 1.1  --  1.4*  ALBUMIN 3.3* 3.0*  --  3.0*  MG  --   --  2.3 2.1  CRP 10.4* 10.4*  --  7.2*  DDIMER 3.92* 6.53*  --  >20.00*  PROCALCITON <0.10  --  0.10 <0.10  LATICACIDVEN 1.3  --   --   --   HGBA1C  --   --  5.6  --   BNP  --   --  31.3 46.7    ------------------------------------------------------------------------------------------------------------------  No results for input(s): CHOL, HDL, LDLCALC, TRIG, CHOLHDL, LDLDIRECT in the last 72 hours.  Lab Results  Component Value Date   HGBA1C 5.6 11/01/2019   ------------------------------------------------------------------------------------------------------------------ No results for  input(s): TSH, T4TOTAL, T3FREE, THYROIDAB in the last 72 hours.  Invalid input(s): FREET3  Cardiac Enzymes No results for input(s): CKMB, TROPONINI, MYOGLOBIN in the last 168 hours.  Invalid input(s): CK ------------------------------------------------------------------------------------------------------------------    Component Value Date/Time   BNP 46.7 11/02/2019 0500    Micro Results Recent Results (from the past 240 hour(s))  SARS CORONAVIRUS 2 (TAT 6-24 HRS) Nasopharyngeal Nasopharyngeal Swab     Status: Abnormal   Collection Time: 10/29/19  2:46 PM   Specimen: Nasopharyngeal Swab  Result Value Ref Range Status   SARS Coronavirus 2 POSITIVE (A) NEGATIVE Final    Comment: RESULT CALLED TO, READ BACK BY AND VERIFIED WITH: EMAILED MELISSA BROWNING 0127 10/30/2019 T. TYSOR (NOTE) SARS-CoV-2 target nucleic acids are DETECTED.  The SARS-CoV-2 RNA is generally detectable in upper and lower respiratory specimens during the acute phase of infection. Positive results are indicative of the presence of SARS-CoV-2 RNA. Clinical correlation with patient history and other diagnostic information is  necessary to determine patient infection status. Positive results do not rule out bacterial infection or co-infection with other viruses.  The expected result is Negative.  Fact Sheet for Patients: HairSlick.no  Fact Sheet for Healthcare Providers: quierodirigir.com  This test is not yet approved or cleared by the [place] FDA and  has been authorized for detection and/or diagnosis of SARS-CoV-2 by FDA under an Emergency Use Authorization (EUA). This EUA will remain  in effect (meaning this tes t can be used) for the duration of the COVID-19 declaration under Section 564(b)(1) of the Act, 21 U.S.C. section 360bbb-3(b)(1), unless the authorization is terminated or revoked sooner.   Performed at [facility], [address], [place], [place] [address]   Blood Culture (routine x 2)     Status: None (Preliminary result)   Collection Time: 10/30/19  7:00 PM   Specimen: BLOOD RIGHT HAND  Result Value Ref Range Status   Specimen Description   Final    BLOOD RIGHT HAND Performed at [facility], [address] [place], [place] [address]    Special Requests   Final    BOTTLES DRAWN AEROBIC AND ANAEROBIC Blood Culture adequate volume Performed at [facility], [address] [place], [place] [address]    Culture   Final    NO GROWTH 3 DAYS Performed at [facility], [address] [place], [place] [address]    Report Status PENDING  Incomplete  Blood Culture (routine x 2)     Status: None (Preliminary result)   Collection Time: 10/30/19  7:20 PM   Specimen: BLOOD  Result Value Ref Range Status   Specimen Description   Final    BLOOD RIGHT ANTECUBITAL Performed at [facility], [address] [place], [place] [address]    Special Requests   Final    BOTTLES DRAWN AEROBIC AND ANAEROBIC Blood Culture results may not be optimal due to an inadequate volume of blood received in culture bottles Performed at [facility], [address] [place], [place] [address]    Culture   Final    NO GROWTH 3 DAYS Performed at [facility], [address] [place], [place] [address]    Report Status PENDING  Incomplete    Radiology Reports CT Angio Chest PE W and/or Wo Contrast  Result Date: 10/30/2019 CLINICAL DATA:  COVID-19 positive, worsening shortness of breath at home, O2 sat 77% on room air EXAM: CT ANGIOGRAPHY CHEST WITH CONTRAST TECHNIQUE: Multidetector CT imaging of the chest was performed using the standard protocol during bolus administration of intravenous contrast. Multiplanar CT image reconstructions and MIPs were obtained to evaluate the vascular anatomy. CONTRAST:  100mL OMNIPAQUE IOHEXOL 350 MG/ML SOLN COMPARISON:  Radiograph 10/30/2019  FINDINGS: Cardiovascular: Satisfactory opacification the pulmonary arteries to the segmental level. No pulmonary artery filling defects are identified. Central pulmonary arteries are normal caliber. The aortic root is suboptimally assessed given cardiac pulsation artifact. The aorta is normal caliber. No acute luminal abnormality of the imaged aorta. No periaortic stranding or hemorrhage. Normal 3 vessel branching of the aortic arch. Proximal great vessels are unremarkable. Normal heart size. No pericardial effusion. No major venous abnormalities. Mediastinum/Nodes: No mediastinal fluid or gas. Normal thyroid gland and thoracic inlet. No acute abnormality of the trachea or esophagus. Scattered borderline enlarged nodes with preserved nodal architecture within the mediastinum and hila for instance a 12 mm left paratracheal node (4/37), 10 mm right paratracheal node (4/37), and an 11 mm right hilar node (4/51) are favored to be reactive. No worrisome enlarged mediastinal, hilar or axillary adenopathy. Lungs/Pleura: Multifocal areas of mixed consolidative and ground-glass opacity with interstitial/septal thickening in a crazy paving pattern which is an often reported imaging feature COVID 19 among other infectious and inflammatory etiologies. No pneumothorax or effusion. Mild diffuse airways thickening. Upper Abdomen: No acute abnormalities present in the visualized portions of the upper abdomen. Musculoskeletal: No acute osseous abnormality or suspicious osseous lesion. Multilevel degenerative changes are present in the imaged portions of the spine. No worrisome chest wall lesions. Review of the MIP images confirms the above findings. IMPRESSION: 1. No evidence of pulmonary embolism. 2. Multifocal areas of mixed consolidative and ground-glass opacity with interstitial/septal thickening compatible with reported imaging features of COVID 19. 3. Scattered borderline enlarged nodes within the mediastinum and hila, favored  to be reactive. Electronically Signed   By: Kreg ShropshirePrice  DeHay M.D.   On: 10/30/2019 20:59   DG Chest Port 1 View  Result Date: 11/01/2019 CLINICAL DATA:  COVID-19 positive with airspace opacity EXAM: PORTABLE CHEST 1 VIEW COMPARISON:  Chest radiograph and chest CT October 30, 2019 FINDINGS: Airspace opacity is again noted throughout much of the right lung with greatest concentration of opacity in the right mid and lower lung regions. Subtle opacity noted in the left mid and lower lung regions. Stable cardiac enlargement. Pulmonary vascularity normal. No adenopathy. No bone lesions. IMPRESSION: Stable multifocal airspace opacity, consistent with known atypical organism pneumonia. No new areas of opacity evident. Stable cardiac prominence. No adenopathy appreciable. Electronically Signed   By: Bretta BangWilliam  Woodruff III M.D.   On: 11/01/2019 08:26   DG Chest Port 1 View  Result Date: 10/30/2019 CLINICAL DATA:  Shortness of breath.  COVID. EXAM: PORTABLE CHEST 1 VIEW COMPARISON:  10/15/2016 FINDINGS: Multifocal airspace opacities throughout the right greater than left lungs. No visible pleural effusions. No pneumothorax. Cardiac silhouette is accentuated by low lung volumes and portable technique. No acute osseous abnormality. IMPRESSION: Multifocal right greater than left airspace opacities, compatible with multifocal pneumonia and reported history of COVID. Electronically Signed   By: Feliberto HartsFrederick S Jones MD   On: 10/30/2019 19:36

## 2019-11-03 NOTE — Progress Notes (Signed)
Lower extremity venous bilateral study completed.   Please see CV Proc for preliminary results.   Quantel Mcinturff, RDMS  

## 2019-11-03 NOTE — Progress Notes (Addendum)
ANTICOAGULATION CONSULT NOTE - Follow Up Consult  Pharmacy Consult for Lovenox Indication: Empiriv VTE treatment with elevated D-dimer, LE ultrasound pending  No Known Allergies  Patient Measurements: Height: 5\' 9"  (175.3 cm) Weight: 130.5 kg (287 lb 12.8 oz) IBW/kg (Calculated) : 70.7  Vital Signs: Temp: 98.7 F (37.1 C) (10/03 1351) Temp Source: Oral (10/03 1351) BP: 111/60 (10/03 1351) Pulse Rate: 100 (10/03 1351)  Labs: Recent Labs    11/01/19 0611 11/01/19 0611 11/02/19 0500 11/03/19 1027  HGB 15.2   < > 15.1 14.3  HCT 46.6  --  45.1 43.4  PLT 220  --  235 281  HEPRLOWMOCWT  --   --   --  1.12  CREATININE 1.09  --  1.02 1.02   < > = values in this interval not displayed.    Estimated Creatinine Clearance: 108.2 mL/min (by C-G formula based on SCr of 1.02 mg/dL).  Assessment: 59 year old male with COVID on empiric VTE treatment Lovenox for rising D-dimer and pending LE dopplers. CTA negative for PE.   LMWH today at 1.12 drawn ~ 6hrs post dose - slightly above upper limit of goal 0.6 to 1.  SCr 1.02 - stable. CBC is stable. No bleeding noted.   Goal of Therapy:  Anti-Xa level 0.6-1 units/ml 4hrs after LMWH dose given Monitor platelets by anticoagulation protocol: Yes   Plan:  Decrease Lovenox to 120mg  SQ every 12 hours.  Continue to monitor CBC and renal function  61, PharmD, BCPS, BCCCP Clinical Pharmacist Please refer to [facility] for [facility] Pharmacy numbers 11/03/2019,2:35 PM

## 2019-11-03 NOTE — Evaluation (Signed)
Occupational Therapy Evaluation Patient Details Name: Brandon Snow MRN: [id number] DOB: 1960/08/16 Today's Date: 11/03/2019    History of Present Illness 59yo male c/o SOB and cough. Hypoxic to 77% in the ED on RA. PE negative. Covid positive. Unvaccinated.  PMH HTN   Clinical Impression   Pt admitted with above. He demonstrates the below listed deficits and will benefit from continued OT to maximize safety and independence with BADLs.  Pt presents to OT with generalized weakness, decreased activity tolerance, mild balance deficits.  He currently independent - mod A for ADLs as he experiences DOE when leaning forward to access feet and fatigues rapidly.  Pt had one episode in which sats decreased to 74%, but was able to recover to mid 90s with pursed lip breathing.  The remainder of the time, he maintained sats in mid 90s on 15L supplemental 02.  He demonstrates incorporation of breathing techniques during activities and independently paces himself.   He was provided with initial HEP using level 2 theraband.  He reports he lives with his fiancee' and was fully independent with ADLs and IADLs.  Will follow acutely.       Follow Up Recommendations  No OT follow up;Supervision/Assistance - 24 hour    Equipment Recommendations  Tub/shower seat    Recommendations for Other Services       Precautions / Restrictions Precautions Precautions: Other (comment) Precaution Comments: watch sats, Covid +      Mobility Bed Mobility               General bed mobility comments: pt sitting up in recliner   Transfers Overall transfer level: Needs assistance Equipment used: None Transfers: Sit to/from Stand;Stand Pivot Transfers Sit to Stand: Min guard Stand pivot transfers: Min guard       General transfer comment: min guard for safety     Balance Overall balance assessment: Needs assistance Sitting-balance support: Feet supported Sitting balance-Leahy Scale: Good      Standing balance support: No upper extremity supported Standing balance-Leahy Scale: Fair Standing balance comment: able to maintain static standing with min guard assist                            ADL either performed or assessed with clinical judgement   ADL Overall ADL's : Needs assistance/impaired Eating/Feeding: Independent   Grooming: Oral care;Wash/dry face;Wash/dry hands;Brushing hair;Set up;Sitting   Upper Body Bathing: Minimal assistance;Sitting   Lower Body Bathing: Moderate assistance;Sit to/from stand   Upper Body Dressing : Minimal assistance;Sitting   Lower Body Dressing: Moderate assistance;Sit to/from stand   Toilet Transfer: Min guard;Stand-pivot;BSC   Toileting- Architect and Hygiene: Min guard;Sit to/from stand;Sitting/lateral lean       Functional mobility during ADLs: Min guard General ADL Comments: Pt with DOE when bending forward to access feet.  Fatigues quickly with activity      Vision Patient Visual Report: No change from baseline       Perception     Praxis      Pertinent Vitals/Pain Pain Assessment: No/denies pain     Hand Dominance Right   Extremity/Trunk Assessment Upper Extremity Assessment Upper Extremity Assessment: Generalized weakness   Lower Extremity Assessment Lower Extremity Assessment: Generalized weakness   Cervical / Trunk Assessment Cervical / Trunk Assessment: Normal   Communication Communication Communication: No difficulties   Cognition Arousal/Alertness: Awake/alert Behavior During Therapy: WFL for tasks assessed/performed Overall Cognitive Status: Within Functional Limits for tasks assessed  General Comments  Pt on 15L supplemental [place] 02.  Sats decreased from 96-74% when leaning forward to move into unsupported sitting EOC.  Sats rebounded to mid 90s with ~2 mins pursed lip breathing.   Sats remained >94% with remainder of  activity.  Pt performing pursed lip breathing consistently.      Exercises Exercises: Other exercises Other Exercises Other Exercises: Pt ambulated forward and back 84ft x 3 with DOE 4/4, and requiring multiple standing rest breaks.  Sats remained mid 90s  Other Exercises: Pt provided with level 2 theraband.  He performed 5 reps horizontal abuction using bil. UEs, and 2 reps shoulder flexion with Rt UE before fatiguing and DOE 4/4.  sats remained mid 90s.  Instructed pt to peform at least 4-6x/day    Shoulder Instructions      Home Living Family/patient expects to be discharged to:: Private residence Living Arrangements: Spouse/significant other Available Help at Discharge: Family;Friend(s);Available PRN/intermittently Type of Home: Apartment Home Access: Stairs to enter Entrance Stairs-Number of Steps: flight with right ascending rail Entrance Stairs-Rails: Right Home Layout: One level     Bathroom Shower/Tub: Chief Strategy Officer: Standard     Home Equipment: None   Additional Comments: Pt reports he lives with his fiancee'       Prior Functioning/Environment Level of Independence: Independent        Comments: 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         OT Problem List: Decreased strength;Decreased activity tolerance;Impaired balance (sitting and/or standing);Decreased knowledge of use of DME or AE;Cardiopulmonary status limiting activity      OT Treatment/Interventions: Self-care/ADL training;Therapeutic exercise;DME and/or AE instruction;Energy conservation;Therapeutic activities;Patient/family education;Balance training    OT Goals(Current goals can be found in the care plan section) Acute Rehab OT Goals Patient Stated Goal: to reduce 02 and go home  OT Goal Formulation: With patient Time For Goal Achievement: 11/17/19 Potential to Achieve Goals: Good ADL Goals Pt Will Perform Grooming: with supervision;standing Pt Will Perform Upper Body Bathing:  with set-up;sitting Pt Will Perform Lower Body Bathing: with supervision;sit to/from stand Pt Will Perform Upper Body Dressing: with set-up;sitting Pt Will Perform Lower Body Dressing: with supervision;sit to/from stand Pt Will Transfer to Toilet: with supervision;ambulating;regular height toilet;bedside commode;grab bars Pt Will Perform Toileting - Clothing Manipulation and hygiene: with supervision;sit to/from stand Pt Will Perform Tub/Shower Transfer: Tub transfer;with supervision;ambulating;shower seat;tub bench Pt/caregiver will Perform Home Exercise Program: Increased strength;Right Upper extremity;Left upper extremity;With theraband;Independently Additional ADL Goal #1: Pt will tolerate 25 mins therapeutic activity with no more than 4 rest breaks and DOE no >3/4 Additional ADL Goal #2: Pt will independently incorporate 3 energy conservation techniques into daily activities  OT Frequency: Min 2X/week   Barriers to D/C:            Co-evaluation              AM-PAC OT "6 Clicks" Daily Activity     Outcome Measure Help from another person eating meals?: None Help from another person taking care of personal grooming?: A Little Help from another person toileting, which includes using toliet, bedpan, or urinal?: A Little Help from another person bathing (including washing, rinsing, drying)?: A Lot Help from another person to put on and taking off regular upper body clothing?: A Little Help from another person to put on and taking off regular lower body clothing?: A Lot 6 Click Score: 17   End of Session Equipment Utilized During Treatment: Oxygen Nurse Communication:  Mobility status  Activity Tolerance: Patient limited by fatigue Patient left: in chair;with call bell/phone within reach  OT Visit Diagnosis: Unsteadiness on feet (R26.81)                Time: 6295-2841 OT Time Calculation (min): 43 min Charges:  OT General Charges $OT Visit: 1 Visit OT Evaluation $OT Eval  Moderate Complexity: 1 Mod OT Treatments $Therapeutic Activity: 23-37 mins  Eber Jones., OTR/L Acute Rehabilitation Services Pager [phone] Office [phone]   Jeani Hawking M 11/03/2019, 5:17 PM

## 2019-11-04 DIAGNOSIS — J1282 Pneumonia due to coronavirus disease 2019: Secondary | ICD-10-CM | POA: Diagnosis not present

## 2019-11-04 DIAGNOSIS — U071 COVID-19: Secondary | ICD-10-CM | POA: Diagnosis not present

## 2019-11-04 LAB — COMPREHENSIVE METABOLIC PANEL
ALT: 60 U/L — ABNORMAL HIGH (ref 0–44)
AST: 48 U/L — ABNORMAL HIGH (ref 15–41)
Albumin: 2.8 g/dL — ABNORMAL LOW (ref 3.5–5.0)
Alkaline Phosphatase: 125 U/L (ref 38–126)
Anion gap: 10 (ref 5–15)
BUN: 16 mg/dL (ref 6–20)
CO2: 26 mmol/L (ref 22–32)
Calcium: 8.8 mg/dL — ABNORMAL LOW (ref 8.9–10.3)
Chloride: 99 mmol/L (ref 98–111)
Creatinine, Ser: 1.04 mg/dL (ref 0.61–1.24)
GFR calc Af Amer: >60 mL/min (ref >60)
GFR calc non Af Amer: >60 mL/min (ref >60)
Glucose, Bld: 157 mg/dL — ABNORMAL HIGH (ref 70–99)
Potassium: 4.5 mmol/L (ref 3.5–5.1)
Sodium: 135 mmol/L (ref 135–145)
Total Bilirubin: 1 mg/dL (ref 0.3–1.2)
Total Protein: 7.4 g/dL (ref 6.5–8.1)

## 2019-11-04 LAB — CBC WITH DIFFERENTIAL/PLATELET
Abs Immature Granulocytes: 0.08 10*3/uL — ABNORMAL HIGH (ref 0.00–0.07)
Basophils Absolute: 0 10*3/uL (ref 0.0–0.1)
Basophils Relative: 0 %
Eosinophils Absolute: 0 10*3/uL (ref 0.0–0.5)
Eosinophils Relative: 0 %
HCT: 43 % (ref 39.0–52.0)
Hemoglobin: 14.5 g/dL (ref 13.0–17.0)
Immature Granulocytes: 1 %
Lymphocytes Relative: 9 %
Lymphs Abs: 1.1 10*3/uL (ref 0.7–4.0)
MCH: 32.4 pg (ref 26.0–34.0)
MCHC: 33.7 g/dL (ref 30.0–36.0)
MCV: 96.2 fL (ref 80.0–100.0)
Monocytes Absolute: 0.9 10*3/uL (ref 0.1–1.0)
Monocytes Relative: 8 %
Neutro Abs: 9.5 10*3/uL — ABNORMAL HIGH (ref 1.7–7.7)
Neutrophils Relative %: 82 %
Platelets: 302 10*3/uL (ref 150–400)
RBC: 4.47 MIL/uL (ref 4.22–5.81)
RDW: 11.8 % (ref 11.5–15.5)
WBC: 11.6 10*3/uL — ABNORMAL HIGH (ref 4.0–10.5)
nRBC: 0 % (ref 0.0–0.2)

## 2019-11-04 LAB — PROCALCITONIN: Procalcitonin: 0.14 ng/mL

## 2019-11-04 LAB — MAGNESIUM: Magnesium: 2.2 mg/dL (ref 1.7–2.4)

## 2019-11-04 LAB — GLUCOSE, CAPILLARY
Glucose-Capillary: 111 mg/dL — ABNORMAL HIGH (ref 70–99)
Glucose-Capillary: 154 mg/dL — ABNORMAL HIGH (ref 70–99)
Glucose-Capillary: 148 mg/dL — ABNORMAL HIGH (ref 70–99)
Glucose-Capillary: 181 mg/dL — ABNORMAL HIGH (ref 70–99)

## 2019-11-04 LAB — CULTURE, BLOOD (ROUTINE X 2)
Culture: NO GROWTH
Culture: NO GROWTH
Special Requests: ADEQUATE

## 2019-11-04 LAB — C-REACTIVE PROTEIN: CRP: 9.1 mg/dL — ABNORMAL HIGH (ref <1.0)

## 2019-11-04 LAB — D-DIMER, QUANTITATIVE: D-Dimer, Quant: 4.58 ug/mL-FEU — ABNORMAL HIGH (ref 0.00–0.50)

## 2019-11-04 LAB — BRAIN NATRIURETIC PEPTIDE: B Natriuretic Peptide: 50.9 pg/mL (ref 0.0–100.0)

## 2019-11-04 MED ORDER — METHYLPREDNISOLONE SODIUM SUCC 125 MG IJ SOLR
60.0000 mg | Freq: Every day | INTRAMUSCULAR | Status: DC
  Administered 2019-11-05 – 2019-11-06 (×2): 60 mg via INTRAVENOUS
  Filled 2019-11-04 (×2): qty 2

## 2019-11-04 MED ORDER — FUROSEMIDE 10 MG/ML IJ SOLN
40.0000 mg | Freq: Once | INTRAMUSCULAR | Status: AC
  Administered 2019-11-04: 40 mg via INTRAVENOUS
  Filled 2019-11-04: qty 4

## 2019-11-04 MED ORDER — METOPROLOL TARTRATE 25 MG PO TABS
25.0000 mg | ORAL_TABLET | Freq: Two times a day (BID) | ORAL | Status: DC
  Administered 2019-11-04 – 2019-11-05 (×3): 25 mg via ORAL
  Filled 2019-11-04 (×3): qty 1

## 2019-11-04 MED ORDER — SALINE SPRAY 0.65 % NA SOLN
1.0000 | NASAL | Status: DC | PRN
  Administered 2019-11-04 – 2019-11-11 (×4): 1 via NASAL
  Filled 2019-11-04: qty 44

## 2019-11-04 NOTE — Progress Notes (Signed)
Attempted walking test with pt but was unable to make it out of room with pt. Before walking test was begun pt was on 2 L/min HFNC with SpO2 at 96-98% while resting. Pt exerted himself trying to let down the lever on the recliner chair and desated to 85%. Increased O2 to 4 L/min and pt's SpO2 increased to 90%. When pt stood up to start walking test, pt's SpO2 dropped to 79% immediately. Increased pt's O2 to 8 L/min and coached pt with his breathing. SpO2 rose to 85%. Begin to walk with pt to his room door and pt's SpO2 dropped back into the 70s and pt became tachypnea and pt verbalized feeling SOB. Increased pt's O2 to 15 L/min until his SpO2 increased and respirations decreased. Pt currently on 10 L/min. Will attempt to decrease down as pt tolerates.

## 2019-11-04 NOTE — Progress Notes (Signed)
PROGRESS NOTE                                                                                                                                                                                                             Patient Demographics:    Brandon Snow, is a 59 y.o. male, DOB - October 16, 1960, VOJ:[id number]  Outpatient Primary MD for the patient is Patient, No Pcp Per    LOS - 4  Admit date - 10/30/2019    Chief Complaint  Patient presents with   Cough   Shortness of Breath       Brief Narrative -  Brandon Snow is a 59 y.o. male with medical history significant of hypertension presented to the ED on 9/29 via EMS for evaluation of shortness of breath and cough.  Oxygen saturation 77% on room air, there he was diagnosed with acute hypoxic respiratory failure due to COVID-19 pneumonia and ARDS.  He was admitted to the hospital and placed on heated high flow.   Subjective:   Patient in bed, appears comfortable, denies any headache, no fever, no chest pain or pressure, improved shortness of breath , no abdominal pain. No focal weakness.    Assessment  & Plan :     1. Acute Hypoxic Resp. Failure due to Acute Covid 19 Viral Pneumonitis during the ongoing 2020 Covid 19 Pandemic - he is unfortunately unvaccinated and has incurred severe parenchymal lung injury with ARDS due to COVID-19 pneumonia, has been started on high-dose IV Steroids, Remdesivir and Baricitinib.  Shown good improvement now down to 4 L nasal cannula oxygen we will continue to monitor.  Encouraged the patient to sit up in chair in the daytime use I-S and flutter valve for pulmonary toiletry and then prone in bed when at night.  Will advance activity and titrate down oxygen as possible.    SpO2: 97 % O2 Flow Rate (L/min): 4 L/min FiO2 (%): 90 %  Recent Labs  Lab 10/29/19 1446 10/30/19 1918 11/01/19 0611 11/01/19 0934 11/02/19 0500  11/03/19 1027 11/04/19 0445  WBC  --  6.1 4.5  --  10.1 11.9* 11.6*  PLT  --  159 220  --  235 281 302  CRP  --  10.4* 10.4*  --  7.2* 5.7* 9.1*  BNP  --   --   --  31.3 46.7 48.9 50.9  DDIMER  --  3.92* 6.53*  --  >20.00* 7.96* 4.58*  PROCALCITON  --  <0.10  --  0.10 <0.10 0.11 0.14  AST  --  101* 97*  --  79* 58* 48*  ALT  --  45* 50*  --  52* 60* 60*  ALKPHOS  --  95 120  --  120 121 125  BILITOT  --  1.1 1.1  --  1.4* 1.3* 1.0  ALBUMIN  --  3.3* 3.0*  --  3.0* 2.9* 2.8*  LATICACIDVEN  --  1.3  --   --   --   --   --   SARSCOV2NAA POSITIVE*  --   --   --   --   --   --      2.  Asymptomatic transaminitis due to viral infection.  Monitor.  3.  Smoking.  Counseled to quit.  4.  Stress-induced hypertension.  On as needed labetalol.  Monitor.  5.  Risisng D-dimer due to inflammation.  CTA negative, -ve leg ultrasound. Continue Full dose Lovenox till D-dimer trends below 2.     Condition - Extremely Guarded  Family Communication  : [name] - [phone] on 11/01/19, 11/03/19  Code Status :  Full  Consults  :  None  Procedures  :    CTA - no PE  Leg [place] - No DVT  PUD Prophylaxis :   Disposition Plan  :    Status is: Inpatient  Remains inpatient appropriate because:IV treatments appropriate due to intensity of illness or inability to take PO   Dispo: The patient is from: Home              Anticipated d/c is to: Home              Anticipated d/c date is: > 3 days              Patient currently is not medically stable to d/c.      DVT Prophylaxis  :  Lovenox   Lab Results  Component Value Date   PLT 302 11/04/2019    Diet :  Diet Order            Diet Heart Room service appropriate? Yes; Fluid consistency: Thin  Diet effective now                  Inpatient Medications  Scheduled Meds:  vitamin C  500 mg Oral Daily   baricitinib  4 mg Oral Daily   cholecalciferol  1,000 Units Oral Daily   enoxaparin (LOVENOX) injection  120 mg  Subcutaneous Q12H   influenza vac split quadrivalent PF  0.5 mL Intramuscular Tomorrow-1000   insulin aspart  0-15 Units Subcutaneous TID WC   insulin aspart  0-5 Units Subcutaneous QHS   [START ON 11/05/2019] methylPREDNISolone (SOLU-MEDROL) injection  60 mg Intravenous Daily   metoprolol tartrate  25 mg Oral BID   nicotine  14 mg Transdermal Daily   pneumococcal 23 valent vaccine  0.5 mL Intramuscular Tomorrow-1000   zinc sulfate  220 mg Oral Daily   Continuous Infusions:  sodium chloride Stopped (10/31/19 1114)   PRN Meds:.sodium chloride, acetaminophen, albuterol, chlorpheniramine-HYDROcodone, guaiFENesin-dextromethorphan, labetalol, lip balm  Antibiotics  :    Anti-infectives (From admission, onward)   Start     Dose/Rate Route Frequency Ordered Stop   10/31/19 1000  remdesivir 100 mg in sodium chloride 0.9 % 100 mL IVPB  100 mg 200 mL/hr over 30 Minutes Intravenous Daily 10/30/19 1851 11/03/19 1124   10/30/19 1930  remdesivir 100 mg in sodium chloride 0.9 % 100 mL IVPB       "Followed by" Linked Group Details   100 mg 200 mL/hr over 30 Minutes Intravenous  Once 10/30/19 1851 10/30/19 2106   10/30/19 1900  remdesivir 100 mg in sodium chloride 0.9 % 100 mL IVPB       "Followed by" Linked Group Details   100 mg 200 mL/hr over 30 Minutes Intravenous  Once 10/30/19 1851 10/30/19 2106       Time Spent in minutes  30   Susa RaringPrashant Umair Rosiles M.D on 11/04/2019 at 10:00 AM  To page go to [email or web address] - password [email or web address]  [facility] -  Office  [phone]     See all Orders from today for further details    Objective:   Vitals:   11/04/19 0437 11/04/19 0741 11/04/19 0804 11/04/19 0822  BP: 127/84   122/82  Pulse: (!) 105   (!) 105  Resp: (!) 21   20  Temp: 98.3 F (36.8 C)   98 F (36.7 C)  TempSrc: Oral   Oral  SpO2: 97% 98% 97% 97%  Weight:      Height:        Wt Readings from Last 3 Encounters:  10/31/19 130.5 kg  12/22/18 124.7 kg  10/15/16  124.7 kg     Intake/Output Summary (Last 24 hours) at 11/04/2019 1000 Last data filed at 11/04/2019 [id number] Gross per 24 hour  Intake --  Output 2330 ml  Net -2330 ml     Physical Exam  Awake Alert, No new F.N deficits, Normal affect .AT,PERRAL Supple Neck,No JVD, No cervical lymphadenopathy appriciated.  Symmetrical Chest wall movement, Good air movement bilaterally, CTAB RRR,No Gallops, Rubs or new Murmurs, No Parasternal Heave +ve B.Sounds, Abd Soft, No tenderness, No organomegaly appriciated, No rebound - guarding or rigidity. No Cyanosis, Clubbing or edema, No new Rash or bruise    Data Review:    CBC Recent Labs  Lab 10/30/19 1918 11/01/19 0611 11/02/19 0500 11/03/19 1027 11/04/19 0445  WBC 6.1 4.5 10.1 11.9* 11.6*  HGB 14.3 15.2 15.1 14.3 14.5  HCT 43.1 46.6 45.1 43.4 43.0  PLT 159 220 235 281 302  MCV 95.8 97.1 96.8 95.8 96.2  MCH 31.8 31.7 32.4 31.6 32.4  MCHC 33.2 32.6 33.5 32.9 33.7  RDW 11.8 11.8 11.7 11.7 11.8  LYMPHSABS 1.0 1.0 1.3 1.7 1.1  MONOABS 0.3 0.3 0.7 0.9 0.9  EOSABS 0.0 0.0 0.0 0.0 0.0  BASOSABS 0.0 0.0 0.0 0.0 0.0    Recent Labs  Lab 10/30/19 1918 11/01/19 0611 11/01/19 0934 11/02/19 0500 11/03/19 1027 11/04/19 0445  NA 134* 140  --  136 137 135  K 4.0 5.1  --  4.3 4.4 4.5  CL 98 99  --  97* 99 99  CO2 25 28  --  26 27 26   GLUCOSE 114* 172*  --  157* 128* 157*  BUN 9 12  --  13 15 16   CREATININE 0.98 1.09  --  1.02 1.02 1.04  CALCIUM 8.2* 8.9  --  8.8* 8.9 8.8*  AST 101* 97*  --  79* 58* 48*  ALT 45* 50*  --  52* 60* 60*  ALKPHOS 95 120  --  120 121 125  BILITOT 1.1 1.1  --  1.4* 1.3* 1.0  ALBUMIN 3.3* 3.0*  --  3.0*  2.9* 2.8*  MG  --   --  2.3 2.1 2.2 2.2  CRP 10.4* 10.4*  --  7.2* 5.7* 9.1*  DDIMER 3.92* 6.53*  --  >20.00* 7.96* 4.58*  PROCALCITON <0.10  --  0.10 <0.10 0.11 0.14  LATICACIDVEN 1.3  --   --   --   --   --   HGBA1C  --   --  5.6  --   --   --   BNP  --   --  31.3 46.7 48.9 50.9     ------------------------------------------------------------------------------------------------------------------ No results for input(s): CHOL, HDL, LDLCALC, TRIG, CHOLHDL, LDLDIRECT in the last 72 hours.  Lab Results  Component Value Date   HGBA1C 5.6 11/01/2019   ------------------------------------------------------------------------------------------------------------------ No results for input(s): TSH, T4TOTAL, T3FREE, THYROIDAB in the last 72 hours.  Invalid input(s): FREET3  Cardiac Enzymes No results for input(s): CKMB, TROPONINI, MYOGLOBIN in the last 168 hours.  Invalid input(s): CK ------------------------------------------------------------------------------------------------------------------    Component Value Date/Time   BNP 50.9 11/04/2019 0445    Micro Results Recent Results (from the past 240 hour(s))  SARS CORONAVIRUS 2 (TAT 6-24 HRS) Nasopharyngeal Nasopharyngeal Swab     Status: Abnormal   Collection Time: 10/29/19  2:46 PM   Specimen: Nasopharyngeal Swab  Result Value Ref Range Status   SARS Coronavirus 2 POSITIVE (A) NEGATIVE Final    Comment: RESULT CALLED TO, READ BACK BY AND VERIFIED WITH: EMAILED MELISSA BROWNING 0127 10/30/2019 T. TYSOR (NOTE) SARS-CoV-2 target nucleic acids are DETECTED.  The SARS-CoV-2 RNA is generally detectable in upper and lower respiratory specimens during the acute phase of infection. Positive results are indicative of the presence of SARS-CoV-2 RNA. Clinical correlation with patient history and other diagnostic information is  necessary to determine patient infection status. Positive results do not rule out bacterial infection or co-infection with other viruses.  The expected result is Negative.  Fact Sheet for Patients: HairSlick.no  Fact Sheet for Healthcare Providers: quierodirigir.com  This test is not yet approved or cleared by the [place] FDA  and  has been authorized for detection and/or diagnosis of SARS-CoV-2 by FDA under an Emergency Use Authorization (EUA). This EUA will remain  in effect (meaning this tes t can be used) for the duration of the COVID-19 declaration under Section 564(b)(1) of the Act, 21 U.S.C. section 360bbb-3(b)(1), unless the authorization is terminated or revoked sooner.   Performed at [facility], [address], [place], [place] [address]   Blood Culture (routine x 2)     Status: None (Preliminary result)   Collection Time: 10/30/19  7:00 PM   Specimen: BLOOD RIGHT HAND  Result Value Ref Range Status   Specimen Description   Final    BLOOD RIGHT HAND Performed at [facility], [address] [place], [place] [address]    Special Requests   Final    BOTTLES DRAWN AEROBIC AND ANAEROBIC Blood Culture adequate volume Performed at [facility], [address] [place], [place] [address]    Culture   Final    NO GROWTH 4 DAYS Performed at [facility], [address], [place], [place] [address]    Report Status PENDING  Incomplete  Blood Culture (routine x 2)     Status: None (Preliminary result)   Collection Time: 10/30/19  7:20 PM   Specimen: BLOOD  Result Value Ref Range Status   Specimen Description   Final    BLOOD RIGHT  ANTECUBITAL Performed at [facility], [address] [place], [place] [address]    Special Requests   Final    BOTTLES DRAWN AEROBIC AND ANAEROBIC Blood Culture results may not be optimal due to an inadequate volume of blood received in culture bottles Performed at [facility], [address] [place], [place] [address]    Culture   Final    NO GROWTH 4 DAYS Performed at [facility], [address], [place], [place] [address]    Report Status PENDING  Incomplete    Radiology Reports CT Angio Chest PE W and/or Wo Contrast  Result Date: 10/30/2019 CLINICAL DATA:  COVID-19 positive, worsening shortness  of breath at home, O2 sat 77% on room air EXAM: CT ANGIOGRAPHY CHEST WITH CONTRAST TECHNIQUE: Multidetector CT imaging of the chest was performed using the standard protocol during bolus administration of intravenous contrast. Multiplanar CT image reconstructions and MIPs were obtained to evaluate the vascular anatomy. CONTRAST:  OMNIPAQUE IOHEXOL 350 MG/ML SOLN COMPARISON:  Radiograph 10/30/2019 FINDINGS: Cardiovascular: Satisfactory opacification the pulmonary arteries to the segmental level. No pulmonary artery filling defects are identified. Central pulmonary arteries are normal caliber. The aortic root is suboptimally assessed given cardiac pulsation artifact. The aorta is normal caliber. No acute luminal abnormality of the imaged aorta. No periaortic stranding or hemorrhage. Normal 3 vessel branching of the aortic arch. Proximal great vessels are unremarkable. Normal heart size. No pericardial effusion. No major venous abnormalities. Mediastinum/Nodes: No mediastinal fluid or gas. Normal thyroid gland and thoracic inlet. No acute abnormality of the trachea or esophagus. Scattered borderline enlarged nodes with preserved nodal architecture within the mediastinum and hila for instance a 12 mm left paratracheal node (4/37), 10 mm right paratracheal node (4/37), and an 11 mm right hilar node (4/51) are favored to be reactive. No worrisome enlarged mediastinal, hilar or axillary adenopathy. Lungs/Pleura: Multifocal areas of mixed consolidative and ground-glass opacity with interstitial/septal thickening in a crazy paving pattern which is an often reported imaging feature COVID 19 among other infectious and inflammatory etiologies. No pneumothorax or effusion. Mild diffuse airways thickening. Upper Abdomen: No acute abnormalities present in the visualized portions of the upper abdomen. Musculoskeletal: No acute osseous abnormality or suspicious osseous lesion. Multilevel degenerative changes are present in the  imaged portions of the spine. No worrisome chest wall lesions. Review of the MIP images confirms the above findings. IMPRESSION: 1. No evidence of pulmonary embolism. 2. Multifocal areas of mixed consolidative and ground-glass opacity with interstitial/septal thickening compatible with reported imaging features of COVID 19. 3. Scattered borderline enlarged nodes within the mediastinum and hila, favored to be reactive. Electronically Signed   By: Kreg Shropshire M.D.   On: 10/30/2019 20:59   DG Chest Port 1 View  Result Date: 11/01/2019 CLINICAL DATA:  COVID-19 positive with airspace opacity EXAM: PORTABLE CHEST 1 VIEW COMPARISON:  Chest radiograph and chest CT October 30, 2019 FINDINGS: Airspace opacity is again noted throughout much of the right lung with greatest concentration of opacity in the right mid and lower lung regions. Subtle opacity noted in the left mid and lower lung regions. Stable cardiac enlargement. Pulmonary vascularity normal. No adenopathy. No bone lesions. IMPRESSION: Stable multifocal airspace opacity, consistent with known atypical organism pneumonia. No new areas of opacity evident. Stable cardiac prominence. No adenopathy appreciable. Electronically Signed   By: Bretta Bang III M.D.   On: 11/01/2019 08:26   DG Chest Port 1 View  Result Date:  10/30/2019 CLINICAL DATA:  Shortness of breath.  COVID. EXAM: PORTABLE CHEST 1 VIEW COMPARISON:  10/15/2016 FINDINGS: Multifocal airspace opacities throughout the right greater than left lungs. No visible pleural effusions. No pneumothorax. Cardiac silhouette is accentuated by low lung volumes and portable technique. No acute osseous abnormality. IMPRESSION: Multifocal right greater than left airspace opacities, compatible with multifocal pneumonia and reported history of COVID. Electronically Signed   By: Feliberto Harts MD   On: 10/30/2019 19:36   VAS [place] LOWER EXTREMITY VENOUS (DVT)  Result Date: 11/03/2019  Lower Venous DVTStudy  Indications: D-dimer.  Anticoagulation: Lovenox. Comparison Study: No prior studies. Performing Technologist: Jean Rosenthal  Examination Guidelines: A complete evaluation includes B-mode imaging, spectral Doppler, color Doppler, and power Doppler as needed of all accessible portions of each vessel. Bilateral testing is considered an integral part of a complete examination. Limited examinations for reoccurring indications may be performed as noted. The reflux portion of the exam is performed with the patient in reverse Trendelenburg.  +---------+---------------+---------+-----------+----------+--------------+  RIGHT     Compressibility Phasicity Spontaneity Properties Thrombus Aging  +---------+---------------+---------+-----------+----------+--------------+  CFV       Full            Yes       Yes                                    +---------+---------------+---------+-----------+----------+--------------+  SFJ       Full                                                             +---------+---------------+---------+-----------+----------+--------------+  FV Prox   Full                                                             +---------+---------------+---------+-----------+----------+--------------+  FV Mid    Full                                                             +---------+---------------+---------+-----------+----------+--------------+  FV Distal Full                                                             +---------+---------------+---------+-----------+----------+--------------+  PFV       Full                                                             +---------+---------------+---------+-----------+----------+--------------+  POP       Full  Yes       Yes                                    +---------+---------------+---------+-----------+----------+--------------+  PTV       Full                                                              +---------+---------------+---------+-----------+----------+--------------+  PERO      Full                                                             +---------+---------------+---------+-----------+----------+--------------+   +---------+---------------+---------+-----------+----------+--------------+  LEFT      Compressibility Phasicity Spontaneity Properties Thrombus Aging  +---------+---------------+---------+-----------+----------+--------------+  CFV       Full            Yes       Yes                                    +---------+---------------+---------+-----------+----------+--------------+  SFJ       Full                                                             +---------+---------------+---------+-----------+----------+--------------+  FV Prox   Full                                                             +---------+---------------+---------+-----------+----------+--------------+  FV Mid    Full                                                             +---------+---------------+---------+-----------+----------+--------------+  FV Distal Full                                                             +---------+---------------+---------+-----------+----------+--------------+  PFV       Full                                                             +---------+---------------+---------+-----------+----------+--------------+  POP       Full            Yes       Yes                                    +---------+---------------+---------+-----------+----------+--------------+  PTV       Full                                                             +---------+---------------+---------+-----------+----------+--------------+  PERO      Full                                                             +---------+---------------+---------+-----------+----------+--------------+     Summary: RIGHT: - There is no evidence of deep vein thrombosis in the lower extremity.  - No cystic structure found in  the popliteal fossa.  LEFT: - There is no evidence of deep vein thrombosis in the lower extremity.  - No cystic structure found in the popliteal fossa.  *See table(s) above for measurements and observations. Electronically signed by Coral Else MD on 11/03/2019 at 7:41:49 PM.    Final

## 2019-11-04 NOTE — Progress Notes (Signed)
Physical Therapy Treatment Patient Details Name: Brandon Snow MRN: [id number] DOB: [birth year] Today's Date: 11/04/2019    History of Present Illness 59yo male c/o SOB and cough. Hypoxic to 77% in the ED on RA. PE negative. Covid positive. Unvaccinated.  PMH HTN    PT Comments    Pt received in recliner. Spo2 96% on 4L. O2 increased to 6L for mobility. He required min guard assist transfers and ambulation 10' x 4 with RW. Desat to 83% during amb requiring seated rest break between each 10' gait trial. 3-4 minute recovery time to return to 90%. Max RR 33. Max HR 130. Pt in recliner at end of session and returned to 4L. SpO2 93%. HR 110.    Follow Up Recommendations  Home health PT     Equipment Recommendations  Rolling walker with 5" wheels;3in1 (PT)    Recommendations for Other Services       Precautions / Restrictions Precautions Precautions: Other (comment) Precaution Comments: watch sats, Covid +    Mobility  Bed Mobility               General bed mobility comments: pt sitting up in recliner   Transfers Overall transfer level: Needs assistance Equipment used: Rolling walker (2 wheeled) Transfers: Sit to/from Stand Sit to Stand: Min guard         General transfer comment: min guard for safety, increased time  Ambulation/Gait Ambulation/Gait assistance: Min guard Gait Distance (Feet): 10 Feet (x 4) Assistive device: Rolling walker (2 wheeled) Gait Pattern/deviations: Step-through pattern;Decreased stride length Gait velocity: very slow Gait velocity interpretation: <1.8 ft/sec, indicate of risk for recurrent falls General Gait Details: SpO2 96% at rest on 4L. Amb on 6L with desat to 83%. Seated rest break required between each 10' gait trial. 3-4 minute recovery time to 90%. LOB x 1 with pt able to self correct.   Stairs             Wheelchair Mobility    Modified Rankin (Stroke Patients Only)       Balance Overall balance assessment:  Needs assistance Sitting-balance support: Feet supported;No upper extremity supported Sitting balance-Leahy Scale: Good     Standing balance support: Bilateral upper extremity supported;During functional activity Standing balance-Leahy Scale: Poor Standing balance comment: reliant on external support                            Cognition Arousal/Alertness: Awake/alert Behavior During Therapy: WFL for tasks assessed/performed Overall Cognitive Status: Within Functional Limits for tasks assessed                                        Exercises      General Comments General comments (skin integrity, edema, etc.): SpO2 96% at rest on 4L. Amb on 6L desat to 83%. Max RR 33. Max HR 130. Pt returned to 4L in recliner at end of session with SpO2 93%.      Pertinent Vitals/Pain Pain Assessment: No/denies pain    Home Living                      Prior Function            PT Goals (current goals can now be found in the care plan section) Acute Rehab PT Goals Patient Stated Goal: home Progress towards PT goals:  Progressing toward goals    Frequency    Min 3X/week      PT Plan Current plan remains appropriate    Co-evaluation              AM-PAC PT "6 Clicks" Mobility   Outcome Measure  Help needed turning from your back to your side while in a flat bed without using bedrails?: A Little Help needed moving from lying on your back to sitting on the side of a flat bed without using bedrails?: A Little Help needed moving to and from a bed to a chair (including a wheelchair)?: A Little Help needed standing up from a chair using your arms (e.g., wheelchair or bedside chair)?: A Little Help needed to walk in hospital room?: A Little Help needed climbing 3-5 steps with a railing? : A Lot 6 Click Score: 17    End of Session Equipment Utilized During Treatment: Oxygen;Gait belt Activity Tolerance: Patient tolerated treatment  well Patient left: in chair;with call bell/phone within reach Nurse Communication: Mobility status PT Visit Diagnosis: Unsteadiness on feet (R26.81);Difficulty in walking, not elsewhere classified (R26.2);Muscle weakness (generalized) (M62.81)     Time: 7494-4967 PT Time Calculation (min) (ACUTE ONLY): 26 min  Charges:  $Gait Training: 23-37 mins                     Aida Raider, [place]  Office # [phone] Pager [phone]    Ilda Foil 11/04/2019, 1:13 PM

## 2019-11-04 NOTE — TOC Transition Note (Signed)
Transition of Care [facility]) - CM/SW Discharge Note   Patient Details  Name: NASSIR NEIDERT MRN: [id number] Date of Birth: [birth year]  Transition of Care [facility] CM/SW Contact:  Nance Pear, RN Phone Number: 11/04/2019, 3:10 PM   Clinical Narrative:    Case manager noted patient is uninsured and may be in need of assistance.  Case manager made appointment for COVID clinic and them to make appointment for [facility].  COVID clinic appointment is 11/18/19 at 1100.  Will continue to follow for additional needs.   Final next level of care: Home/Self Care Barriers to Discharge: Continued Medical Work up   Patient Goals and CMS Choice        Discharge Placement                       Discharge Plan and Services   Discharge Planning Services: CM Consult                                 Social Determinants of Health (SDOH) Interventions     Readmission Risk Interventions No flowsheet data found.

## 2019-11-05 ENCOUNTER — Inpatient Hospital Stay (HOSPITAL_COMMUNITY): Payer: HRSA Program

## 2019-11-05 DIAGNOSIS — J1282 Pneumonia due to coronavirus disease 2019: Secondary | ICD-10-CM | POA: Diagnosis not present

## 2019-11-05 DIAGNOSIS — U071 COVID-19: Secondary | ICD-10-CM | POA: Diagnosis not present

## 2019-11-05 LAB — CBC WITH DIFFERENTIAL/PLATELET
Abs Immature Granulocytes: 0.14 10*3/uL — ABNORMAL HIGH (ref 0.00–0.07)
Basophils Absolute: 0 10*3/uL (ref 0.0–0.1)
Basophils Relative: 0 %
Eosinophils Absolute: 0 10*3/uL (ref 0.0–0.5)
Eosinophils Relative: 0 %
HCT: 44.4 % (ref 39.0–52.0)
Hemoglobin: 15 g/dL (ref 13.0–17.0)
Immature Granulocytes: 1 %
Lymphocytes Relative: 11 %
Lymphs Abs: 1.4 10*3/uL (ref 0.7–4.0)
MCH: 32.3 pg (ref 26.0–34.0)
MCHC: 33.8 g/dL (ref 30.0–36.0)
MCV: 95.7 fL (ref 80.0–100.0)
Monocytes Absolute: 1 10*3/uL (ref 0.1–1.0)
Monocytes Relative: 8 %
Neutro Abs: 10.5 10*3/uL — ABNORMAL HIGH (ref 1.7–7.7)
Neutrophils Relative %: 80 %
Platelets: 410 10*3/uL — ABNORMAL HIGH (ref 150–400)
RBC: 4.64 MIL/uL (ref 4.22–5.81)
RDW: 11.9 % (ref 11.5–15.5)
WBC: 13.1 10*3/uL — ABNORMAL HIGH (ref 4.0–10.5)
nRBC: 0.2 % (ref 0.0–0.2)

## 2019-11-05 LAB — COMPREHENSIVE METABOLIC PANEL
ALT: 62 U/L — ABNORMAL HIGH (ref 0–44)
AST: 46 U/L — ABNORMAL HIGH (ref 15–41)
Albumin: 2.9 g/dL — ABNORMAL LOW (ref 3.5–5.0)
Alkaline Phosphatase: 126 U/L (ref 38–126)
Anion gap: 14 (ref 5–15)
BUN: 18 mg/dL (ref 6–20)
CO2: 22 mmol/L (ref 22–32)
Calcium: 8.9 mg/dL (ref 8.9–10.3)
Chloride: 97 mmol/L — ABNORMAL LOW (ref 98–111)
Creatinine, Ser: 0.99 mg/dL (ref 0.61–1.24)
GFR calc Af Amer: >60 mL/min (ref >60)
GFR calc non Af Amer: >60 mL/min (ref >60)
Glucose, Bld: 141 mg/dL — ABNORMAL HIGH (ref 70–99)
Potassium: 4 mmol/L (ref 3.5–5.1)
Sodium: 133 mmol/L — ABNORMAL LOW (ref 135–145)
Total Bilirubin: 1.1 mg/dL (ref 0.3–1.2)
Total Protein: 7.9 g/dL (ref 6.5–8.1)

## 2019-11-05 LAB — GLUCOSE, CAPILLARY
Glucose-Capillary: 127 mg/dL — ABNORMAL HIGH (ref 70–99)
Glucose-Capillary: 150 mg/dL — ABNORMAL HIGH (ref 70–99)
Glucose-Capillary: 145 mg/dL — ABNORMAL HIGH (ref 70–99)
Glucose-Capillary: 164 mg/dL — ABNORMAL HIGH (ref 70–99)

## 2019-11-05 LAB — MAGNESIUM: Magnesium: 2.1 mg/dL (ref 1.7–2.4)

## 2019-11-05 LAB — PROCALCITONIN: Procalcitonin: 0.24 ng/mL

## 2019-11-05 LAB — TSH: TSH: 1.268 u[IU]/mL (ref 0.350–4.500)

## 2019-11-05 LAB — D-DIMER, QUANTITATIVE: D-Dimer, Quant: 3.54 ug/mL-FEU — ABNORMAL HIGH (ref 0.00–0.50)

## 2019-11-05 LAB — C-REACTIVE PROTEIN: CRP: 11.8 mg/dL — ABNORMAL HIGH (ref <1.0)

## 2019-11-05 LAB — BRAIN NATRIURETIC PEPTIDE: B Natriuretic Peptide: 48.3 pg/mL (ref 0.0–100.0)

## 2019-11-05 MED ORDER — FUROSEMIDE 10 MG/ML IJ SOLN
40.0000 mg | Freq: Once | INTRAMUSCULAR | Status: AC
  Administered 2019-11-05: 40 mg via INTRAVENOUS
  Filled 2019-11-05: qty 4

## 2019-11-05 MED ORDER — PANTOPRAZOLE SODIUM 40 MG PO TBEC
40.0000 mg | DELAYED_RELEASE_TABLET | Freq: Every day | ORAL | Status: DC
  Administered 2019-11-05 – 2019-11-14 (×10): 40 mg via ORAL
  Filled 2019-11-05 (×10): qty 1

## 2019-11-05 MED ORDER — METOPROLOL TARTRATE 50 MG PO TABS
50.0000 mg | ORAL_TABLET | Freq: Two times a day (BID) | ORAL | Status: DC
  Administered 2019-11-05 – 2019-11-14 (×18): 50 mg via ORAL
  Filled 2019-11-05 (×18): qty 1

## 2019-11-05 MED ORDER — ENOXAPARIN SODIUM 60 MG/0.6ML ~~LOC~~ SOLN
60.0000 mg | Freq: Two times a day (BID) | SUBCUTANEOUS | Status: DC
  Administered 2019-11-06 – 2019-11-13 (×15): 60 mg via SUBCUTANEOUS
  Filled 2019-11-05 (×15): qty 0.6

## 2019-11-05 MED ORDER — OXYMETAZOLINE HCL 0.05 % NA SOLN
1.0000 | Freq: Two times a day (BID) | NASAL | Status: DC
  Administered 2019-11-05 – 2019-11-11 (×12): 1 via NASAL
  Filled 2019-11-05: qty 30

## 2019-11-05 NOTE — Progress Notes (Addendum)
PROGRESS NOTE                                                                                                                                                                                                             Patient Demographics:    Brandon Snow, is a 59 y.o. male, DOB - [birth year], ZOX:[id number]  Outpatient Primary MD for the patient is Patient, No Pcp Per    LOS - 5  Admit date - 10/30/2019    Chief Complaint  Patient presents with  . Cough  . Shortness of Breath       Brief Narrative -  Brandon Snow is a 59 y.o. male with medical history significant of hypertension presented to the ED on 9/29 via EMS for evaluation of shortness of breath and cough.  Oxygen saturation 77% on room air, there he was diagnosed with acute hypoxic respiratory failure due to COVID-19 pneumonia and ARDS.  He was admitted to the hospital and placed on heated high flow.   Subjective:   Patient in bed, appears comfortable, denies any headache, no fever, no chest pain or pressure, although he looks to be short winded but he denies any shortness of breath , no abdominal pain. No focal weakness.   Assessment  & Plan :     1. Acute Hypoxic Resp. Failure due to Acute Covid 19 Viral Pneumonitis during the ongoing 2020 Covid 19 Pandemic - he is unfortunately unvaccinated and has incurred severe parenchymal lung injury with ARDS due to COVID-19 pneumonia, has been started on high-dose IV Steroids, Remdesivir and Baricitinib.  He had initially shown good improvement however started worsening evening of November 04, 2019 and now requiring 30 L heated high flow, continue to monitor closely, already on maximal treatment. Trial of IV Lasix on November 05, 2019.  Encouraged the patient to sit up in chair in the daytime use I-S and flutter valve for pulmonary toiletry and then prone in bed when at night.  Will advance activity and titrate down oxygen  as possible.    SpO2: (!) 87 % O2 Flow Rate (L/min): 30 L/min FiO2 (%): 100 %   Recent Labs  Lab 10/29/19 1446 10/30/19 1918 10/30/19 1918 11/01/19 0611 11/01/19 0934 11/02/19 0500 11/03/19 1027 11/04/19 0445 11/05/19 0306  WBC  --  6.1   < > 4.5  --  10.1 11.9* 11.6* 13.1*  PLT  --  159   < > 220  --  235 281 302 410*  CRP  --  10.4*   < > 10.4*  --  7.2* 5.7* 9.1* 11.8*  BNP  --   --   --   --  31.3 46.7 48.9 50.9 48.3  DDIMER  --  3.92*   < > 6.53*  --  >20.00* 7.96* 4.58* 3.54*  PROCALCITON  --  <0.10   < >  --  0.10 <0.10 0.11 0.14 0.24  AST  --  101*   < > 97*  --  79* 58* 48* 46*  ALT  --  45*   < > 50*  --  52* 60* 60* 62*  ALKPHOS  --  95   < > 120  --  120 121 125 126  BILITOT  --  1.1   < > 1.1  --  1.4* 1.3* 1.0 1.1  ALBUMIN  --  3.3*   < > 3.0*  --  3.0* 2.9* 2.8* 2.9*  LATICACIDVEN  --  1.3  --   --   --   --   --   --   --   SARSCOV2NAA POSITIVE*  --   --   --   --   --   --   --   --    < > = values in this interval not displayed.     2.  Asymptomatic transaminitis due to viral infection.  Monitor.  3.  Smoking.  Counseled to quit.  4.  Stress-induced hypertension.  On as needed labetalol.  Monitor.  5.  Risisng D-dimer due to inflammation.  CTA negative, -ve leg ultrasound.  Was on full dose Lovenox, since he is developing intermittent nosebleed will transition to low-dose twice a day from November 06, 2019, skip the evening dose on November 05, 2019..   6.  Mild epistaxis in the morning.  Completely resolved, nasal saline spray and Afrin ordered, see #5 above.      Condition - Extremely Guarded  Family Communication  : [name] - [phone] on 11/01/19, 11/03/19, November 04, 2019, November 05, 2019  Code Status :  Full  Consults  :  None  Procedures  :    CTA - no PE  Leg [place] - No DVT   PUD Prophylaxis : PPI  Disposition Plan  :    Status is: Inpatient  Remains inpatient appropriate because:IV treatments appropriate due to  intensity of illness or inability to take PO   Dispo: The patient is from: Home              Anticipated d/c is to: Home              Anticipated d/c date is: > 3 days              Patient currently is not medically stable to d/c.  DVT Prophylaxis  :  Lovenox   Lab Results  Component Value Date   PLT 410 (H) 11/05/2019    Diet :  Diet Order            Diet Heart Room service appropriate? Yes; Fluid consistency: Thin  Diet effective now                  Inpatient Medications  Scheduled Meds: . vitamin C  500 mg Oral Daily  . baricitinib  4 mg Oral Daily  .  cholecalciferol  1,000 Units Oral Daily  . [START ON 11/06/2019] enoxaparin (LOVENOX) injection  60 mg Subcutaneous Q12H  . influenza vac split quadrivalent PF  0.5 mL Intramuscular Tomorrow-1000  . insulin aspart  0-15 Units Subcutaneous TID WC  . insulin aspart  0-5 Units Subcutaneous QHS  . methylPREDNISolone (SOLU-MEDROL) injection  60 mg Intravenous Daily  . metoprolol tartrate  50 mg Oral BID  . nicotine  14 mg Transdermal Daily  . oxymetazoline  1 spray Each Nare BID  . pantoprazole  40 mg Oral Daily  . pneumococcal 23 valent vaccine  0.5 mL Intramuscular Tomorrow-1000  . zinc sulfate  220 mg Oral Daily   Continuous Infusions: . sodium chloride Stopped (10/31/19 1114)   PRN Meds:.sodium chloride, acetaminophen, albuterol, chlorpheniramine-HYDROcodone, guaiFENesin-dextromethorphan, labetalol, lip balm, sodium chloride  Antibiotics  :    Anti-infectives (From admission, onward)   Start     Dose/Rate Route Frequency Ordered Stop   10/31/19 1000  remdesivir 100 mg in sodium chloride 0.9 % 100 mL IVPB        100 mg 200 mL/hr over 30 Minutes Intravenous Daily 10/30/19 1851 11/03/19 1124   10/30/19 1930  remdesivir 100 mg in sodium chloride 0.9 % 100 mL IVPB       "Followed by" Linked Group Details   100 mg 200 mL/hr over 30 Minutes Intravenous  Once 10/30/19 1851 10/30/19 2106   10/30/19 1900  remdesivir  100 mg in sodium chloride 0.9 % 100 mL IVPB       "Followed by" Linked Group Details   100 mg 200 mL/hr over 30 Minutes Intravenous  Once 10/30/19 1851 10/30/19 2106       Time Spent in minutes  30   Susa Raring M.D on 11/05/2019 at 11:19 AM  To page go to [email or web address] - password [facility]  [facility] -  Office  [phone]     See all Orders from today for further details    Objective:   Vitals:   11/05/19 0204 11/05/19 0400 11/05/19 0754 11/05/19 0839  BP: 134/67 117/70 108/76   Pulse: 100 (!) 112 (!) 122   Resp: 20 (!) 25 20   Temp: 97.6 F (36.4 C) 97.6 F (36.4 C) 98.3 F (36.8 C)   TempSrc: Axillary Oral Oral   SpO2: 91%  90% (!) 87%  Weight:      Height:        Wt Readings from Last 3 Encounters:  10/31/19 130.5 kg  12/22/18 124.7 kg  10/15/16 124.7 kg     Intake/Output Summary (Last 24 hours) at 11/05/2019 1119 Last data filed at 11/05/2019 0611 Gross per 24 hour  Intake --  Output 2025 ml  Net -2025 ml     Physical Exam  Awake Alert, No new F.N deficits, Normal affect [place].AT,PERRAL Supple Neck,No JVD, No cervical lymphadenopathy appriciated.  Symmetrical Chest wall movement, Good air movement bilaterally, few rales RRR,No Gallops, Rubs or new Murmurs, No Parasternal Heave +ve B.Sounds, Abd Soft, No tenderness, No organomegaly appriciated, No rebound - guarding or rigidity. No Cyanosis, Clubbing or edema, No new Rash or bruise     Data Review:    CBC Recent Labs  Lab 11/01/19 0611 11/02/19 0500 11/03/19 1027 11/04/19 0445 11/05/19 0306  WBC 4.5 10.1 11.9* 11.6* 13.1*  HGB 15.2 15.1 14.3 14.5 15.0  HCT 46.6 45.1 43.4 43.0 44.4  PLT 220 235 281 302 410*  MCV 97.1 96.8 95.8 96.2 95.7  MCH 31.7 32.4 31.6 32.4  32.3  MCHC 32.6 33.5 32.9 33.7 33.8  RDW 11.8 11.7 11.7 11.8 11.9  LYMPHSABS 1.0 1.3 1.7 1.1 1.4  MONOABS 0.3 0.7 0.9 0.9 1.0  EOSABS 0.0 0.0 0.0 0.0 0.0  BASOSABS 0.0 0.0 0.0 0.0 0.0    Recent Labs  Lab  10/30/19 1918 10/30/19 1918 11/01/19 0611 11/01/19 0934 11/02/19 0500 11/03/19 1027 11/04/19 0445 11/05/19 0306  NA 134*   < > 140  --  136 137 135 133*  K 4.0   < > 5.1  --  4.3 4.4 4.5 4.0  CL 98   < > 99  --  97* 99 99 97*  CO2 25   < > 28  --  26 27 26 22   GLUCOSE 114*   < > 172*  --  157* 128* 157* 141*  BUN 9   < > 12  --  13 15 16 18   CREATININE 0.98   < > 1.09  --  1.02 1.02 1.04 0.99  CALCIUM 8.2*   < > 8.9  --  8.8* 8.9 8.8* 8.9  AST 101*   < > 97*  --  79* 58* 48* 46*  ALT 45*   < > 50*  --  52* 60* 60* 62*  ALKPHOS 95   < > 120  --  120 121 125 126  BILITOT 1.1   < > 1.1  --  1.4* 1.3* 1.0 1.1  ALBUMIN 3.3*   < > 3.0*  --  3.0* 2.9* 2.8* 2.9*  MG  --   --   --  2.3 2.1 2.2 2.2 2.1  CRP 10.4*   < > 10.4*  --  7.2* 5.7* 9.1* 11.8*  DDIMER 3.92*   < > 6.53*  --  >20.00* 7.96* 4.58* 3.54*  PROCALCITON <0.10   < >  --  0.10 <0.10 0.11 0.14 0.24  LATICACIDVEN 1.3  --   --   --   --   --   --   --   HGBA1C  --   --   --  5.6  --   --   --   --   BNP  --   --   --  31.3 46.7 48.9 50.9 48.3   < > = values in this interval not displayed.    ------------------------------------------------------------------------------------------------------------------ No results for input(s): CHOL, HDL, LDLCALC, TRIG, CHOLHDL, LDLDIRECT in the last 72 hours.  Lab Results  Component Value Date   HGBA1C 5.6 11/01/2019   ------------------------------------------------------------------------------------------------------------------ No results for input(s): TSH, T4TOTAL, T3FREE, THYROIDAB in the last 72 hours.  Invalid input(s): FREET3  Cardiac Enzymes No results for input(s): CKMB, TROPONINI, MYOGLOBIN in the last 168 hours.  Invalid input(s): CK ------------------------------------------------------------------------------------------------------------------    Component Value Date/Time   BNP 48.3 11/05/2019 0306    Micro Results Recent Results (from the past 240 hour(s))   SARS CORONAVIRUS 2 (TAT 6-24 HRS) Nasopharyngeal Nasopharyngeal Swab     Status: Abnormal   Collection Time: 10/29/19  2:46 PM   Specimen: Nasopharyngeal Swab  Result Value Ref Range Status   SARS Coronavirus 2 POSITIVE (A) NEGATIVE Final    Comment: RESULT CALLED TO, READ BACK BY AND VERIFIED WITH: EMAILED MELISSA BROWNING 0127 10/30/2019 T. TYSOR (NOTE) SARS-CoV-2 target nucleic acids are DETECTED.  The SARS-CoV-2 RNA is generally detectable in upper and lower respiratory specimens during the acute phase of infection. Positive results are indicative of the presence of SARS-CoV-2 RNA. Clinical correlation with patient history and  other diagnostic information is  necessary to determine patient infection status. Positive results do not rule out bacterial infection or co-infection with other viruses.  The expected result is Negative.  Fact Sheet for Patients: HairSlick.no  Fact Sheet for Healthcare Providers: quierodirigir.com  This test is not yet approved or cleared by the [place] FDA and  has been authorized for detection and/or diagnosis of SARS-CoV-2 by FDA under an Emergency Use Authorization (EUA). This EUA will remain  in effect (meaning this tes t can be used) for the duration of the COVID-19 declaration under Section 564(b)(1) of the Act, 21 U.S.C. section 360bbb-3(b)(1), unless the authorization is terminated or revoked sooner.   Performed at [facility], [address], [place], [place] [address]   Blood Culture (routine x 2)     Status: None   Collection Time: 10/30/19  7:00 PM   Specimen: BLOOD RIGHT HAND  Result Value Ref Range Status   Specimen Description   Final    BLOOD RIGHT HAND Performed at [facility], [address] [place], [place] [address]    Special Requests   Final    BOTTLES DRAWN AEROBIC AND ANAEROBIC Blood Culture adequate volume Performed at [facility], [address] [place], [place] [address]    Culture   Final    NO GROWTH 5 DAYS Performed at [facility], [address], [place], [place] [address]    Report Status 11/04/2019 FINAL  Final  Blood Culture (routine x 2)     Status: None   Collection Time: 10/30/19  7:20 PM   Specimen: BLOOD  Result Value Ref Range Status   Specimen Description   Final    BLOOD RIGHT ANTECUBITAL Performed at [facility], [address], [place], [place] [address]    Special Requests   Final    BOTTLES DRAWN AEROBIC AND ANAEROBIC Blood Culture results may not be optimal due to an inadequate volume of blood received in culture bottles Performed at [facility], [address] [place], [place] [address]    Culture   Final    NO GROWTH 5 DAYS Performed at [facility], [address] [place], [place] [address]    Report Status 11/04/2019 FINAL  Final    Radiology Reports CT Angio Chest PE W and/or Wo Contrast  Result Date: 10/30/2019 CLINICAL DATA:  COVID-19 positive, worsening shortness of breath at home, O2 sat 77% on room air EXAM: CT ANGIOGRAPHY CHEST WITH CONTRAST TECHNIQUE: Multidetector CT imaging of the chest was performed using the standard protocol during bolus administration of intravenous contrast. Multiplanar CT image reconstructions and MIPs were obtained to evaluate the vascular anatomy. CONTRAST:  OMNIPAQUE IOHEXOL 350 MG/ML SOLN COMPARISON:  Radiograph 10/30/2019 FINDINGS: Cardiovascular: Satisfactory opacification the pulmonary arteries to the segmental level. No pulmonary artery filling defects are identified. Central pulmonary arteries are normal caliber. The aortic root is suboptimally assessed given cardiac pulsation artifact. The aorta is normal caliber. No acute luminal abnormality of the imaged aorta. No periaortic stranding or hemorrhage. Normal 3 vessel branching of the aortic arch. Proximal great vessels are unremarkable. Normal  heart size. No pericardial effusion. No major venous abnormalities. Mediastinum/Nodes: No mediastinal fluid or gas. Normal thyroid gland and thoracic inlet. No acute abnormality of the trachea or esophagus. Scattered borderline enlarged nodes with preserved nodal architecture within the mediastinum and hila for instance a 12 mm left  paratracheal node (4/37), 10 mm right paratracheal node (4/37), and an 11 mm right hilar node (4/51) are favored to be reactive. No worrisome enlarged mediastinal, hilar or axillary adenopathy. Lungs/Pleura: Multifocal areas of mixed consolidative and ground-glass opacity with interstitial/septal thickening in a crazy paving pattern which is an often reported imaging feature COVID 19 among other infectious and inflammatory etiologies. No pneumothorax or effusion. Mild diffuse airways thickening. Upper Abdomen: No acute abnormalities present in the visualized portions of the upper abdomen. Musculoskeletal: No acute osseous abnormality or suspicious osseous lesion. Multilevel degenerative changes are present in the imaged portions of the spine. No worrisome chest wall lesions. Review of the MIP images confirms the above findings. IMPRESSION: 1. No evidence of pulmonary embolism. 2. Multifocal areas of mixed consolidative and ground-glass opacity with interstitial/septal thickening compatible with reported imaging features of COVID 19. 3. Scattered borderline enlarged nodes within the mediastinum and hila, favored to be reactive. Electronically Signed   By: Kreg Shropshire M.D.   On: 10/30/2019 20:59   DG Chest Port 1 View  Result Date: 11/05/2019 CLINICAL DATA:  Shortness of breath, COVID positive EXAM: PORTABLE CHEST 1 VIEW COMPARISON:  Chest radiograph dated 11/01/2019. FINDINGS: The cardiac silhouette is obscured, but appears unchanged. Moderate bilateral airspace opacities appear decreased on the right and unchanged on the left. No large pleural effusion is identified. There is no  pneumothorax. The osseous structures are unremarkable. IMPRESSION: Moderate bilateral airspace opacities, decreased on the right and unchanged on the left. Electronically Signed   By: Romona Curls M.D.   On: 11/05/2019 08:45   DG Chest Port 1 View  Result Date: 11/01/2019 CLINICAL DATA:  COVID-19 positive with airspace opacity EXAM: PORTABLE CHEST 1 VIEW COMPARISON:  Chest radiograph and chest CT October 30, 2019 FINDINGS: Airspace opacity is again noted throughout much of the right lung with greatest concentration of opacity in the right mid and lower lung regions. Subtle opacity noted in the left mid and lower lung regions. Stable cardiac enlargement. Pulmonary vascularity normal. No adenopathy. No bone lesions. IMPRESSION: Stable multifocal airspace opacity, consistent with known atypical organism pneumonia. No new areas of opacity evident. Stable cardiac prominence. No adenopathy appreciable. Electronically Signed   By: Bretta Bang III M.D.   On: 11/01/2019 08:26   DG Chest Port 1 View  Result Date: 10/30/2019 CLINICAL DATA:  Shortness of breath.  COVID. EXAM: PORTABLE CHEST 1 VIEW COMPARISON:  10/15/2016 FINDINGS: Multifocal airspace opacities throughout the right greater than left lungs. No visible pleural effusions. No pneumothorax. Cardiac silhouette is accentuated by low lung volumes and portable technique. No acute osseous abnormality. IMPRESSION: Multifocal right greater than left airspace opacities, compatible with multifocal pneumonia and reported history of COVID. Electronically Signed   By: Feliberto Harts MD   On: 10/30/2019 19:36   VAS [place] LOWER EXTREMITY VENOUS (DVT)  Result Date: 11/03/2019  Lower Venous DVTStudy Indications: D-dimer.  Anticoagulation: Lovenox. Comparison Study: No prior studies. Performing Technologist: Jean Rosenthal  Examination Guidelines: A complete evaluation includes B-mode imaging, spectral Doppler, color Doppler, and power Doppler as needed of all  accessible portions of each vessel. Bilateral testing is considered an integral part of a complete examination. Limited examinations for reoccurring indications may be performed as noted. The reflux portion of the exam is performed with the patient in reverse Trendelenburg.  +---------+---------------+---------+-----------+----------+--------------+ RIGHT    CompressibilityPhasicitySpontaneityPropertiesThrombus Aging +---------+---------------+---------+-----------+----------+--------------+ CFV      Full           Yes  Yes                                 +---------+---------------+---------+-----------+----------+--------------+ SFJ      Full                                                        +---------+---------------+---------+-----------+----------+--------------+ FV Prox  Full                                                        +---------+---------------+---------+-----------+----------+--------------+ FV Mid   Full                                                        +---------+---------------+---------+-----------+----------+--------------+ FV DistalFull                                                        +---------+---------------+---------+-----------+----------+--------------+ PFV      Full                                                        +---------+---------------+---------+-----------+----------+--------------+ POP      Full           Yes      Yes                                 +---------+---------------+---------+-----------+----------+--------------+ PTV      Full                                                        +---------+---------------+---------+-----------+----------+--------------+ PERO     Full                                                        +---------+---------------+---------+-----------+----------+--------------+   +---------+---------------+---------+-----------+----------+--------------+  LEFT     CompressibilityPhasicitySpontaneityPropertiesThrombus Aging +---------+---------------+---------+-----------+----------+--------------+ CFV      Full           Yes      Yes                                 +---------+---------------+---------+-----------+----------+--------------+ SFJ      Full                                                        +---------+---------------+---------+-----------+----------+--------------+  FV Prox  Full                                                        +---------+---------------+---------+-----------+----------+--------------+ FV Mid   Full                                                        +---------+---------------+---------+-----------+----------+--------------+ FV DistalFull                                                        +---------+---------------+---------+-----------+----------+--------------+ PFV      Full                                                        +---------+---------------+---------+-----------+----------+--------------+ POP      Full           Yes      Yes                                 +---------+---------------+---------+-----------+----------+--------------+ PTV      Full                                                        +---------+---------------+---------+-----------+----------+--------------+ PERO     Full                                                        +---------+---------------+---------+-----------+----------+--------------+     Summary: RIGHT: - There is no evidence of deep vein thrombosis in the lower extremity.  - No cystic structure found in the popliteal fossa.  LEFT: - There is no evidence of deep vein thrombosis in the lower extremity.  - No cystic structure found in the popliteal fossa.  *See table(s) above for measurements and observations. Electronically signed by Coral Else MD on 11/03/2019 at 7:41:49 PM.    Final

## 2019-11-05 NOTE — Progress Notes (Signed)
At [phone] pt experienced a nose bleed. Pt was instructed to pinch nose. Once bleeding stopped pt was given PRN nasal spray. Pt on 15 L HFNC and 15 L NRB. Next RN informed.

## 2019-11-05 NOTE — Progress Notes (Signed)
   11/05/19 0006  Vitals  Temp 97.6 F (36.4 C)  Temp Source Oral  BP 125/81  MAP (mmHg) 91  BP Location Left Arm  BP Method Automatic  Patient Position (if appropriate) Sitting  Pulse Rate 94  Pulse Rate Source Monitor  ECG Heart Rate 95  Resp (!) 26  Level of Consciousness  Level of Consciousness Alert  MEWS COLOR  MEWS Score Color Yellow  Oxygen Therapy  SpO2 92 %  O2 Device HFNC;Non-rebreather Mask  O2 Flow Rate (L/min) 15 L/min (15 nrb)  Patient Activity (if Appropriate) In chair  Pulse Oximetry Type Continuous  Pain Assessment  Pain Scale 0-10  Pain Score 0  MEWS Score  MEWS Temp 0  MEWS Systolic 0  MEWS Pulse 0  MEWS RR 2  MEWS LOC 0  MEWS Score 2   Discussed yellow Mews with charge RN. Yellow guidelines initiated. Pt reports no pain, denies SOB, resting in chair. Pt is on 15 L HFNC and 15L NRB mask sine 11-04-19 2000 pt O2 sats dropped to low 80's. While on 15L HFNC and 15L NRB mask pt O2 has stayed between 88%-94% while resting in the chair.

## 2019-11-06 DIAGNOSIS — J9601 Acute respiratory failure with hypoxia: Secondary | ICD-10-CM | POA: Diagnosis not present

## 2019-11-06 DIAGNOSIS — U071 COVID-19: Secondary | ICD-10-CM | POA: Diagnosis not present

## 2019-11-06 DIAGNOSIS — I10 Essential (primary) hypertension: Secondary | ICD-10-CM | POA: Diagnosis not present

## 2019-11-06 DIAGNOSIS — J1282 Pneumonia due to coronavirus disease 2019: Secondary | ICD-10-CM | POA: Diagnosis not present

## 2019-11-06 LAB — CBC WITH DIFFERENTIAL/PLATELET
Abs Immature Granulocytes: 0.13 10*3/uL — ABNORMAL HIGH (ref 0.00–0.07)
Basophils Absolute: 0 10*3/uL (ref 0.0–0.1)
Basophils Relative: 0 %
Eosinophils Absolute: 0.2 10*3/uL (ref 0.0–0.5)
Eosinophils Relative: 2 %
HCT: 44.5 % (ref 39.0–52.0)
Hemoglobin: 15.2 g/dL (ref 13.0–17.0)
Immature Granulocytes: 1 %
Lymphocytes Relative: 11 %
Lymphs Abs: 1.4 10*3/uL (ref 0.7–4.0)
MCH: 32.8 pg (ref 26.0–34.0)
MCHC: 34.2 g/dL (ref 30.0–36.0)
MCV: 95.9 fL (ref 80.0–100.0)
Monocytes Absolute: 1.1 10*3/uL — ABNORMAL HIGH (ref 0.1–1.0)
Monocytes Relative: 9 %
Neutro Abs: 9.5 10*3/uL — ABNORMAL HIGH (ref 1.7–7.7)
Neutrophils Relative %: 77 %
Platelets: 436 10*3/uL — ABNORMAL HIGH (ref 150–400)
RBC: 4.64 MIL/uL (ref 4.22–5.81)
RDW: 12 % (ref 11.5–15.5)
WBC: 12.3 10*3/uL — ABNORMAL HIGH (ref 4.0–10.5)
nRBC: 0 % (ref 0.0–0.2)

## 2019-11-06 LAB — COMPREHENSIVE METABOLIC PANEL
ALT: 56 U/L — ABNORMAL HIGH (ref 0–44)
AST: 38 U/L (ref 15–41)
Albumin: 2.7 g/dL — ABNORMAL LOW (ref 3.5–5.0)
Alkaline Phosphatase: 113 U/L (ref 38–126)
Anion gap: 13 (ref 5–15)
BUN: 21 mg/dL — ABNORMAL HIGH (ref 6–20)
CO2: 25 mmol/L (ref 22–32)
Calcium: 9 mg/dL (ref 8.9–10.3)
Chloride: 92 mmol/L — ABNORMAL LOW (ref 98–111)
Creatinine, Ser: 1.1 mg/dL (ref 0.61–1.24)
GFR calc non Af Amer: >60 mL/min (ref >60)
Glucose, Bld: 134 mg/dL — ABNORMAL HIGH (ref 70–99)
Potassium: 4.1 mmol/L (ref 3.5–5.1)
Sodium: 130 mmol/L — ABNORMAL LOW (ref 135–145)
Total Bilirubin: 1.3 mg/dL — ABNORMAL HIGH (ref 0.3–1.2)
Total Protein: 7.8 g/dL (ref 6.5–8.1)

## 2019-11-06 LAB — GLUCOSE, CAPILLARY
Glucose-Capillary: 133 mg/dL — ABNORMAL HIGH (ref 70–99)
Glucose-Capillary: 194 mg/dL — ABNORMAL HIGH (ref 70–99)
Glucose-Capillary: 211 mg/dL — ABNORMAL HIGH (ref 70–99)
Glucose-Capillary: 126 mg/dL — ABNORMAL HIGH (ref 70–99)

## 2019-11-06 LAB — C-REACTIVE PROTEIN: CRP: 19.8 mg/dL — ABNORMAL HIGH (ref <1.0)

## 2019-11-06 LAB — BRAIN NATRIURETIC PEPTIDE: B Natriuretic Peptide: 32.4 pg/mL (ref 0.0–100.0)

## 2019-11-06 LAB — PROCALCITONIN: Procalcitonin: 0.21 ng/mL

## 2019-11-06 LAB — D-DIMER, QUANTITATIVE: D-Dimer, Quant: 3.39 ug/mL-FEU — ABNORMAL HIGH (ref 0.00–0.50)

## 2019-11-06 LAB — MAGNESIUM: Magnesium: 2.3 mg/dL (ref 1.7–2.4)

## 2019-11-06 MED ORDER — SENNOSIDES-DOCUSATE SODIUM 8.6-50 MG PO TABS
2.0000 | ORAL_TABLET | Freq: Two times a day (BID) | ORAL | Status: DC
  Administered 2019-11-06 – 2019-11-14 (×14): 2 via ORAL
  Filled 2019-11-06 (×16): qty 2

## 2019-11-06 NOTE — Progress Notes (Signed)
Physical Therapy Treatment Patient Details Name: Brandon Snow MRN: [id number] DOB: Feb 04, 1960 Today's Date: 11/06/2019    History of Present Illness 58yo male c/o SOB and cough. Hypoxic to 77% in the ED on RA. PE negative. Covid positive. Unvaccinated.  PMH HTN    PT Comments    Pt with much increased O2 requirements this session vs two days ago, but demonstrates progressed tolerance for ambulation. Pt ambulated 2x30 ft with no AD and no LOB this session, pt with seated rest break to recover DOE 2/4. PT instructed pt in LE exercises to promote circulation and maintain LE strength, and PT encouraged OOB daily with RN assist. PT To continue to follow acutely.   35LO2 via HHFNC, 100% FiO2, SpO2 88-91% during mobility with NRB mask donned.    Follow Up Recommendations  Home health PT     Equipment Recommendations  Rolling walker with 5" wheels;3in1 (PT)    Recommendations for Other Services       Precautions / Restrictions Precautions Precautions: Other (comment) Precaution Comments: watch sats, Covid + Restrictions Weight Bearing Restrictions: No    Mobility  Bed Mobility               General bed mobility comments: pt sitting up in recliner   Transfers Overall transfer level: Needs assistance Equipment used: None Transfers: Sit to/from Stand Sit to Stand: Supervision         General transfer comment: for safety, slow to rise and steady.  Ambulation/Gait Ambulation/Gait assistance: Min guard Gait Distance (Feet): 30 Feet (x2) Assistive device: None Gait Pattern/deviations: Step-through pattern;Decreased stride length Gait velocity: decr   General Gait Details: min guard for safety, pt with no use of AD but occasionally reaching for environment to self-steady. on HHFNC at 35L, 100%. Seated rest break after 30 ft (forward 5 ft, backward 5 ft x3)   Stairs             Wheelchair Mobility    Modified Rankin (Stroke Patients Only)        Balance Overall balance assessment: Needs assistance Sitting-balance support: Feet supported;No upper extremity supported Sitting balance-Leahy Scale: Good     Standing balance support: During functional activity;No upper extremity supported Standing balance-Leahy Scale: Fair                              Cognition Arousal/Alertness: Awake/alert Behavior During Therapy: WFL for tasks assessed/performed Overall Cognitive Status: Within Functional Limits for tasks assessed                                        Exercises General Exercises - Lower Extremity Long Arc Quad: AROM;Both;10 reps;Seated Hip Flexion/Marching: AROM;Both;10 reps;Seated    General Comments General comments (skin integrity, edema, etc.): 35LO2 via HHFNC, 100% FiO2, SpO2 88-91% during mobility with NRB mask donned.      Pertinent Vitals/Pain Pain Assessment: No/denies pain    Home Living                      Prior Function            PT Goals (current goals can now be found in the care plan section) Acute Rehab PT Goals Patient Stated Goal: home PT Goal Formulation: With patient Time For Goal Achievement: 11/15/19 Potential to Achieve Goals: Fair Progress towards PT  goals: Progressing toward goals    Frequency    Min 3X/week      PT Plan Current plan remains appropriate    Co-evaluation              AM-PAC PT "6 Clicks" Mobility   Outcome Measure  Help needed turning from your back to your side while in a flat bed without using bedrails?: A Little Help needed moving from lying on your back to sitting on the side of a flat bed without using bedrails?: A Little Help needed moving to and from a bed to a chair (including a wheelchair)?: A Little Help needed standing up from a chair using your arms (e.g., wheelchair or bedside chair)?: A Little Help needed to walk in hospital room?: A Little Help needed climbing 3-5 steps with a railing? : A Lot 6  Click Score: 17    End of Session Equipment Utilized During Treatment: Oxygen Activity Tolerance: Patient tolerated treatment well Patient left: in chair;with call bell/phone within reach Nurse Communication: Mobility status PT Visit Diagnosis: Unsteadiness on feet (R26.81);Difficulty in walking, not elsewhere classified (R26.2);Muscle weakness (generalized) (M62.81)     Time: 2778-2423 PT Time Calculation (min) (ACUTE ONLY): 14 min  Charges:  $Gait Training: 8-22 mins                     Adalyna Godbee E, PT Acute Rehabilitation Services Pager [phone]  Office [phone]    Seylah Wernert D Despina Hidden 11/06/2019, 5:56 PM

## 2019-11-06 NOTE — Progress Notes (Signed)
PROGRESS NOTE                                                                                                                                                                                                             Patient Demographics:    Brandon Snow, is a 59 y.o. male, DOB - 18-May-1960, ZOX:[id number]  Outpatient Primary MD for the patient is Patient, No Pcp Per    LOS - 6  Admit date - 10/30/2019    Chief Complaint  Patient presents with  . Cough  . Shortness of Breath       Brief Narrative -  Brandon Snow is a 59 y.o. male with medical history significant of hypertension presented to the ED on 9/29 via EMS for evaluation of shortness of breath and cough.  Oxygen saturation 77% on room air, there he was diagnosed with acute hypoxic respiratory failure due to COVID-19 pneumonia and ARDS.  He was admitted to the hospital and placed on heated high flow.   Subjective:   Patient in bed, he still report dyspnea with minimal activity, reports cough, denies any chest pain or fever, no nausea or vomiting .   Assessment  & Plan :    Acute Hypoxic Resp. Failure due to Acute Covid 19 Viral Pneumonitis during the ongoing 2020 Covid 19 Pandemic  - he is unfortunately unvaccinated and has incurred severe parenchymal lung injury with ARDS due to COVID-19 pneumonia,  -Continue with IV steroids . -Treated with IV remdesivir . -Treated with baricitinib.  . -Was encouraged to use incentive spirometry, flutter valve, out of bed to chair . -He is having increased oxygen requirement, this morning he is on 40 L heated high flow nasal cannula and 15 L NRB . -On Lasix as needed, normal BNP, hold on Lasix today.  Encouraged the patient to sit up in chair in the daytime use I-S and flutter valve for pulmonary toiletry and then prone in bed when at night.  Will advance activity and titrate down oxygen as possible.    SpO2: 91 % O2  Flow Rate (L/min): 40 L/min FiO2 (%): 100 %   Recent Labs  Lab 10/30/19 1918 11/01/19 0611 11/02/19 0500 11/03/19 1027 11/04/19 0445 11/05/19 0306 11/06/19 0503 11/06/19 0504  WBC 6.1   < > 10.1 11.9* 11.6* 13.1* 12.3*  --  PLT 159   < > 235 281 302 410* 436*  --   CRP 10.4*   < > 7.2* 5.7* 9.1* 11.8* 19.8*  --   BNP  --    < > 46.7 48.9 50.9 48.3  --  32.4  DDIMER 3.92*   < > >20.00* 7.96* 4.58* 3.54* 3.39*  --   PROCALCITON <0.10   < > <0.10 0.11 0.14 0.24 0.21  --   AST 101*   < > 79* 58* 48* 46* 38  --   ALT 45*   < > 52* 60* 60* 62* 56*  --   ALKPHOS 95   < > 120 121 125 126 113  --   BILITOT 1.1   < > 1.4* 1.3* 1.0 1.1 1.3*  --   ALBUMIN 3.3*   < > 3.0* 2.9* 2.8* 2.9* 2.7*  --   LATICACIDVEN 1.3  --   --   --   --   --   --   --    < > = values in this interval not displayed.     Asymptomatic transaminitis due to viral infection.  Monitor.  Smoking.  Counseled to quit.  Stress-induced hypertension.  On as needed labetalol.  Monitor.  Risisng D-dimer due to inflammation.  CTA negative, -ve leg ultrasound.  Was on full dose Lovenox, since he is developing intermittent nosebleed will transition to low-dose twice a day from November 06, 2019, skip the evening dose on November 05, 2019..   Mild epistaxis .  Completely resolved, nasal saline spray and Afrin ordered, see #5 above.      Condition - Extremely Guarded  Family Communication  : [name] - [phone] on 11/01/19, 11/03/19, November 04, 2019, November 05, 2019  Code Status :  Full  Consults  :  None  Procedures  :    CTA - no PE  Leg [place] - No DVT   PUD Prophylaxis : PPI  Disposition Plan  :    Status is: Inpatient  Remains inpatient appropriate because:Hemodynamically unstable and IV treatments appropriate due to intensity of illness or inability to take PO   Dispo: The patient is from: Home              Anticipated d/c is to: Home              Anticipated d/c date is: > 3 days               Patient currently is not medically stable to d/c.  DVT Prophylaxis  :  Lovenox   Lab Results  Component Value Date   PLT 436 (H) 11/06/2019    Diet :  Diet Order            Diet Heart Room service appropriate? Yes; Fluid consistency: Thin  Diet effective now                  Inpatient Medications  Scheduled Meds: . vitamin C  500 mg Oral Daily  . baricitinib  4 mg Oral Daily  . cholecalciferol  1,000 Units Oral Daily  . enoxaparin (LOVENOX) injection  60 mg Subcutaneous Q12H  . influenza vac split quadrivalent PF  0.5 mL Intramuscular Tomorrow-1000  . insulin aspart  0-15 Units Subcutaneous TID WC  . insulin aspart  0-5 Units Subcutaneous QHS  . methylPREDNISolone (SOLU-MEDROL) injection  60 mg Intravenous Daily  . metoprolol tartrate  50 mg Oral BID  . nicotine  14  mg Transdermal Daily  . oxymetazoline  1 spray Each Nare BID  . pantoprazole  40 mg Oral Daily  . pneumococcal 23 valent vaccine  0.5 mL Intramuscular Tomorrow-1000  . zinc sulfate  220 mg Oral Daily   Continuous Infusions: . sodium chloride Stopped (10/31/19 1114)   PRN Meds:.sodium chloride, acetaminophen, albuterol, chlorpheniramine-HYDROcodone, guaiFENesin-dextromethorphan, labetalol, lip balm, sodium chloride  Antibiotics  :    Anti-infectives (From admission, onward)   Start     Dose/Rate Route Frequency Ordered Stop   10/31/19 1000  remdesivir 100 mg in sodium chloride 0.9 % 100 mL IVPB        100 mg 200 mL/hr over 30 Minutes Intravenous Daily 10/30/19 1851 11/03/19 1124   10/30/19 1930  remdesivir 100 mg in sodium chloride 0.9 % 100 mL IVPB       "Followed by" Linked Group Details   100 mg 200 mL/hr over 30 Minutes Intravenous  Once 10/30/19 1851 10/30/19 2106   10/30/19 1900  remdesivir 100 mg in sodium chloride 0.9 % 100 mL IVPB       "Followed by" Linked Group Details   100 mg 200 mL/hr over 30 Minutes Intravenous  Once 10/30/19 1851 10/30/19 2106       Mliss Fritz Lylliana Kitamura M.D on  11/06/2019 at 2:33 PM  To page go to [email or web address]   [facility] -  Office  [phone]     See all Orders from today for further details    Objective:   Vitals:   11/06/19 0754 11/06/19 0830 11/06/19 1214 11/06/19 1250  BP: 125/75  139/88   Pulse: (!) 117  94   Resp: (!) 31  (!) 27 20  Temp: 98.2 F (36.8 C)  98.5 F (36.9 C)   TempSrc: Oral  Oral   SpO2: 94% 93% 91%   Weight:      Height:        Wt Readings from Last 3 Encounters:  10/31/19 130.5 kg  12/22/18 124.7 kg  10/15/16 124.7 kg     Intake/Output Summary (Last 24 hours) at 11/06/2019 1433 Last data filed at 11/06/2019 0403 Gross per 24 hour  Intake 600 ml  Output 700 ml  Net -100 ml     Physical Exam  Awake Alert, Oriented X 3, No new F.N deficits, Normal affect Symmetrical Chest wall movement, Good air movement bilaterally, few rales RRR,No Gallops,Rubs or new Murmurs, No Parasternal Heave +ve B.Sounds, Abd Soft, No tenderness, No rebound - guarding or rigidity. No Cyanosis, Clubbing or edema, No new Rash or bruise       Data Review:    CBC Recent Labs  Lab 11/02/19 0500 11/03/19 1027 11/04/19 0445 11/05/19 0306 11/06/19 0503  WBC 10.1 11.9* 11.6* 13.1* 12.3*  HGB 15.1 14.3 14.5 15.0 15.2  HCT 45.1 43.4 43.0 44.4 44.5  PLT 235 281 302 410* 436*  MCV 96.8 95.8 96.2 95.7 95.9  MCH 32.4 31.6 32.4 32.3 32.8  MCHC 33.5 32.9 33.7 33.8 34.2  RDW 11.7 11.7 11.8 11.9 12.0  LYMPHSABS 1.3 1.7 1.1 1.4 1.4  MONOABS 0.7 0.9 0.9 1.0 1.1*  EOSABS 0.0 0.0 0.0 0.0 0.2  BASOSABS 0.0 0.0 0.0 0.0 0.0    Recent Labs  Lab 10/30/19 1918 11/01/19 0611 11/01/19 0934 11/01/19 0934 11/02/19 0500 11/03/19 1027 11/04/19 0445 11/05/19 0306 11/05/19 1411 11/06/19 0503 11/06/19 0504  NA 134*   < >  --    < > 136 137 135 133*  --  130*  --  K 4.0   < >  --    < > 4.3 4.4 4.5 4.0  --  4.1  --   CL 98   < >  --    < > 97* 99 99 97*  --  92*  --   CO2 25   < >  --    < > 26 27 26 22   --  25   --   GLUCOSE 114*   < >  --    < > 157* 128* 157* 141*  --  134*  --   BUN 9   < >  --    < > 13 15 16 18   --  21*  --   CREATININE 0.98   < >  --    < > 1.02 1.02 1.04 0.99  --  1.10  --   CALCIUM 8.2*   < >  --    < > 8.8* 8.9 8.8* 8.9  --  9.0  --   AST 101*   < >  --    < > 79* 58* 48* 46*  --  38  --   ALT 45*   < >  --    < > 52* 60* 60* 62*  --  56*  --   ALKPHOS 95   < >  --    < > 120 121 125 126  --  113  --   BILITOT 1.1   < >  --    < > 1.4* 1.3* 1.0 1.1  --  1.3*  --   ALBUMIN 3.3*   < >  --    < > 3.0* 2.9* 2.8* 2.9*  --  2.7*  --   MG  --   --  2.3   < > 2.1 2.2 2.2 2.1  --  2.3  --   CRP 10.4*   < >  --    < > 7.2* 5.7* 9.1* 11.8*  --  19.8*  --   DDIMER 3.92*   < >  --    < > >20.00* 7.96* 4.58* 3.54*  --  3.39*  --   PROCALCITON <0.10  --  0.10   < > <0.10 0.11 0.14 0.24  --  0.21  --   LATICACIDVEN 1.3  --   --   --   --   --   --   --   --   --   --   TSH  --   --   --   --   --   --   --   --  1.268  --   --   HGBA1C  --   --  5.6  --   --   --   --   --   --   --   --   BNP  --   --  31.3   < > 46.7 48.9 50.9 48.3  --   --  32.4   < > = values in this interval not displayed.    ------------------------------------------------------------------------------------------------------------------ No results for input(s): CHOL, HDL, LDLCALC, TRIG, CHOLHDL, LDLDIRECT in the last 72 hours.  Lab Results  Component Value Date   HGBA1C 5.6 11/01/2019   ------------------------------------------------------------------------------------------------------------------ Recent Labs    11/05/19 1411  TSH 1.268    Cardiac Enzymes No results for input(s): CKMB, TROPONINI, MYOGLOBIN in the last 168 hours.  Invalid input(s): CK ------------------------------------------------------------------------------------------------------------------    Component  Value Date/Time   BNP 32.4 11/06/2019 0504    Micro Results Recent Results (from the past 240 hour(s))  SARS  CORONAVIRUS 2 (TAT 6-24 HRS) Nasopharyngeal Nasopharyngeal Swab     Status: Abnormal   Collection Time: 10/29/19  2:46 PM   Specimen: Nasopharyngeal Swab  Result Value Ref Range Status   SARS Coronavirus 2 POSITIVE (A) NEGATIVE Final    Comment: RESULT CALLED TO, READ BACK BY AND VERIFIED WITH: EMAILED MELISSA BROWNING 0127 10/30/2019 T. TYSOR (NOTE) SARS-CoV-2 target nucleic acids are DETECTED.  The SARS-CoV-2 RNA is generally detectable in upper and lower respiratory specimens during the acute phase of infection. Positive results are indicative of the presence of SARS-CoV-2 RNA. Clinical correlation with patient history and other diagnostic information is  necessary to determine patient infection status. Positive results do not rule out bacterial infection or co-infection with other viruses.  The expected result is Negative.  Fact Sheet for Patients: HairSlick.no  Fact Sheet for Healthcare Providers: quierodirigir.com  This test is not yet approved or cleared by the [place] FDA and  has been authorized for detection and/or diagnosis of SARS-CoV-2 by FDA under an Emergency Use Authorization (EUA). This EUA will remain  in effect (meaning this tes t can be used) for the duration of the COVID-19 declaration under Section 564(b)(1) of the Act, 21 U.S.C. section 360bbb-3(b)(1), unless the authorization is terminated or revoked sooner.   Performed at [facility], [address] [place], [place] [address]   Blood Culture (routine x 2)     Status: None   Collection Time: 10/30/19  7:00 PM   Specimen: BLOOD RIGHT HAND  Result Value Ref Range Status   Specimen Description   Final    BLOOD RIGHT HAND Performed at [facility], [address] [place], [place] [address]    Special Requests   Final    BOTTLES DRAWN AEROBIC AND ANAEROBIC Blood Culture adequate volume Performed at [facility],  [address] [place], [place] [address]    Culture   Final    NO GROWTH 5 DAYS Performed at [facility], [address] [place], [place] [address]    Report Status 11/04/2019 FINAL  Final  Blood Culture (routine x 2)     Status: None   Collection Time: 10/30/19  7:20 PM   Specimen: BLOOD  Result Value Ref Range Status   Specimen Description   Final    BLOOD RIGHT ANTECUBITAL Performed at [facility], [address] [place], [place] [address]    Special Requests   Final    BOTTLES DRAWN AEROBIC AND ANAEROBIC Blood Culture results may not be optimal due to an inadequate volume of blood received in culture bottles Performed at [facility], [address] [place], [place] [address]    Culture   Final    NO GROWTH 5 DAYS Performed at [facility], [address], [place], [place] [address]    Report Status 11/04/2019 FINAL  Final    Radiology Reports CT Angio Chest PE W and/or Wo Contrast  Result Date: 10/30/2019 CLINICAL DATA:  COVID-19 positive, worsening shortness of breath at home, O2 sat 77% on room air EXAM: CT ANGIOGRAPHY CHEST WITH CONTRAST TECHNIQUE: Multidetector CT imaging of the chest was performed using the standard protocol during bolus administration of intravenous contrast. Multiplanar CT image reconstructions and MIPs were obtained to evaluate the vascular anatomy.  CONTRAST:  OMNIPAQUE IOHEXOL 350 MG/ML SOLN COMPARISON:  Radiograph 10/30/2019 FINDINGS: Cardiovascular: Satisfactory opacification the pulmonary arteries to the segmental level. No pulmonary artery filling defects are identified. Central pulmonary arteries are normal caliber. The aortic root is suboptimally assessed given cardiac pulsation artifact. The aorta is normal caliber. No acute luminal abnormality of the imaged aorta. No periaortic stranding or hemorrhage. Normal 3 vessel branching of the aortic arch. Proximal great vessels are unremarkable. Normal heart size.  No pericardial effusion. No major venous abnormalities. Mediastinum/Nodes: No mediastinal fluid or gas. Normal thyroid gland and thoracic inlet. No acute abnormality of the trachea or esophagus. Scattered borderline enlarged nodes with preserved nodal architecture within the mediastinum and hila for instance a 12 mm left paratracheal node (4/37), 10 mm right paratracheal node (4/37), and an 11 mm right hilar node (4/51) are favored to be reactive. No worrisome enlarged mediastinal, hilar or axillary adenopathy. Lungs/Pleura: Multifocal areas of mixed consolidative and ground-glass opacity with interstitial/septal thickening in a crazy paving pattern which is an often reported imaging feature COVID 19 among other infectious and inflammatory etiologies. No pneumothorax or effusion. Mild diffuse airways thickening. Upper Abdomen: No acute abnormalities present in the visualized portions of the upper abdomen. Musculoskeletal: No acute osseous abnormality or suspicious osseous lesion. Multilevel degenerative changes are present in the imaged portions of the spine. No worrisome chest wall lesions. Review of the MIP images confirms the above findings. IMPRESSION: 1. No evidence of pulmonary embolism. 2. Multifocal areas of mixed consolidative and ground-glass opacity with interstitial/septal thickening compatible with reported imaging features of COVID 19. 3. Scattered borderline enlarged nodes within the mediastinum and hila, favored to be reactive. Electronically Signed   By: Kreg Shropshire M.D.   On: 10/30/2019 20:59   DG Chest Port 1 View  Result Date: 11/05/2019 CLINICAL DATA:  Shortness of breath, COVID positive EXAM: PORTABLE CHEST 1 VIEW COMPARISON:  Chest radiograph dated 11/01/2019. FINDINGS: The cardiac silhouette is obscured, but appears unchanged. Moderate bilateral airspace opacities appear decreased on the right and unchanged on the left. No large pleural effusion is identified. There is no pneumothorax.  The osseous structures are unremarkable. IMPRESSION: Moderate bilateral airspace opacities, decreased on the right and unchanged on the left. Electronically Signed   By: Romona Curls M.D.   On: 11/05/2019 08:45   DG Chest Port 1 View  Result Date: 11/01/2019 CLINICAL DATA:  COVID-19 positive with airspace opacity EXAM: PORTABLE CHEST 1 VIEW COMPARISON:  Chest radiograph and chest CT October 30, 2019 FINDINGS: Airspace opacity is again noted throughout much of the right lung with greatest concentration of opacity in the right mid and lower lung regions. Subtle opacity noted in the left mid and lower lung regions. Stable cardiac enlargement. Pulmonary vascularity normal. No adenopathy. No bone lesions. IMPRESSION: Stable multifocal airspace opacity, consistent with known atypical organism pneumonia. No new areas of opacity evident. Stable cardiac prominence. No adenopathy appreciable. Electronically Signed   By: Bretta Bang III M.D.   On: 11/01/2019 08:26   DG Chest Port 1 View  Result Date: 10/30/2019 CLINICAL DATA:  Shortness of breath.  COVID. EXAM: PORTABLE CHEST 1 VIEW COMPARISON:  10/15/2016 FINDINGS: Multifocal airspace opacities throughout the right greater than left lungs. No visible pleural effusions. No pneumothorax. Cardiac silhouette is accentuated by low lung volumes and portable technique. No acute osseous abnormality. IMPRESSION: Multifocal right greater than left airspace opacities, compatible with multifocal pneumonia and reported history of COVID. Electronically Signed   By: Thornton Dales  Yetta BarreJones MD   On: 10/30/2019 19:36   VAS US LOWER EXTREMITY VENOUS (DVT)  Result Date: 11/03/2019  Lower Venous DVTStudy Indications: D-dimer.  Anticoagulation: Lovenox. Comparison Study: No prior studies. Performing Technologist: Jean Rosenthalachel Hodge  Examination Guidelines: A complete evaluation includes B-mode imaging, spectral Doppler, color Doppler, and power Doppler as needed of all accessible  portions of each vessel. Bilateral testing is considered an integral part of a complete examination. Limited examinations for reoccurring indications may be performed as noted. The reflux portion of the exam is performed with the patient in reverse Trendelenburg.  +---------+---------------+---------+-----------+----------+--------------+ RIGHT    CompressibilityPhasicitySpontaneityPropertiesThrombus Aging +---------+---------------+---------+-----------+----------+--------------+ CFV      Full           Yes      Yes                                 +---------+---------------+---------+-----------+----------+--------------+ SFJ      Full                                                        +---------+---------------+---------+-----------+----------+--------------+ FV Prox  Full                                                        +---------+---------------+---------+-----------+----------+--------------+ FV Mid   Full                                                        +---------+---------------+---------+-----------+----------+--------------+ FV DistalFull                                                        +---------+---------------+---------+-----------+----------+--------------+ PFV      Full                                                        +---------+---------------+---------+-----------+----------+--------------+ POP      Full           Yes      Yes                                 +---------+---------------+---------+-----------+----------+--------------+ PTV      Full                                                        +---------+---------------+---------+-----------+----------+--------------+ PERO     Full                                                        +---------+---------------+---------+-----------+----------+--------------+   +---------+---------------+---------+-----------+----------+--------------+  LEFT      CompressibilityPhasicitySpontaneityPropertiesThrombus Aging +---------+---------------+---------+-----------+----------+--------------+ CFV      Full           Yes      Yes                                 +---------+---------------+---------+-----------+----------+--------------+ SFJ      Full                                                        +---------+---------------+---------+-----------+----------+--------------+ FV Prox  Full                                                        +---------+---------------+---------+-----------+----------+--------------+ FV Mid   Full                                                        +---------+---------------+---------+-----------+----------+--------------+ FV DistalFull                                                        +---------+---------------+---------+-----------+----------+--------------+ PFV      Full                                                        +---------+---------------+---------+-----------+----------+--------------+ POP      Full           Yes      Yes                                 +---------+---------------+---------+-----------+----------+--------------+ PTV      Full                                                        +---------+---------------+---------+-----------+----------+--------------+ PERO     Full                                                        +---------+---------------+---------+-----------+----------+--------------+     Summary: RIGHT: - There is no evidence of deep vein thrombosis in the lower extremity.  - No cystic structure found in the popliteal fossa.  LEFT: - There is no evidence of deep vein thrombosis in the lower extremity.  - No  cystic structure found in the popliteal fossa.  *See table(s) above for measurements and observations. Electronically signed by Coral Else MD on 11/03/2019 at 7:41:49 PM.    Final

## 2019-11-07 DIAGNOSIS — J9601 Acute respiratory failure with hypoxia: Secondary | ICD-10-CM | POA: Diagnosis not present

## 2019-11-07 DIAGNOSIS — R7401 Elevation of levels of liver transaminase levels: Secondary | ICD-10-CM | POA: Diagnosis not present

## 2019-11-07 DIAGNOSIS — U071 COVID-19: Secondary | ICD-10-CM | POA: Diagnosis not present

## 2019-11-07 DIAGNOSIS — J1282 Pneumonia due to coronavirus disease 2019: Secondary | ICD-10-CM | POA: Diagnosis not present

## 2019-11-07 LAB — GLUCOSE, CAPILLARY
Glucose-Capillary: 117 mg/dL — ABNORMAL HIGH (ref 70–99)
Glucose-Capillary: 186 mg/dL — ABNORMAL HIGH (ref 70–99)
Glucose-Capillary: 144 mg/dL — ABNORMAL HIGH (ref 70–99)
Glucose-Capillary: 129 mg/dL — ABNORMAL HIGH (ref 70–99)

## 2019-11-07 LAB — CBC WITH DIFFERENTIAL/PLATELET
Abs Immature Granulocytes: 0.14 10*3/uL — ABNORMAL HIGH (ref 0.00–0.07)
Basophils Absolute: 0 10*3/uL (ref 0.0–0.1)
Basophils Relative: 0 %
Eosinophils Absolute: 0.2 10*3/uL (ref 0.0–0.5)
Eosinophils Relative: 2 %
HCT: 42.8 % (ref 39.0–52.0)
Hemoglobin: 14.5 g/dL (ref 13.0–17.0)
Immature Granulocytes: 1 %
Lymphocytes Relative: 10 %
Lymphs Abs: 1.2 10*3/uL (ref 0.7–4.0)
MCH: 32.4 pg (ref 26.0–34.0)
MCHC: 33.9 g/dL (ref 30.0–36.0)
MCV: 95.5 fL (ref 80.0–100.0)
Monocytes Absolute: 1 10*3/uL (ref 0.1–1.0)
Monocytes Relative: 8 %
Neutro Abs: 9.1 10*3/uL — ABNORMAL HIGH (ref 1.7–7.7)
Neutrophils Relative %: 79 %
Platelets: 470 10*3/uL — ABNORMAL HIGH (ref 150–400)
RBC: 4.48 MIL/uL (ref 4.22–5.81)
RDW: 11.9 % (ref 11.5–15.5)
WBC: 11.7 10*3/uL — ABNORMAL HIGH (ref 4.0–10.5)
nRBC: 0 % (ref 0.0–0.2)

## 2019-11-07 LAB — C-REACTIVE PROTEIN: CRP: 20.7 mg/dL — ABNORMAL HIGH (ref <1.0)

## 2019-11-07 LAB — COMPREHENSIVE METABOLIC PANEL
ALT: 47 U/L — ABNORMAL HIGH (ref 0–44)
AST: 34 U/L (ref 15–41)
Albumin: 2.5 g/dL — ABNORMAL LOW (ref 3.5–5.0)
Alkaline Phosphatase: 108 U/L (ref 38–126)
Anion gap: 14 (ref 5–15)
BUN: 20 mg/dL (ref 6–20)
CO2: 24 mmol/L (ref 22–32)
Calcium: 9 mg/dL (ref 8.9–10.3)
Chloride: 94 mmol/L — ABNORMAL LOW (ref 98–111)
Creatinine, Ser: 1.11 mg/dL (ref 0.61–1.24)
GFR calc non Af Amer: >60 mL/min (ref >60)
Glucose, Bld: 91 mg/dL (ref 70–99)
Potassium: 4 mmol/L (ref 3.5–5.1)
Sodium: 132 mmol/L — ABNORMAL LOW (ref 135–145)
Total Bilirubin: 1.3 mg/dL — ABNORMAL HIGH (ref 0.3–1.2)
Total Protein: 7.5 g/dL (ref 6.5–8.1)

## 2019-11-07 LAB — PROCALCITONIN: Procalcitonin: 0.17 ng/mL

## 2019-11-07 LAB — D-DIMER, QUANTITATIVE: D-Dimer, Quant: 3.07 ug/mL-FEU — ABNORMAL HIGH (ref 0.00–0.50)

## 2019-11-07 LAB — BRAIN NATRIURETIC PEPTIDE: B Natriuretic Peptide: 28.9 pg/mL (ref 0.0–100.0)

## 2019-11-07 LAB — MAGNESIUM: Magnesium: 2.3 mg/dL (ref 1.7–2.4)

## 2019-11-07 MED ORDER — MAGIC MOUTHWASH
10.0000 mL | Freq: Four times a day (QID) | ORAL | Status: DC
  Administered 2019-11-07 – 2019-11-14 (×28): 10 mL via ORAL
  Filled 2019-11-07 (×31): qty 10

## 2019-11-07 MED ORDER — METHYLPREDNISOLONE SODIUM SUCC 125 MG IJ SOLR
80.0000 mg | Freq: Two times a day (BID) | INTRAMUSCULAR | Status: DC
  Administered 2019-11-07 – 2019-11-14 (×15): 80 mg via INTRAVENOUS
  Filled 2019-11-07 (×16): qty 2

## 2019-11-07 NOTE — Progress Notes (Addendum)
Occupational Therapy Treatment Patient Details Name: Brandon Snow MRN: [id number] DOB: [birth year] Today's Date: 11/07/2019    History of present illness 59yo male c/o SOB and cough. Hypoxic to 77% in the ED on RA. PE negative. Covid positive. Unvaccinated.  PMH HTN   OT comments  Pt continues to present with increased motivation to participate in therapy. Pt requiring increased O2 this session - 40L via HHFNC with FiO2 at 100%. Providing education on figure four method for LB dressing and pt donning socks while seated in recliner. Pt performing functional mobility with Min Guard A to/from recliner at distances at ~5 feet; 3 reps then seated break for 3 sets. SpO2 maintaining >86% on 40L and pt demonstrating good purse lip breathing techniques. Continue to recommend dc to home once medically stable per physician. Will continue to follow acutely as admitted.   SpO2 98% on 40L with FiO2 at 100% and HR 90 at rest. SpO2 maintaining >87% during mobility.    Follow Up Recommendations  No OT follow up;Supervision/Assistance - 24 hour    Equipment Recommendations  Tub/shower seat    Recommendations for Other Services      Precautions / Restrictions Precautions Precautions: Other (comment) Precaution Comments: watch sats, Covid +       Mobility Bed Mobility               General bed mobility comments: pt sitting up in recliner   Transfers Overall transfer level: Needs assistance Equipment used: None Transfers: Sit to/from Stand Sit to Stand: Supervision         General transfer comment: for safety, slow to rise and steady.    Balance Overall balance assessment: Needs assistance Sitting-balance support: Feet supported;No upper extremity supported Sitting balance-Leahy Scale: Good     Standing balance support: During functional activity;No upper extremity supported Standing balance-Leahy Scale: Fair                             ADL either performed or  assessed with clinical judgement   ADL Overall ADL's : Needs assistance/impaired                     Lower Body Dressing: Min guard;Sit to/from stand Lower Body Dressing Details (indicate cue type and reason): Educating pt on use of figure four method to don socks.  Toilet Transfer: Ambulation;Supervision/safety (simulated at reclienr) Statistician Details (indicate cue type and reason): Supervision for safety         Functional mobility during ADLs: Min guard General ADL Comments: Pt participating in forward and backward walks ~5 feet each. Performing 3 times and then taking seated rest break. Performed this three times total.      Vision       Perception     Praxis      Cognition Arousal/Alertness: Awake/alert Behavior During Therapy: WFL for tasks assessed/performed Overall Cognitive Status: Within Functional Limits for tasks assessed                                 General Comments: Very motivated despite fatigue        Exercises     Shoulder Instructions       General Comments SpO2 98% on 40L with FiO2 at 100% and HR 90 at rest. SpO2 maintaining >87% during mobility. HR elevating to 116    Pertinent Vitals/ Pain  Pain Assessment: No/denies pain  Home Living                                          Prior Functioning/Environment              Frequency  Min 2X/week        Progress Toward Goals  OT Goals(current goals can now be found in the care plan section)  Progress towards OT goals: Progressing toward goals  Acute Rehab OT Goals Patient Stated Goal: home OT Goal Formulation: With patient Time For Goal Achievement: 11/17/19 Potential to Achieve Goals: Good ADL Goals Pt Will Perform Grooming: with supervision;standing Pt Will Perform Upper Body Bathing: with set-up;sitting Pt Will Perform Lower Body Bathing: with supervision;sit to/from stand Pt Will Perform Upper Body Dressing: with  set-up;sitting Pt Will Perform Lower Body Dressing: with supervision;sit to/from stand Pt Will Transfer to Toilet: with supervision;ambulating;regular height toilet;bedside commode;grab bars Pt Will Perform Toileting - Clothing Manipulation and hygiene: with supervision;sit to/from stand Pt Will Perform Tub/Shower Transfer: Tub transfer;with supervision;ambulating;shower seat;tub bench Pt/caregiver will Perform Home Exercise Program: Increased strength;Right Upper extremity;Left upper extremity;With theraband;Independently Additional ADL Goal #1: Pt will tolerate 25 mins therapeutic activity with no more than 4 rest breaks and DOE no >3/4 Additional ADL Goal #2: Pt will independently incorporate 3 energy conservation techniques into daily activities  Plan Discharge plan remains appropriate    Co-evaluation                 AM-PAC OT "6 Clicks" Daily Activity     Outcome Measure   Help from another person eating meals?: None Help from another person taking care of personal grooming?: A Little Help from another person toileting, which includes using toliet, bedpan, or urinal?: A Little Help from another person bathing (including washing, rinsing, drying)?: A Lot Help from another person to put on and taking off regular upper body clothing?: A Little Help from another person to put on and taking off regular lower body clothing?: A Lot 6 Click Score: 17    End of Session Equipment Utilized During Treatment: Oxygen (40L [facility]HFNC)  OT Visit Diagnosis: Unsteadiness on feet (R26.81)   Activity Tolerance Patient limited by fatigue;Patient tolerated treatment well   Patient Left in chair;with call bell/phone within reach   Nurse Communication Mobility status        Time: 9211-9417 OT Time Calculation (min): 24 min  Charges: OT General Charges $OT Visit: 1 Visit OT Treatments $Therapeutic Activity: 23-37 mins  Brandon Snow MSOT, OTR/L Acute Rehab Pager:  [phone] Office: [phone]   Theodoro Grist Scotti Motter 11/07/2019, 5:26 PM

## 2019-11-07 NOTE — Progress Notes (Addendum)
PROGRESS NOTE                                                                                                                                                                                                             Patient Demographics:    Brandon Snow, is a 59 y.o. male, DOB - 09/10/1960, ZOX:[id number]  Outpatient Primary MD for the patient is Patient, No Pcp Per    LOS - 7  Admit date - 10/30/2019    Chief Complaint  Patient presents with  . Cough  . Shortness of Breath       Brief Narrative -  Brandon Snow is a 59 y.o. male with medical history significant of hypertension presented to the ED on 9/29 via EMS for evaluation of shortness of breath and cough.  Oxygen saturation 77% on room air, there he was diagnosed with acute hypoxic respiratory failure due to COVID-19 pneumonia and ARDS.  He was admitted to the hospital and placed on heated high flow.   Subjective:   Patient report dyspnea has improved, still reports some cough, denies any nausea or vomiting .    Assessment  & Plan :    Acute Hypoxic Resp. Failure due to Acute Covid 19 Viral Pneumonitis during the ongoing 2020 Covid 19 Pandemic  - he is unfortunately unvaccinated and has incurred severe parenchymal lung injury with ARDS due to COVID-19 pneumonia,  -Continue with IV steroids .  Patient with increasing CRP, so I have increased his Solu-Medrol to 80 mg every 12 hours -Treated with IV remdesivir . -Treated with baricitinib.  . -Was encouraged to use incentive spirometry, flutter valve, out of bed to chair . -Respiratory status remains very tenuous, but he has less oxygen requirement today, he is down to 30 L heated high flow nasal cannula with no NRB requirement . -On Lasix as needed, appears to be euvolemic today, will hold on further Lasix today.  .  Encouraged the patient to sit up in chair in the daytime use I-S and flutter valve for  pulmonary toiletry and then prone in bed when at night.  Will advance activity and titrate down oxygen as possible.    SpO2: 90 % O2 Flow Rate (L/min): 30 L/min (RN titrated to 30L) FiO2 (%): 100 %   Recent Labs  Lab 11/03/19 1027 11/04/19 0445  11/05/19 0306 11/06/19 0503 11/06/19 0504 11/07/19 0144  WBC 11.9* 11.6* 13.1* 12.3*  --  11.7*  PLT 281 302 410* 436*  --  470*  CRP 5.7* 9.1* 11.8* 19.8*  --  20.7*  BNP 48.9 50.9 48.3  --  32.4 28.9  DDIMER 7.96* 4.58* 3.54* 3.39*  --  3.07*  PROCALCITON 0.11 0.14 0.24 0.21  --  0.17  AST 58* 48* 46* 38  --  34  ALT 60* 60* 62* 56*  --  47*  ALKPHOS 121 125 126 113  --  108  BILITOT 1.3* 1.0 1.1 1.3*  --  1.3*  ALBUMIN 2.9* 2.8* 2.9* 2.7*  --  2.5*     Asymptomatic transaminitis due to viral infection.  Monitor.  Smoking.  Counseled to quit.  Stress-induced hypertension.  On as needed labetalol.  Monitor.  Risisng D-dimer due to inflammation.  - CTA negative, -ve leg ultrasound.  -Continue with Lovenox 0.5 mg/kg every 12 hours  Mild epistaxis .  Completely resolved, nasal saline spray and Afrin ordered, see #5 above.   Oral thrush -Started on Magic mouthwash   Condition - Extremely Guarded  Family Communication  : [name] - [phone] on 11/01/19, 11/03/19, November 04, 2019, November 05, 2019  Code Status :  Full  Consults  :  None  Procedures  :    CTA - no PE  Leg [place] - No DVT   PUD Prophylaxis : PPI  Disposition Plan  :    Status is: Inpatient  Remains inpatient appropriate because:Hemodynamically unstable and IV treatments appropriate due to intensity of illness or inability to take PO   Dispo: The patient is from: Home              Anticipated d/c is to: Home              Anticipated d/c date is: > 3 days              Patient currently is not medically stable to d/c.  DVT Prophylaxis  :  Lovenox   Lab Results  Component Value Date   PLT 470 (H) 11/07/2019    Diet :  Diet Order              Diet Heart Room service appropriate? Yes; Fluid consistency: Thin  Diet effective now                  Inpatient Medications  Scheduled Meds: . vitamin C  500 mg Oral Daily  . baricitinib  4 mg Oral Daily  . cholecalciferol  1,000 Units Oral Daily  . enoxaparin (LOVENOX) injection  60 mg Subcutaneous Q12H  . influenza vac split quadrivalent PF  0.5 mL Intramuscular Tomorrow-1000  . insulin aspart  0-15 Units Subcutaneous TID WC  . insulin aspart  0-5 Units Subcutaneous QHS  . magic mouthwash  10 mL Oral QID  . methylPREDNISolone (SOLU-MEDROL) injection  80 mg Intravenous Q12H  . metoprolol tartrate  50 mg Oral BID  . nicotine  14 mg Transdermal Daily  . oxymetazoline  1 spray Each Nare BID  . pantoprazole  40 mg Oral Daily  . pneumococcal 23 valent vaccine  0.5 mL Intramuscular Tomorrow-1000  . senna-docusate  2 tablet Oral BID  . zinc sulfate  220 mg Oral Daily   Continuous Infusions: . sodium chloride Stopped (10/31/19 1114)   PRN Meds:.sodium chloride, acetaminophen, albuterol, chlorpheniramine-HYDROcodone, guaiFENesin-dextromethorphan, labetalol, lip balm, sodium chloride  Antibiotics  :  Anti-infectives (From admission, onward)   Start     Dose/Rate Route Frequency Ordered Stop   10/31/19 1000  remdesivir 100 mg in sodium chloride 0.9 % 100 mL IVPB        100 mg 200 mL/hr over 30 Minutes Intravenous Daily 10/30/19 1851 11/03/19 1124   10/30/19 1930  remdesivir 100 mg in sodium chloride 0.9 % 100 mL IVPB       "Followed by" Linked Group Details   100 mg 200 mL/hr over 30 Minutes Intravenous  Once 10/30/19 1851 10/30/19 2106   10/30/19 1900  remdesivir 100 mg in sodium chloride 0.9 % 100 mL IVPB       "Followed by" Linked Group Details   100 mg 200 mL/hr over 30 Minutes Intravenous  Once 10/30/19 1851 10/30/19 2106       Mliss Fritz Antoney Biven M.D on 11/07/2019 at 3:26 PM  To page go to [email or web address]   [facility] -  Office  [phone]      See all Orders from today for further details    Objective:   Vitals:   11/07/19 0800 11/07/19 1156 11/07/19 1445 11/07/19 1506  BP:  126/76 123/77   Pulse: (!) 102 91 96   Resp:  20 (!) 22   Temp:  98.1 F (36.7 C)    TempSrc:  Oral    SpO2: 91% 97% 92% 90%  Weight:      Height:        Wt Readings from Last 3 Encounters:  10/31/19 130.5 kg  12/22/18 124.7 kg  10/15/16 124.7 kg     Intake/Output Summary (Last 24 hours) at 11/07/2019 1526 Last data filed at 11/07/2019 1159 Gross per 24 hour  Intake 240 ml  Output 800 ml  Net -560 ml     Physical Exam  Awake Alert, Oriented X 3, No new F.N deficits, Normal affect Symmetrical Chest wall movement, Good air movement bilaterally, scattered rales RRR,No Gallops,Rubs or new Murmurs, No Parasternal Heave +ve B.Sounds, Abd Soft, No tenderness, No rebound - guarding or rigidity. No Cyanosis, Clubbing or edema, No new Rash or bruise       Data Review:    CBC Recent Labs  Lab 11/03/19 1027 11/04/19 0445 11/05/19 0306 11/06/19 0503 11/07/19 0144  WBC 11.9* 11.6* 13.1* 12.3* 11.7*  HGB 14.3 14.5 15.0 15.2 14.5  HCT 43.4 43.0 44.4 44.5 42.8  PLT 281 302 410* 436* 470*  MCV 95.8 96.2 95.7 95.9 95.5  MCH 31.6 32.4 32.3 32.8 32.4  MCHC 32.9 33.7 33.8 34.2 33.9  RDW 11.7 11.8 11.9 12.0 11.9  LYMPHSABS 1.7 1.1 1.4 1.4 1.2  MONOABS 0.9 0.9 1.0 1.1* 1.0  EOSABS 0.0 0.0 0.0 0.2 0.2  BASOSABS 0.0 0.0 0.0 0.0 0.0    Recent Labs  Lab 11/01/19 0934 11/02/19 0500 11/03/19 1027 11/04/19 0445 11/05/19 0306 11/05/19 1411 11/06/19 0503 11/06/19 0504 11/07/19 0144  NA  --    < > 137 135 133*  --  130*  --  132*  K  --    < > 4.4 4.5 4.0  --  4.1  --  4.0  CL  --    < > 99 99 97*  --  92*  --  94*  CO2  --    < > 27 26 22   --  25  --  24  GLUCOSE  --    < > 128* 157* 141*  --  134*  --  91  BUN  --    < >  --  21*  --  20  CREATININE  --    < > 1.02 1.04 0.99  --  1.10  --  1.11  CALCIUM  --    < > 8.9  8.8* 8.9  --  9.0  --  9.0  AST  --    < > 58* 48* 46*  --  38  --  34  ALT  --    < > 60* 60* 62*  --  56*  --  47*  ALKPHOS  --    < > 121 125 126  --  113  --  108  BILITOT  --    < > 1.3* 1.0 1.1  --  1.3*  --  1.3*  ALBUMIN  --    < > 2.9* 2.8* 2.9*  --  2.7*  --  2.5*  MG 2.3   < > 2.2 2.2 2.1  --  2.3  --  2.3  CRP  --    < > 5.7* 9.1* 11.8*  --  19.8*  --  20.7*  DDIMER  --    < > 7.96* 4.58* 3.54*  --  3.39*  --  3.07*  PROCALCITON 0.10   < > 0.11 0.14 0.24  --  0.21  --  0.17  TSH  --   --   --   --   --  1.268  --   --   --   HGBA1C 5.6  --   --   --   --   --   --   --   --   BNP 31.3   < > 48.9 50.9 48.3  --   --  32.4 28.9   < > = values in this interval not displayed.    ------------------------------------------------------------------------------------------------------------------ No results for input(s): CHOL, HDL, LDLCALC, TRIG, CHOLHDL, LDLDIRECT in the last 72 hours.  Lab Results  Component Value Date   HGBA1C 5.6 11/01/2019   ------------------------------------------------------------------------------------------------------------------ Recent Labs    11/05/19 1411  TSH 1.268    Cardiac Enzymes No results for input(s): CKMB, TROPONINI, MYOGLOBIN in the last 168 hours.  Invalid input(s): CK ------------------------------------------------------------------------------------------------------------------    Component Value Date/Time   BNP 28.9 11/07/2019 0144    Micro Results Recent Results (from the past 240 hour(s))  SARS CORONAVIRUS 2 (TAT 6-24 HRS) Nasopharyngeal Nasopharyngeal Swab     Status: Abnormal   Collection Time: 10/29/19  2:46 PM   Specimen: Nasopharyngeal Swab  Result Value Ref Range Status   SARS Coronavirus 2 POSITIVE (A) NEGATIVE Final    Comment: RESULT CALLED TO, READ BACK BY AND VERIFIED WITH: EMAILED MELISSA BROWNING 0127 10/30/2019 T. TYSOR (NOTE) SARS-CoV-2 target nucleic acids are DETECTED.  The SARS-CoV-2 RNA is  generally detectable in upper and lower respiratory specimens during the acute phase of infection. Positive results are indicative of the presence of SARS-CoV-2 RNA. Clinical correlation with patient history and other diagnostic information is  necessary to determine patient infection status. Positive results do not rule out bacterial infection or co-infection with other viruses.  The expected result is Negative.  Fact Sheet for Patients: HairSlick.no  Fact Sheet for Healthcare Providers: quierodirigir.com  This test is not yet approved or cleared by the [place] FDA and  has been authorized for detection and/or diagnosis of SARS-CoV-2 by FDA under an Emergency Use Authorization (EUA). This EUA will remain  in effect (meaning this tes t can be used) for the  duration of the COVID-19 declaration under Section 564(b)(1) of the Act, 21 U.S.C. section 360bbb-3(b)(1), unless the authorization is terminated or revoked sooner.   Performed at [facility], [address], [place], [place] [address]   Blood Culture (routine x 2)     Status: None   Collection Time: 10/30/19  7:00 PM   Specimen: BLOOD RIGHT HAND  Result Value Ref Range Status   Specimen Description   Final    BLOOD RIGHT HAND Performed at [facility], [address] [place], [place] [address]    Special Requests   Final    BOTTLES DRAWN AEROBIC AND ANAEROBIC Blood Culture adequate volume Performed at [facility], [address] [place], [place] [address]    Culture   Final    NO GROWTH 5 DAYS Performed at [facility], [address] [place], [place] [address]    Report Status 11/04/2019 FINAL  Final  Blood Culture (routine x 2)     Status: None   Collection Time: 10/30/19  7:20 PM   Specimen: BLOOD  Result Value Ref Range Status   Specimen Description   Final    BLOOD RIGHT ANTECUBITAL Performed at [facility], [address] [place], [place] [address]    Special Requests   Final    BOTTLES DRAWN AEROBIC AND ANAEROBIC Blood Culture results may not be optimal due to an inadequate volume of blood received in culture bottles Performed at [facility], [address] [place], [place] [address]    Culture   Final    NO GROWTH 5 DAYS Performed at [facility], [address], [place], [place] [address]    Report Status 11/04/2019 FINAL  Final    Radiology Reports CT Angio Chest PE W and/or Wo Contrast  Result Date: 10/30/2019 CLINICAL DATA:  COVID-19 positive, worsening shortness of breath at home, O2 sat 77% on room air EXAM: CT ANGIOGRAPHY CHEST WITH CONTRAST TECHNIQUE: Multidetector CT imaging of the chest was performed using the standard protocol during bolus administration of intravenous contrast. Multiplanar CT image reconstructions and MIPs were obtained to evaluate the vascular anatomy. CONTRAST:  OMNIPAQUE IOHEXOL 350 MG/ML SOLN COMPARISON:  Radiograph 10/30/2019 FINDINGS: Cardiovascular: Satisfactory opacification the pulmonary arteries to the segmental level. No pulmonary artery filling defects are identified. Central pulmonary arteries are normal caliber. The aortic root is suboptimally assessed given cardiac pulsation artifact. The aorta is normal caliber. No acute luminal abnormality of the imaged aorta. No periaortic stranding or hemorrhage. Normal 3 vessel branching of the aortic arch. Proximal great vessels are unremarkable. Normal heart size. No pericardial effusion. No major venous abnormalities. Mediastinum/Nodes: No mediastinal fluid or gas. Normal thyroid gland and thoracic inlet. No acute abnormality of the trachea or esophagus. Scattered borderline enlarged nodes with preserved nodal architecture within the mediastinum and hila for instance a 12 mm left paratracheal node (4/37), 10 mm right paratracheal node (4/37), and an 11 mm right hilar node (4/51) are  favored to be reactive. No worrisome enlarged mediastinal, hilar or axillary adenopathy. Lungs/Pleura: Multifocal areas of mixed consolidative and ground-glass opacity with interstitial/septal thickening in a crazy paving pattern which is an often reported imaging feature COVID 19 among other infectious and inflammatory etiologies. No pneumothorax or effusion. Mild diffuse airways thickening. Upper Abdomen: No acute abnormalities present in the visualized portions of the upper abdomen. Musculoskeletal: No acute osseous abnormality or suspicious osseous  lesion. Multilevel degenerative changes are present in the imaged portions of the spine. No worrisome chest wall lesions. Review of the MIP images confirms the above findings. IMPRESSION: 1. No evidence of pulmonary embolism. 2. Multifocal areas of mixed consolidative and ground-glass opacity with interstitial/septal thickening compatible with reported imaging features of COVID 19. 3. Scattered borderline enlarged nodes within the mediastinum and hila, favored to be reactive. Electronically Signed   By: Kreg Shropshire M.D.   On: 10/30/2019 20:59   DG Chest Port 1 View  Result Date: 11/05/2019 CLINICAL DATA:  Shortness of breath, COVID positive EXAM: PORTABLE CHEST 1 VIEW COMPARISON:  Chest radiograph dated 11/01/2019. FINDINGS: The cardiac silhouette is obscured, but appears unchanged. Moderate bilateral airspace opacities appear decreased on the right and unchanged on the left. No large pleural effusion is identified. There is no pneumothorax. The osseous structures are unremarkable. IMPRESSION: Moderate bilateral airspace opacities, decreased on the right and unchanged on the left. Electronically Signed   By: Romona Curls M.D.   On: 11/05/2019 08:45   DG Chest Port 1 View  Result Date: 11/01/2019 CLINICAL DATA:  COVID-19 positive with airspace opacity EXAM: PORTABLE CHEST 1 VIEW COMPARISON:  Chest radiograph and chest CT October 30, 2019 FINDINGS: Airspace  opacity is again noted throughout much of the right lung with greatest concentration of opacity in the right mid and lower lung regions. Subtle opacity noted in the left mid and lower lung regions. Stable cardiac enlargement. Pulmonary vascularity normal. No adenopathy. No bone lesions. IMPRESSION: Stable multifocal airspace opacity, consistent with known atypical organism pneumonia. No new areas of opacity evident. Stable cardiac prominence. No adenopathy appreciable. Electronically Signed   By: Bretta Bang III M.D.   On: 11/01/2019 08:26   DG Chest Port 1 View  Result Date: 10/30/2019 CLINICAL DATA:  Shortness of breath.  COVID. EXAM: PORTABLE CHEST 1 VIEW COMPARISON:  10/15/2016 FINDINGS: Multifocal airspace opacities throughout the right greater than left lungs. No visible pleural effusions. No pneumothorax. Cardiac silhouette is accentuated by low lung volumes and portable technique. No acute osseous abnormality. IMPRESSION: Multifocal right greater than left airspace opacities, compatible with multifocal pneumonia and reported history of COVID. Electronically Signed   By: Feliberto Harts MD   On: 10/30/2019 19:36   VAS [place] LOWER EXTREMITY VENOUS (DVT)  Result Date: 11/03/2019  Lower Venous DVTStudy Indications: D-dimer.  Anticoagulation: Lovenox. Comparison Study: No prior studies. Performing Technologist: Jean Rosenthal  Examination Guidelines: A complete evaluation includes B-mode imaging, spectral Doppler, color Doppler, and power Doppler as needed of all accessible portions of each vessel. Bilateral testing is considered an integral part of a complete examination. Limited examinations for reoccurring indications may be performed as noted. The reflux portion of the exam is performed with the patient in reverse Trendelenburg.  +---------+---------------+---------+-----------+----------+--------------+ RIGHT    CompressibilityPhasicitySpontaneityPropertiesThrombus Aging  +---------+---------------+---------+-----------+----------+--------------+ CFV      Full           Yes      Yes                                 +---------+---------------+---------+-----------+----------+--------------+ SFJ      Full                                                        +---------+---------------+---------+-----------+----------+--------------+  FV Prox  Full                                                        +---------+---------------+---------+-----------+----------+--------------+ FV Mid   Full                                                        +---------+---------------+---------+-----------+----------+--------------+ FV DistalFull                                                        +---------+---------------+---------+-----------+----------+--------------+ PFV      Full                                                        +---------+---------------+---------+-----------+----------+--------------+ POP      Full           Yes      Yes                                 +---------+---------------+---------+-----------+----------+--------------+ PTV      Full                                                        +---------+---------------+---------+-----------+----------+--------------+ PERO     Full                                                        +---------+---------------+---------+-----------+----------+--------------+   +---------+---------------+---------+-----------+----------+--------------+ LEFT     CompressibilityPhasicitySpontaneityPropertiesThrombus Aging +---------+---------------+---------+-----------+----------+--------------+ CFV      Full           Yes      Yes                                 +---------+---------------+---------+-----------+----------+--------------+ SFJ      Full                                                         +---------+---------------+---------+-----------+----------+--------------+ FV Prox  Full                                                        +---------+---------------+---------+-----------+----------+--------------+  FV Mid   Full                                                        +---------+---------------+---------+-----------+----------+--------------+ FV DistalFull                                                        +---------+---------------+---------+-----------+----------+--------------+ PFV      Full                                                        +---------+---------------+---------+-----------+----------+--------------+ POP      Full           Yes      Yes                                 +---------+---------------+---------+-----------+----------+--------------+ PTV      Full                                                        +---------+---------------+---------+-----------+----------+--------------+ PERO     Full                                                        +---------+---------------+---------+-----------+----------+--------------+     Summary: RIGHT: - There is no evidence of deep vein thrombosis in the lower extremity.  - No cystic structure found in the popliteal fossa.  LEFT: - There is no evidence of deep vein thrombosis in the lower extremity.  - No cystic structure found in the popliteal fossa.  *See table(s) above for measurements and observations. Electronically signed by Coral Else MD on 11/03/2019 at 7:41:49 PM.    Final

## 2019-11-07 NOTE — TOC Progression Note (Signed)
Transition of Care [facility]) - Progression Note    Patient Details  Name: Brandon Snow MRN: [id number] Date of Birth: 10/12/1960  [facility] CM/SW Contact  Lockie Pares, RN Phone Number: 11/07/2019, 5:00 PM  Clinical Narrative:    30L Heated high flow Was on lasix holding today. 90% saturations day 7. Discharge is still quite a few days away. Will need likely  oxygen, no insurance cont to follow for DME and medication needs.    Expected Discharge Plan: Home/Self Care Barriers to Discharge: Continued Medical Work up  Expected Discharge Plan and Services Expected Discharge Plan: Home/Self Care   Discharge Planning Services: CM Consult   Living arrangements for the past 2 months: Apartment                                       Social Determinants of Health (SDOH) Interventions    Readmission Risk Interventions No flowsheet data found.

## 2019-11-08 DIAGNOSIS — I10 Essential (primary) hypertension: Secondary | ICD-10-CM | POA: Diagnosis not present

## 2019-11-08 DIAGNOSIS — R7401 Elevation of levels of liver transaminase levels: Secondary | ICD-10-CM | POA: Diagnosis not present

## 2019-11-08 DIAGNOSIS — J1282 Pneumonia due to coronavirus disease 2019: Secondary | ICD-10-CM | POA: Diagnosis not present

## 2019-11-08 DIAGNOSIS — U071 COVID-19: Secondary | ICD-10-CM | POA: Diagnosis not present

## 2019-11-08 LAB — CBC WITH DIFFERENTIAL/PLATELET
Abs Immature Granulocytes: 0.17 10*3/uL — ABNORMAL HIGH (ref 0.00–0.07)
Basophils Absolute: 0 10*3/uL (ref 0.0–0.1)
Basophils Relative: 0 %
Eosinophils Absolute: 0.2 10*3/uL (ref 0.0–0.5)
Eosinophils Relative: 2 %
HCT: 40.8 % (ref 39.0–52.0)
Hemoglobin: 13.7 g/dL (ref 13.0–17.0)
Immature Granulocytes: 2 %
Lymphocytes Relative: 13 %
Lymphs Abs: 1.3 10*3/uL (ref 0.7–4.0)
MCH: 32 pg (ref 26.0–34.0)
MCHC: 33.6 g/dL (ref 30.0–36.0)
MCV: 95.3 fL (ref 80.0–100.0)
Monocytes Absolute: 0.8 10*3/uL (ref 0.1–1.0)
Monocytes Relative: 8 %
Neutro Abs: 7.6 10*3/uL (ref 1.7–7.7)
Neutrophils Relative %: 75 %
Platelets: 470 10*3/uL — ABNORMAL HIGH (ref 150–400)
RBC: 4.28 MIL/uL (ref 4.22–5.81)
RDW: 11.8 % (ref 11.5–15.5)
WBC: 10.2 10*3/uL (ref 4.0–10.5)
nRBC: 0 % (ref 0.0–0.2)

## 2019-11-08 LAB — COMPREHENSIVE METABOLIC PANEL
ALT: 46 U/L — ABNORMAL HIGH (ref 0–44)
AST: 32 U/L (ref 15–41)
Albumin: 2.4 g/dL — ABNORMAL LOW (ref 3.5–5.0)
Alkaline Phosphatase: 109 U/L (ref 38–126)
Anion gap: 11 (ref 5–15)
BUN: 20 mg/dL (ref 6–20)
CO2: 28 mmol/L (ref 22–32)
Calcium: 9 mg/dL (ref 8.9–10.3)
Chloride: 92 mmol/L — ABNORMAL LOW (ref 98–111)
Creatinine, Ser: 1.15 mg/dL (ref 0.61–1.24)
GFR calc non Af Amer: >60 mL/min (ref >60)
Glucose, Bld: 109 mg/dL — ABNORMAL HIGH (ref 70–99)
Potassium: 3.8 mmol/L (ref 3.5–5.1)
Sodium: 131 mmol/L — ABNORMAL LOW (ref 135–145)
Total Bilirubin: 1.3 mg/dL — ABNORMAL HIGH (ref 0.3–1.2)
Total Protein: 7.5 g/dL (ref 6.5–8.1)

## 2019-11-08 LAB — C-REACTIVE PROTEIN: CRP: 18.8 mg/dL — ABNORMAL HIGH (ref <1.0)

## 2019-11-08 LAB — PROCALCITONIN: Procalcitonin: 0.2 ng/mL

## 2019-11-08 LAB — GLUCOSE, CAPILLARY
Glucose-Capillary: 134 mg/dL — ABNORMAL HIGH (ref 70–99)
Glucose-Capillary: 198 mg/dL — ABNORMAL HIGH (ref 70–99)
Glucose-Capillary: 130 mg/dL — ABNORMAL HIGH (ref 70–99)
Glucose-Capillary: 161 mg/dL — ABNORMAL HIGH (ref 70–99)

## 2019-11-08 LAB — BRAIN NATRIURETIC PEPTIDE: B Natriuretic Peptide: 34.1 pg/mL (ref 0.0–100.0)

## 2019-11-08 LAB — MAGNESIUM: Magnesium: 2.4 mg/dL (ref 1.7–2.4)

## 2019-11-08 LAB — D-DIMER, QUANTITATIVE: D-Dimer, Quant: 4.2 ug/mL-FEU — ABNORMAL HIGH (ref 0.00–0.50)

## 2019-11-08 NOTE — Progress Notes (Signed)
Physical Therapy Treatment Patient Details Name: Brandon Snow MRN: [id number] DOB: 1960-03-14 Today's Date: 11/08/2019    History of Present Illness 58yo male c/o SOB and cough. Hypoxic to 77% in the ED on RA. PE negative. Covid positive. Unvaccinated.  PMH HTN    PT Comments    Pt very motivated to get up and walk with therapy. Pt is down to 20 L HHFNC and ambulation is limited, however RN hopeful that they will be able to transition to  HFNC this afternoon. Pt is min guard for ambulation of 48 feet with out AD. Pt continues to progress towards his goals. D/c plans remain appropriate.   Pt on 20 L HHFNC with SaO2 96%O2 on entry with ambulation dropped to 83%O2, requires 6 min purse lip breathing to rebound to 88%O2. Rebounded to 90%O2 with seated LE exercise.   Follow Up Recommendations  Home health PT     Equipment Recommendations  Rolling walker with 5" wheels;3in1 (PT)       Precautions / Restrictions Precautions Precautions: Other (comment) Precaution Comments: watch sats Restrictions Weight Bearing Restrictions: No    Mobility  Bed Mobility               General bed mobility comments: pt sitting up in recliner   Transfers Overall transfer level: Needs assistance Equipment used: None Transfers: Sit to/from Stand Sit to Stand: Supervision Stand pivot transfers: Min guard       General transfer comment: for safety, slow to rise and steady.  Ambulation/Gait Ambulation/Gait assistance: Min guard Gait Distance (Feet): 48 Feet (8' forward, 8 feet back x 3) Assistive device: None Gait Pattern/deviations: Step-through pattern;Decreased stride length Gait velocity: decr Gait velocity interpretation: <1.8 ft/sec, indicate of risk for recurrent falls General Gait Details: min guard for safety, no AD, limited by HHF O2, back and forth 8' x 4         Balance Overall balance assessment: Needs assistance Sitting-balance support: Feet supported;No upper  extremity supported Sitting balance-Leahy Scale: Good     Standing balance support: During functional activity;No upper extremity supported Standing balance-Leahy Scale: Fair                              Cognition Arousal/Alertness: Awake/alert Behavior During Therapy: WFL for tasks assessed/performed Overall Cognitive Status: Within Functional Limits for tasks assessed                                 General Comments: Very motivated despite fatigue      Exercises General Exercises - Lower Extremity Long Arc Quad: AROM;Both;10 reps;Seated Hip ABduction/ADduction: AROM;10 reps;Both;Seated Hip Flexion/Marching: AROM;Both;10 reps;Seated Toe Raises: AROM;Both;10 reps;Seated Heel Raises: AROM;Both;10 reps;Seated Other Exercises Other Exercises: ambulation of 48 feet back and forth    General Comments General comments (skin integrity, edema, etc.): Pt on 20 L 100% FiO2, 96%O2 on entry, with ambulation dropped to 83%O2, requires increased cuing in purse lip breathing for 6 minutes to rebound to 88%O2, performed seated exercise and pt able to rebound to 90%O2, pt with increased fatigue at end of session        Pertinent Vitals/Pain Pain Assessment: No/denies pain           PT Goals (current goals can now be found in the care plan section) Acute Rehab PT Goals Patient Stated Goal: home PT Goal Formulation: With patient Time For  Goal Achievement: 11/15/19 Potential to Achieve Goals: Fair Progress towards PT goals: Progressing toward goals    Frequency    Min 3X/week      PT Plan Current plan remains appropriate       AM-PAC PT "6 Clicks" Mobility   Outcome Measure  Help needed turning from your back to your side while in a flat bed without using bedrails?: A Little Help needed moving from lying on your back to sitting on the side of a flat bed without using bedrails?: A Little Help needed moving to and from a bed to a chair (including a  wheelchair)?: A Little Help needed standing up from a chair using your arms (e.g., wheelchair or bedside chair)?: A Little Help needed to walk in hospital room?: A Little Help needed climbing 3-5 steps with a railing? : A Lot 6 Click Score: 17    End of Session Equipment Utilized During Treatment: Oxygen Activity Tolerance: Patient tolerated treatment well Patient left: in chair;with call bell/phone within reach Nurse Communication: Mobility status PT Visit Diagnosis: Unsteadiness on feet (R26.81);Difficulty in walking, not elsewhere classified (R26.2);Muscle weakness (generalized) (M62.81)     Time: 3491-7915 PT Time Calculation (min) (ACUTE ONLY): 41 min  Charges:  $Therapeutic Exercise: 38-52 mins                     Anandi Abramo B. Beverely Risen PT, DPT Acute Rehabilitation Services Pager [phone] Office [phone]    Elon Alas Fleet 11/08/2019, 12:17 PM

## 2019-11-08 NOTE — Progress Notes (Signed)
PROGRESS NOTE                                                                                                                                                                                                             Patient Demographics:    Brandon EwingCarlos Snow, is a 59 y.o. male, DOB - [birth year], ZOX:[id number]  Outpatient Primary MD for the patient is Patient, No Pcp Per    LOS - 8  Admit date - 10/30/2019    Chief Complaint  Patient presents with  . Cough  . Shortness of Breath       Brief Narrative  -  Brandon Snow is a 59 y.o. male with medical history significant of hypertension presented to the ED on 9/29 via EMS for evaluation of shortness of breath and cough.  Oxygen saturation 77% on room air, there he was diagnosed with acute hypoxic respiratory failure due to COVID-19 pneumonia and ARDS.  He was admitted to the hospital and placed on heated high flow.   Subjective:   Patient still reports dyspnea, but feeling better, significant with activity, still reports cough, denies epistaxis, nausea or vomiting .   Assessment  & Plan :    Acute Hypoxic Resp. Failure due to Acute Covid 19 Viral Pneumonitis during the ongoing 2020 Covid 19 Pandemic  - he is unfortunately unvaccinated and has incurred severe parenchymal lung injury with ARDS due to COVID-19 pneumonia,  -Continue with IV steroids . -Treated with IV remdesivir . -Treated with baricitinib.  . -Was encouraged to use incentive spirometry, flutter valve, out of bed to chair . -Respiratory status remains very tenuous, but he has less oxygen requirement today, he remains on 30 L heated high flow nasal cannula today, continue to wean as tolerated . -On Lasix as needed, appears to be euvolemic today, will hold on further Lasix today.  .  Encouraged the patient to sit up in chair in the daytime use I-S and flutter valve for pulmonary toiletry and then prone in  bed when at night.  Will advance activity and titrate down oxygen as possible.    SpO2: 96 % O2 Flow Rate (L/min): 30 L/min FiO2 (%): 100 %   Recent Labs  Lab 11/04/19 0445 11/05/19 0306 11/06/19 0503 11/06/19 0504 11/07/19 0144 11/08/19 0454  WBC 11.6* 13.1* 12.3*  --  11.7* 10.2  PLT 302 410* 436*  --  470* 470*  CRP 9.1* 11.8* 19.8*  --  20.7* 18.8*  BNP 50.9 48.3  --  32.4 28.9 34.1  DDIMER 4.58* 3.54* 3.39*  --  3.07* 4.20*  PROCALCITON 0.14 0.24 0.21  --  0.17 0.20  AST 48* 46* 38  --  34 32  ALT 60* 62* 56*  --  47* 46*  ALKPHOS 125 126 113  --  108 109  BILITOT 1.0 1.1 1.3*  --  1.3* 1.3*  ALBUMIN 2.8* 2.9* 2.7*  --  2.5* 2.4*     Asymptomatic transaminitis due to viral infection.  Trending down, continue to monitor  Smoking.  Counseled to quit.  Stress-induced hypertension.  On as needed labetalol.  Monitor.  Risisng D-dimer due to inflammation.  - CTA negative, -ve leg ultrasound.  -Continue with Lovenox 0.5 mg/kg every 12 hours  Mild epistaxis .  Completely resolved, nasal saline spray and Afrin ordered, see #5 above.  Oral thrush -Started on Magic mouthwash   Condition - Extremely Guarded  Family Communication  : [name] - [phone] on 11/01/19, 11/03/19, November 04, 2019, November 05, 2019 ,11/08/2019  Code Status :  Full  Consults  :  None  Procedures  :    CTA - no PE  Leg [place] - No DVT   PUD Prophylaxis : PPI  Disposition Plan  :    Status is: Inpatient  Remains inpatient appropriate because:Hemodynamically unstable and IV treatments appropriate due to intensity of illness or inability to take PO   Dispo: The patient is from: Home              Anticipated d/c is to: Home              Anticipated d/c date is: > 3 days              Patient currently is not medically stable to d/c.  DVT Prophylaxis  :  Lovenox   Lab Results  Component Value Date   PLT 470 (H) 11/08/2019    Diet :  Diet Order            Diet Heart  Room service appropriate? Yes; Fluid consistency: Thin  Diet effective now                  Inpatient Medications  Scheduled Meds: . vitamin C  500 mg Oral Daily  . baricitinib  4 mg Oral Daily  . cholecalciferol  1,000 Units Oral Daily  . enoxaparin (LOVENOX) injection  60 mg Subcutaneous Q12H  . influenza vac split quadrivalent PF  0.5 mL Intramuscular Tomorrow-1000  . insulin aspart  0-15 Units Subcutaneous TID WC  . insulin aspart  0-5 Units Subcutaneous QHS  . magic mouthwash  10 mL Oral QID  . methylPREDNISolone (SOLU-MEDROL) injection  80 mg Intravenous Q12H  . metoprolol tartrate  50 mg Oral BID  . nicotine  14 mg Transdermal Daily  . oxymetazoline  1 spray Each Nare BID  . pantoprazole  40 mg Oral Daily  . pneumococcal 23 valent vaccine  0.5 mL Intramuscular Tomorrow-1000  . senna-docusate  2 tablet Oral BID  . zinc sulfate  220 mg Oral Daily   Continuous Infusions: . sodium chloride Stopped (10/31/19 1114)   PRN Meds:.sodium chloride, acetaminophen, albuterol, chlorpheniramine-HYDROcodone, guaiFENesin-dextromethorphan, labetalol, lip balm, sodium chloride  Antibiotics  :    Anti-infectives (From admission, onward)   Start  Dose/Rate Route Frequency Ordered Stop   10/31/19 1000  remdesivir 100 mg in sodium chloride 0.9 % 100 mL IVPB        100 mg 200 mL/hr over 30 Minutes Intravenous Daily 10/30/19 1851 11/03/19 1124   10/30/19 1930  remdesivir 100 mg in sodium chloride 0.9 % 100 mL IVPB       "Followed by" Linked Group Details   100 mg 200 mL/hr over 30 Minutes Intravenous  Once 10/30/19 1851 10/30/19 2106   10/30/19 1900  remdesivir 100 mg in sodium chloride 0.9 % 100 mL IVPB       "Followed by" Linked Group Details   100 mg 200 mL/hr over 30 Minutes Intravenous  Once 10/30/19 1851 10/30/19 2106       Mliss Fritz Jasia Hiltunen M.D on 11/08/2019 at 2:17 PM  To page go to [email or web address]   [facility] -  Office  [phone]     See all Orders from  today for further details    Objective:   Vitals:   11/08/19 0710 11/08/19 0719 11/08/19 0754 11/08/19 1219  BP:   120/79 128/83  Pulse:  (!) 103 99 84  Resp:   (!) 24   Temp:   98.1 F (36.7 C) 97.8 F (36.6 C)  TempSrc:   Oral Oral  SpO2: 93%  90% 96%  Weight:      Height:        Wt Readings from Last 3 Encounters:  10/31/19 130.5 kg  12/22/18 124.7 kg  10/15/16 124.7 kg     Intake/Output Summary (Last 24 hours) at 11/08/2019 1417 Last data filed at 11/08/2019 1100 Gross per 24 hour  Intake 1040 ml  Output 1270 ml  Net -230 ml     Physical Exam  Awake Alert, Oriented X 3, No new F.N deficits, Normal affect Symmetrical Chest wall movement, Good air movement bilaterally,scattered rales RRR,No Gallops,Rubs or new Murmurs, No Parasternal Heave +ve B.Sounds, Abd Soft, No tenderness, No rebound - guarding or rigidity. No Cyanosis, Clubbing or edema, No new Rash or bruise      Data Review:    CBC Recent Labs  Lab 11/04/19 0445 11/05/19 0306 11/06/19 0503 11/07/19 0144 11/08/19 0454  WBC 11.6* 13.1* 12.3* 11.7* 10.2  HGB 14.5 15.0 15.2 14.5 13.7  HCT 43.0 44.4 44.5 42.8 40.8  PLT 302 410* 436* 470* 470*  MCV 96.2 95.7 95.9 95.5 95.3  MCH 32.4 32.3 32.8 32.4 32.0  MCHC 33.7 33.8 34.2 33.9 33.6  RDW 11.8 11.9 12.0 11.9 11.8  LYMPHSABS 1.1 1.4 1.4 1.2 1.3  MONOABS 0.9 1.0 1.1* 1.0 0.8  EOSABS 0.0 0.0 0.2 0.2 0.2  BASOSABS 0.0 0.0 0.0 0.0 0.0    Recent Labs  Lab 11/04/19 0445 11/05/19 0306 11/05/19 1411 11/06/19 0503 11/06/19 0504 11/07/19 0144 11/08/19 0454  NA 135 133*  --  130*  --  132* 131*  K 4.5 4.0  --  4.1  --  4.0 3.8  CL 99 97*  --  92*  --  94* 92*  CO2 26 22  --  25  --  24 28  GLUCOSE 157* 141*  --  134*  --  91 109*  BUN 16 18  --  21*  --  20 20  CREATININE 1.04 0.99  --  1.10  --  1.11 1.15  CALCIUM 8.8* 8.9  --  9.0  --  9.0 9.0  AST 48* 46*  --  38  --  34  32  ALT 60* 62*  --  56*  --  47* 46*  ALKPHOS 125 126  --  113  --   108 109  BILITOT 1.0 1.1  --  1.3*  --  1.3* 1.3*  ALBUMIN 2.8* 2.9*  --  2.7*  --  2.5* 2.4*  MG 2.2 2.1  --  2.3  --  2.3 2.4  CRP 9.1* 11.8*  --  19.8*  --  20.7* 18.8*  DDIMER 4.58* 3.54*  --  3.39*  --  3.07* 4.20*  PROCALCITON 0.14 0.24  --  0.21  --  0.17 0.20  TSH  --   --  1.268  --   --   --   --   BNP 50.9 48.3  --   --  32.4 28.9 34.1    ------------------------------------------------------------------------------------------------------------------ No results for input(s): CHOL, HDL, LDLCALC, TRIG, CHOLHDL, LDLDIRECT in the last 72 hours.  Lab Results  Component Value Date   HGBA1C 5.6 11/01/2019   ------------------------------------------------------------------------------------------------------------------ No results for input(s): TSH, T4TOTAL, T3FREE, THYROIDAB in the last 72 hours.  Invalid input(s): FREET3  Cardiac Enzymes No results for input(s): CKMB, TROPONINI, MYOGLOBIN in the last 168 hours.  Invalid input(s): CK ------------------------------------------------------------------------------------------------------------------    Component Value Date/Time   BNP 34.1 11/08/2019 0454    Micro Results Recent Results (from the past 240 hour(s))  SARS CORONAVIRUS 2 (TAT 6-24 HRS) Nasopharyngeal Nasopharyngeal Swab     Status: Abnormal   Collection Time: 10/29/19  2:46 PM   Specimen: Nasopharyngeal Swab  Result Value Ref Range Status   SARS Coronavirus 2 POSITIVE (A) NEGATIVE Final    Comment: RESULT CALLED TO, READ BACK BY AND VERIFIED WITH: EMAILED MELISSA BROWNING 0127 10/30/2019 T. TYSOR (NOTE) SARS-CoV-2 target nucleic acids are DETECTED.  The SARS-CoV-2 RNA is generally detectable in upper and lower respiratory specimens during the acute phase of infection. Positive results are indicative of the presence of SARS-CoV-2 RNA. Clinical correlation with patient history and other diagnostic information is  necessary to determine patient infection  status. Positive results do not rule out bacterial infection or co-infection with other viruses.  The expected result is Negative.  Fact Sheet for Patients: HairSlick.no  Fact Sheet for Healthcare Providers: quierodirigir.com  This test is not yet approved or cleared by the [place] FDA and  has been authorized for detection and/or diagnosis of SARS-CoV-2 by FDA under an Emergency Use Authorization (EUA). This EUA will remain  in effect (meaning this tes t can be used) for the duration of the COVID-19 declaration under Section 564(b)(1) of the Act, 21 U.S.C. section 360bbb-3(b)(1), unless the authorization is terminated or revoked sooner.   Performed at [facility], [address] [place], [place] [address]   Blood Culture (routine x 2)     Status: None   Collection Time: 10/30/19  7:00 PM   Specimen: BLOOD RIGHT HAND  Result Value Ref Range Status   Specimen Description   Final    BLOOD RIGHT HAND Performed at [facility], [address] [place], [place] [address]    Special Requests   Final    BOTTLES DRAWN AEROBIC AND ANAEROBIC Blood Culture adequate volume Performed at [facility], [address] [place], [place] [address]    Culture   Final    NO GROWTH 5 DAYS Performed at [facility], [address] [place], [place] [address]    Report Status 11/04/2019 FINAL  Final  Blood Culture (routine x 2)     Status: None   Collection Time: 10/30/19  7:20 PM   Specimen: BLOOD  Result Value Ref Range Status   Specimen Description   Final    BLOOD RIGHT ANTECUBITAL Performed at [facility], [address], [place], [place] [address]    Special Requests   Final    BOTTLES DRAWN AEROBIC AND ANAEROBIC Blood Culture results may not be optimal due to an inadequate volume of blood received in culture bottles Performed at [facility], [address] [place], [place] [address]    Culture   Final    NO GROWTH 5 DAYS Performed at [facility], [address], [place], [place] [address]    Report Status 11/04/2019 FINAL  Final    Radiology Reports CT Angio Chest PE W and/or Wo Contrast  Result Date: 10/30/2019 CLINICAL DATA:  COVID-19 positive, worsening shortness of breath at home, O2 sat 77% on room air EXAM: CT ANGIOGRAPHY CHEST WITH CONTRAST TECHNIQUE: Multidetector CT imaging of the chest was performed using the standard protocol during bolus administration of intravenous contrast. Multiplanar CT image reconstructions and MIPs were obtained to evaluate the vascular anatomy. CONTRAST:  OMNIPAQUE IOHEXOL 350 MG/ML SOLN COMPARISON:  Radiograph 10/30/2019 FINDINGS: Cardiovascular: Satisfactory opacification the pulmonary arteries to the segmental level. No pulmonary artery filling defects are identified. Central pulmonary arteries are normal caliber. The aortic root is suboptimally assessed given cardiac pulsation artifact. The aorta is normal caliber. No acute luminal abnormality of the imaged aorta. No periaortic stranding or hemorrhage. Normal 3 vessel branching of the aortic arch. Proximal great vessels are unremarkable. Normal heart size. No pericardial effusion. No major venous abnormalities. Mediastinum/Nodes: No mediastinal fluid or gas. Normal thyroid gland and thoracic inlet. No acute abnormality of the trachea or esophagus. Scattered borderline enlarged nodes with preserved nodal architecture within the mediastinum and hila for instance a 12 mm left paratracheal node (4/37), 10 mm right paratracheal node (4/37), and an 11 mm right hilar node (4/51) are favored to be reactive. No worrisome enlarged mediastinal, hilar or axillary adenopathy. Lungs/Pleura: Multifocal areas of mixed consolidative and ground-glass opacity with interstitial/septal thickening in a crazy paving pattern which is an often reported imaging feature COVID 19 among other  infectious and inflammatory etiologies. No pneumothorax or effusion. Mild diffuse airways thickening. Upper Abdomen: No acute abnormalities present in the visualized portions of the upper abdomen. Musculoskeletal: No acute osseous abnormality or suspicious osseous lesion. Multilevel degenerative changes are present in the imaged portions of the spine. No worrisome chest wall lesions. Review of the MIP images confirms the above findings. IMPRESSION: 1. No evidence of pulmonary embolism. 2. Multifocal areas of mixed consolidative and ground-glass opacity with interstitial/septal thickening compatible with reported imaging features of COVID 19. 3. Scattered borderline enlarged nodes within the mediastinum and hila, favored to be reactive. Electronically Signed   By: Kreg Shropshire M.D.   On: 10/30/2019 20:59   DG Chest Port 1 View  Result Date: 11/05/2019 CLINICAL DATA:  Shortness of breath, COVID positive EXAM: PORTABLE CHEST 1 VIEW COMPARISON:  Chest radiograph dated 11/01/2019. FINDINGS: The cardiac silhouette is obscured, but appears unchanged. Moderate bilateral airspace opacities appear decreased on the right and unchanged on the left. No large pleural effusion is identified. There is no pneumothorax. The osseous structures are unremarkable. IMPRESSION: Moderate bilateral airspace opacities, decreased on the right and unchanged on the left. Electronically Signed  By: Romona Curls M.D.   On: 11/05/2019 08:45   DG Chest Port 1 View  Result Date: 11/01/2019 CLINICAL DATA:  COVID-19 positive with airspace opacity EXAM: PORTABLE CHEST 1 VIEW COMPARISON:  Chest radiograph and chest CT October 30, 2019 FINDINGS: Airspace opacity is again noted throughout much of the right lung with greatest concentration of opacity in the right mid and lower lung regions. Subtle opacity noted in the left mid and lower lung regions. Stable cardiac enlargement. Pulmonary vascularity normal. No adenopathy. No bone lesions.  IMPRESSION: Stable multifocal airspace opacity, consistent with known atypical organism pneumonia. No new areas of opacity evident. Stable cardiac prominence. No adenopathy appreciable. Electronically Signed   By: Bretta Bang III M.D.   On: 11/01/2019 08:26   DG Chest Port 1 View  Result Date: 10/30/2019 CLINICAL DATA:  Shortness of breath.  COVID. EXAM: PORTABLE CHEST 1 VIEW COMPARISON:  10/15/2016 FINDINGS: Multifocal airspace opacities throughout the right greater than left lungs. No visible pleural effusions. No pneumothorax. Cardiac silhouette is accentuated by low lung volumes and portable technique. No acute osseous abnormality. IMPRESSION: Multifocal right greater than left airspace opacities, compatible with multifocal pneumonia and reported history of COVID. Electronically Signed   By: Feliberto Harts MD   On: 10/30/2019 19:36   VAS [place] LOWER EXTREMITY VENOUS (DVT)  Result Date: 11/03/2019  Lower Venous DVTStudy Indications: D-dimer.  Anticoagulation: Lovenox. Comparison Study: No prior studies. Performing Technologist: Jean Rosenthal  Examination Guidelines: A complete evaluation includes B-mode imaging, spectral Doppler, color Doppler, and power Doppler as needed of all accessible portions of each vessel. Bilateral testing is considered an integral part of a complete examination. Limited examinations for reoccurring indications may be performed as noted. The reflux portion of the exam is performed with the patient in reverse Trendelenburg.  +---------+---------------+---------+-----------+----------+--------------+ RIGHT    CompressibilityPhasicitySpontaneityPropertiesThrombus Aging +---------+---------------+---------+-----------+----------+--------------+ CFV      Full           Yes      Yes                                 +---------+---------------+---------+-----------+----------+--------------+ SFJ      Full                                                         +---------+---------------+---------+-----------+----------+--------------+ FV Prox  Full                                                        +---------+---------------+---------+-----------+----------+--------------+ FV Mid   Full                                                        +---------+---------------+---------+-----------+----------+--------------+ FV DistalFull                                                        +---------+---------------+---------+-----------+----------+--------------+  PFV      Full                                                        +---------+---------------+---------+-----------+----------+--------------+ POP      Full           Yes      Yes                                 +---------+---------------+---------+-----------+----------+--------------+ PTV      Full                                                        +---------+---------------+---------+-----------+----------+--------------+ PERO     Full                                                        +---------+---------------+---------+-----------+----------+--------------+   +---------+---------------+---------+-----------+----------+--------------+ LEFT     CompressibilityPhasicitySpontaneityPropertiesThrombus Aging +---------+---------------+---------+-----------+----------+--------------+ CFV      Full           Yes      Yes                                 +---------+---------------+---------+-----------+----------+--------------+ SFJ      Full                                                        +---------+---------------+---------+-----------+----------+--------------+ FV Prox  Full                                                        +---------+---------------+---------+-----------+----------+--------------+ FV Mid   Full                                                         +---------+---------------+---------+-----------+----------+--------------+ FV DistalFull                                                        +---------+---------------+---------+-----------+----------+--------------+ PFV      Full                                                        +---------+---------------+---------+-----------+----------+--------------+  POP      Full           Yes      Yes                                 +---------+---------------+---------+-----------+----------+--------------+ PTV      Full                                                        +---------+---------------+---------+-----------+----------+--------------+ PERO     Full                                                        +---------+---------------+---------+-----------+----------+--------------+     Summary: RIGHT: - There is no evidence of deep vein thrombosis in the lower extremity.  - No cystic structure found in the popliteal fossa.  LEFT: - There is no evidence of deep vein thrombosis in the lower extremity.  - No cystic structure found in the popliteal fossa.  *See table(s) above for measurements and observations. Electronically signed by Coral ElseVance Brabham MD on 11/03/2019 at 7:41:49 PM.    Final

## 2019-11-09 DIAGNOSIS — R7401 Elevation of levels of liver transaminase levels: Secondary | ICD-10-CM | POA: Diagnosis not present

## 2019-11-09 DIAGNOSIS — U071 COVID-19: Secondary | ICD-10-CM | POA: Diagnosis not present

## 2019-11-09 DIAGNOSIS — J1282 Pneumonia due to coronavirus disease 2019: Secondary | ICD-10-CM | POA: Diagnosis not present

## 2019-11-09 LAB — COMPREHENSIVE METABOLIC PANEL
ALT: 95 U/L — ABNORMAL HIGH (ref 0–44)
AST: 85 U/L — ABNORMAL HIGH (ref 15–41)
Albumin: 2.6 g/dL — ABNORMAL LOW (ref 3.5–5.0)
Alkaline Phosphatase: 124 U/L (ref 38–126)
Anion gap: 11 (ref 5–15)
BUN: 17 mg/dL (ref 6–20)
CO2: 28 mmol/L (ref 22–32)
Calcium: 8.8 mg/dL — ABNORMAL LOW (ref 8.9–10.3)
Chloride: 93 mmol/L — ABNORMAL LOW (ref 98–111)
Creatinine, Ser: 1.05 mg/dL (ref 0.61–1.24)
GFR, Estimated: >60 mL/min (ref >60)
Glucose, Bld: 125 mg/dL — ABNORMAL HIGH (ref 70–99)
Potassium: 4.4 mmol/L (ref 3.5–5.1)
Sodium: 132 mmol/L — ABNORMAL LOW (ref 135–145)
Total Bilirubin: 1.1 mg/dL (ref 0.3–1.2)
Total Protein: 7.8 g/dL (ref 6.5–8.1)

## 2019-11-09 LAB — CBC WITH DIFFERENTIAL/PLATELET
Abs Immature Granulocytes: 0.07 10*3/uL (ref 0.00–0.07)
Basophils Absolute: 0 10*3/uL (ref 0.0–0.1)
Basophils Relative: 0 %
Eosinophils Absolute: 0 10*3/uL (ref 0.0–0.5)
Eosinophils Relative: 0 %
HCT: 42.2 % (ref 39.0–52.0)
Hemoglobin: 14.1 g/dL (ref 13.0–17.0)
Immature Granulocytes: 1 %
Lymphocytes Relative: 11 %
Lymphs Abs: 1 10*3/uL (ref 0.7–4.0)
MCH: 32.3 pg (ref 26.0–34.0)
MCHC: 33.4 g/dL (ref 30.0–36.0)
MCV: 96.6 fL (ref 80.0–100.0)
Monocytes Absolute: 0.8 10*3/uL (ref 0.1–1.0)
Monocytes Relative: 9 %
Neutro Abs: 6.9 10*3/uL (ref 1.7–7.7)
Neutrophils Relative %: 79 %
Platelets: 500 10*3/uL — ABNORMAL HIGH (ref 150–400)
RBC: 4.37 MIL/uL (ref 4.22–5.81)
RDW: 11.7 % (ref 11.5–15.5)
WBC: 8.7 10*3/uL (ref 4.0–10.5)
nRBC: 0 % (ref 0.0–0.2)

## 2019-11-09 LAB — GLUCOSE, CAPILLARY
Glucose-Capillary: 111 mg/dL — ABNORMAL HIGH (ref 70–99)
Glucose-Capillary: 252 mg/dL — ABNORMAL HIGH (ref 70–99)
Glucose-Capillary: 120 mg/dL — ABNORMAL HIGH (ref 70–99)
Glucose-Capillary: 164 mg/dL — ABNORMAL HIGH (ref 70–99)

## 2019-11-09 LAB — C-REACTIVE PROTEIN: CRP: 11.8 mg/dL — ABNORMAL HIGH (ref <1.0)

## 2019-11-09 LAB — D-DIMER, QUANTITATIVE: D-Dimer, Quant: 2.7 ug/mL-FEU — ABNORMAL HIGH (ref 0.00–0.50)

## 2019-11-09 NOTE — Progress Notes (Signed)
PROGRESS NOTE                                                                                                                                                                                                             Patient Demographics:    Robertson Colclough, is a 59 y.o. male, DOB - September 02, 1960, ZOX:[id number]  Outpatient Primary MD for the patient is Patient, No Pcp Per    LOS - 9  Admit date - 10/30/2019    Chief Complaint  Patient presents with  . Cough  . Shortness of Breath       Brief Narrative  -  KINGDAVID LEINBACH is a 59 y.o. male with medical history significant of hypertension presented to the ED on 9/29 via EMS for evaluation of shortness of breath and cough.  Oxygen saturation 77% on room air, there he was diagnosed with acute hypoxic respiratory failure due to COVID-19 pneumonia and ARDS.  He was admitted to the hospital and placed on heated high flow.   Subjective:   Patient still reports feeling better, dyspnea on cough has improved, no further epistaxis.   Assessment  & Plan :    Acute Hypoxic Resp. Failure due to Acute Covid 19 Viral Pneumonitis during the ongoing 2020 Covid 19 Pandemic  - he is unfortunately unvaccinated and has incurred severe parenchymal lung injury with ARDS due to COVID-19 pneumonia,  -Continue with IV steroids . -Treated with IV remdesivir . -Treated with baricitinib.  . -Was encouraged to use incentive spirometry, flutter valve, out of bed to chair . -Respiratory status remains very tenuous, but he has less oxygen requirement today, this morning he is down to 20 L via heated high flow, will see if we can wean him to high flow nasal cannula later in the day.   -On Lasix as needed, appears to be euvolemic today, will hold on further Lasix today.  .  Encouraged the patient to sit up in chair in the daytime use I-S and flutter valve for pulmonary toiletry and then prone in bed when  at night.  Will advance activity and titrate down oxygen as possible.    SpO2: 91 % O2 Flow Rate (L/min): 20 L/min FiO2 (%): 90 %   Recent Labs  Lab 11/04/19 0445 11/04/19 0445 11/05/19 0306 11/06/19 0503 11/06/19 0504 11/07/19 0144 11/08/19  0454 11/09/19 0408  WBC 11.6*   < > 13.1* 12.3*  --  11.7* 10.2 8.7  PLT 302   < > 410* 436*  --  470* 470* 500*  CRP 9.1*   < > 11.8* 19.8*  --  20.7* 18.8* 11.8*  BNP 50.9  --  48.3  --  32.4 28.9 34.1  --   DDIMER 4.58*   < > 3.54* 3.39*  --  3.07* 4.20* 2.70*  PROCALCITON 0.14  --  0.24 0.21  --  0.17 0.20  --   AST 48*   < > 46* 38  --  34 32 85*  ALT 60*   < > 62* 56*  --  47* 46* 95*  ALKPHOS 125   < > 126 113  --  108 109 124  BILITOT 1.0   < > 1.1 1.3*  --  1.3* 1.3* 1.1  ALBUMIN 2.8*   < > 2.9* 2.7*  --  2.5* 2.4* 2.6*   < > = values in this interval not displayed.     Asymptomatic transaminitis due to viral infection.  Trending down, continue to monitor  Smoking.  Counseled to quit.  Stress-induced hypertension.  On as needed labetalol.  Monitor.  Risisng D-dimer due to inflammation.  - CTA negative, -ve leg ultrasound.  -Continue with Lovenox 0.5 mg/kg every 12 hours  Mild epistaxis .  Completely resolved, nasal saline spray and Afrin ordered, see #5 above.  Oral thrush -Started on Magic mouthwash   Condition - Extremely Guarded  Family Communication  : [name] - [phone] on 11/01/19, 11/03/19, November 04, 2019, November 05, 2019 ,11/08/2019  Code Status :  Full  Consults  :  None  Procedures  :    CTA - no PE  Leg [place] - No DVT   PUD Prophylaxis : PPI  Disposition Plan  :    Status is: Inpatient  Remains inpatient appropriate because:Hemodynamically unstable and IV treatments appropriate due to intensity of illness or inability to take PO   Dispo: The patient is from: Home              Anticipated d/c is to: Home              Anticipated d/c date is: > 3 days              Patient  currently is not medically stable to d/c.  DVT Prophylaxis  :  Lovenox   Lab Results  Component Value Date   PLT 500 (H) 11/09/2019    Diet :  Diet Order            Diet Heart Room service appropriate? Yes; Fluid consistency: Thin  Diet effective now                  Inpatient Medications  Scheduled Meds: . vitamin C  500 mg Oral Daily  . baricitinib  4 mg Oral Daily  . cholecalciferol  1,000 Units Oral Daily  . enoxaparin (LOVENOX) injection  60 mg Subcutaneous Q12H  . influenza vac split quadrivalent PF  0.5 mL Intramuscular Tomorrow-1000  . insulin aspart  0-15 Units Subcutaneous TID WC  . insulin aspart  0-5 Units Subcutaneous QHS  . magic mouthwash  10 mL Oral QID  . methylPREDNISolone (SOLU-MEDROL) injection  80 mg Intravenous Q12H  . metoprolol tartrate  50 mg Oral BID  . nicotine  14 mg Transdermal Daily  . oxymetazoline  1 spray  Each Nare BID  . pantoprazole  40 mg Oral Daily  . pneumococcal 23 valent vaccine  0.5 mL Intramuscular Tomorrow-1000  . senna-docusate  2 tablet Oral BID  . zinc sulfate  220 mg Oral Daily   Continuous Infusions: . sodium chloride Stopped (10/31/19 1114)   PRN Meds:.sodium chloride, acetaminophen, albuterol, chlorpheniramine-HYDROcodone, guaiFENesin-dextromethorphan, labetalol, lip balm, sodium chloride  Antibiotics  :    Anti-infectives (From admission, onward)   Start     Dose/Rate Route Frequency Ordered Stop   10/31/19 1000  remdesivir 100 mg in sodium chloride 0.9 % 100 mL IVPB        100 mg 200 mL/hr over 30 Minutes Intravenous Daily 10/30/19 1851 11/03/19 1124   10/30/19 1930  remdesivir 100 mg in sodium chloride 0.9 % 100 mL IVPB       "Followed by" Linked Group Details   100 mg 200 mL/hr over 30 Minutes Intravenous  Once 10/30/19 1851 10/30/19 2106   10/30/19 1900  remdesivir 100 mg in sodium chloride 0.9 % 100 mL IVPB       "Followed by" Linked Group Details   100 mg 200 mL/hr over 30 Minutes Intravenous  Once  10/30/19 1851 10/30/19 2106       Mliss Fritz Shaunessy Dobratz M.D on 11/09/2019 at 2:26 PM  To page go to [email or web address]   [facility] -  Office  [phone]     See all Orders from today for further details    Objective:   Vitals:   11/09/19 0720 11/09/19 0735 11/09/19 0916 11/09/19 1136  BP:    123/68  Pulse:      Resp:      Temp:  98 F (36.7 C)  97.8 F (36.6 C)  TempSrc:  Oral  Oral  SpO2: 96%  91%   Weight:      Height:        Wt Readings from Last 3 Encounters:  10/31/19 130.5 kg  12/22/18 124.7 kg  10/15/16 124.7 kg     Intake/Output Summary (Last 24 hours) at 11/09/2019 1426 Last data filed at 11/09/2019 1248 Gross per 24 hour  Intake 720 ml  Output 850 ml  Net -130 ml     Physical Exam  Awake Alert, Oriented X 3, No new F.N deficits, Normal affect Symmetrical Chest wall movement, Good air movement bilaterally, scattered rales RRR,No Gallops,Rubs or new Murmurs, No Parasternal Heave +ve B.Sounds, Abd Soft, No tenderness, No rebound - guarding or rigidity. No Cyanosis, Clubbing or edema, No new Rash or bruise      Data Review:    CBC Recent Labs  Lab 11/05/19 0306 11/06/19 0503 11/07/19 0144 11/08/19 0454 11/09/19 0408  WBC 13.1* 12.3* 11.7* 10.2 8.7  HGB 15.0 15.2 14.5 13.7 14.1  HCT 44.4 44.5 42.8 40.8 42.2  PLT 410* 436* 470* 470* 500*  MCV 95.7 95.9 95.5 95.3 96.6  MCH 32.3 32.8 32.4 32.0 32.3  MCHC 33.8 34.2 33.9 33.6 33.4  RDW 11.9 12.0 11.9 11.8 11.7  LYMPHSABS 1.4 1.4 1.2 1.3 1.0  MONOABS 1.0 1.1* 1.0 0.8 0.8  EOSABS 0.0 0.2 0.2 0.2 0.0  BASOSABS 0.0 0.0 0.0 0.0 0.0    Recent Labs  Lab 11/04/19 0445 11/04/19 0445 11/05/19 0306 11/05/19 1411 11/06/19 0503 11/06/19 0504 11/07/19 0144 11/08/19 0454 11/09/19 0408  NA 135   < > 133*  --  130*  --  132* 131* 132*  K 4.5   < > 4.0  --  4.1  --  4.0 3.8 4.4  CL 99   < > 97*  --  92*  --  94* 92* 93*  CO2 26   < > 22  --  25  --  GLUCOSE 157*   < > 141*  --   134*  --  91 109* 125*  BUN 16   < > 18  --  21*  --  CREATININE 1.04   < > 0.99  --  1.10  --  1.11 1.15 1.05  CALCIUM 8.8*   < > 8.9  --  9.0  --  9.0 9.0 8.8*  AST 48*   < > 46*  --  38  --  34 32 85*  ALT 60*   < > 62*  --  56*  --  47* 46* 95*  ALKPHOS 125   < > 126  --  113  --  108 109 124  BILITOT 1.0   < > 1.1  --  1.3*  --  1.3* 1.3* 1.1  ALBUMIN 2.8*   < > 2.9*  --  2.7*  --  2.5* 2.4* 2.6*  MG 2.2  --  2.1  --  2.3  --  2.3 2.4  --   CRP 9.1*   < > 11.8*  --  19.8*  --  20.7* 18.8* 11.8*  DDIMER 4.58*   < > 3.54*  --  3.39*  --  3.07* 4.20* 2.70*  PROCALCITON 0.14  --  0.24  --  0.21  --  0.17 0.20  --   TSH  --   --   --  1.268  --   --   --   --   --   BNP 50.9  --  48.3  --   --  32.4 28.9 34.1  --    < > = values in this interval not displayed.    ------------------------------------------------------------------------------------------------------------------ No results for input(s): CHOL, HDL, LDLCALC, TRIG, CHOLHDL, LDLDIRECT in the last 72 hours.  Lab Results  Component Value Date   HGBA1C 5.6 11/01/2019   ------------------------------------------------------------------------------------------------------------------ No results for input(s): TSH, T4TOTAL, T3FREE, THYROIDAB in the last 72 hours.  Invalid input(s): FREET3  Cardiac Enzymes No results for input(s): CKMB, TROPONINI, MYOGLOBIN in the last 168 hours.  Invalid input(s): CK ------------------------------------------------------------------------------------------------------------------    Component Value Date/Time   BNP 34.1 11/08/2019 0454    Micro Results Recent Results (from the past 240 hour(s))  Blood Culture (routine x 2)     Status: None   Collection Time: 10/30/19  7:00 PM   Specimen: BLOOD RIGHT HAND  Result Value Ref Range Status   Specimen Description   Final    BLOOD RIGHT HAND Performed at [facility], [address] [place], [place] [address]     Special Requests   Final    BOTTLES DRAWN AEROBIC AND ANAEROBIC Blood Culture adequate volume Performed at [facility], [address], [place], [place] [address]    Culture   Final    NO GROWTH 5 DAYS Performed at [facility], [address] [place], [place] [address]    Report Status 11/04/2019 FINAL  Final  Blood Culture (routine x 2)     Status: None   Collection Time: 10/30/19  7:20 PM   Specimen: BLOOD  Result Value Ref Range Status   Specimen Description   Final    BLOOD RIGHT  ANTECUBITAL Performed at [facility], [address] [place], [place] [address]    Special Requests   Final    BOTTLES DRAWN AEROBIC AND ANAEROBIC Blood Culture results may not be optimal due to an inadequate volume of blood received in culture bottles Performed at [facility] [place], [address] [place], [place] [address]    Culture   Final    NO GROWTH 5 DAYS Performed at [facility], [address] [place], [place] [address]    Report Status 11/04/2019 FINAL  Final    Radiology Reports CT Angio Chest PE W and/or Wo Contrast  Result Date: 10/30/2019 CLINICAL DATA:  COVID-19 positive, worsening shortness of breath at home, O2 sat 77% on room air EXAM: CT ANGIOGRAPHY CHEST WITH CONTRAST TECHNIQUE: Multidetector CT imaging of the chest was performed using the standard protocol during bolus administration of intravenous contrast. Multiplanar CT image reconstructions and MIPs were obtained to evaluate the vascular anatomy. CONTRAST:  100mL OMNIPAQUE IOHEXOL 350 MG/ML SOLN COMPARISON:  Radiograph 10/30/2019 FINDINGS: Cardiovascular: Satisfactory opacification the pulmonary arteries to the segmental level. No pulmonary artery filling defects are identified. Central pulmonary arteries are normal caliber. The aortic root is suboptimally assessed given cardiac pulsation artifact. The aorta is normal caliber. No acute luminal abnormality of the imaged aorta. No  periaortic stranding or hemorrhage. Normal 3 vessel branching of the aortic arch. Proximal great vessels are unremarkable. Normal heart size. No pericardial effusion. No major venous abnormalities. Mediastinum/Nodes: No mediastinal fluid or gas. Normal thyroid gland and thoracic inlet. No acute abnormality of the trachea or esophagus. Scattered borderline enlarged nodes with preserved nodal architecture within the mediastinum and hila for instance a 12 mm left paratracheal node (4/37), 10 mm right paratracheal node (4/37), and an 11 mm right hilar node (4/51) are favored to be reactive. No worrisome enlarged mediastinal, hilar or axillary adenopathy. Lungs/Pleura: Multifocal areas of mixed consolidative and ground-glass opacity with interstitial/septal thickening in a crazy paving pattern which is an often reported imaging feature COVID 19 among other infectious and inflammatory etiologies. No pneumothorax or effusion. Mild diffuse airways thickening. Upper Abdomen: No acute abnormalities present in the visualized portions of the upper abdomen. Musculoskeletal: No acute osseous abnormality or suspicious osseous lesion. Multilevel degenerative changes are present in the imaged portions of the spine. No worrisome chest wall lesions. Review of the MIP images confirms the above findings. IMPRESSION: 1. No evidence of pulmonary embolism. 2. Multifocal areas of mixed consolidative and ground-glass opacity with interstitial/septal thickening compatible with reported imaging features of COVID 19. 3. Scattered borderline enlarged nodes within the mediastinum and hila, favored to be reactive. Electronically Signed   By: Kreg ShropshirePrice  DeHay M.D.   On: 10/30/2019 20:59   DG Chest Port 1 View  Result Date: 11/05/2019 CLINICAL DATA:  Shortness of breath, COVID positive EXAM: PORTABLE CHEST 1 VIEW COMPARISON:  Chest radiograph dated 11/01/2019. FINDINGS: The cardiac silhouette is obscured, but appears unchanged. Moderate bilateral  airspace opacities appear decreased on the right and unchanged on the left. No large pleural effusion is identified. There is no pneumothorax. The osseous structures are unremarkable. IMPRESSION: Moderate bilateral airspace opacities, decreased on the right and unchanged on the left. Electronically Signed   By: Romona Curlsyler  Litton M.D.   On: 11/05/2019 08:45   DG Chest Port 1 View  Result Date: 11/01/2019 CLINICAL DATA:  COVID-19 positive with airspace opacity EXAM: PORTABLE CHEST 1 VIEW COMPARISON:  Chest radiograph and chest CT October 30, 2019 FINDINGS: Airspace opacity is again noted throughout much of the right lung with greatest concentration of opacity in the right mid and lower lung regions. Subtle opacity noted in the left mid and lower lung regions. Stable cardiac enlargement. Pulmonary vascularity normal. No adenopathy. No bone lesions. IMPRESSION: Stable multifocal airspace opacity, consistent with known atypical organism pneumonia. No new areas of opacity evident. Stable cardiac prominence. No adenopathy appreciable. Electronically Signed   By: Bretta Bang III M.D.   On: 11/01/2019 08:26   DG Chest Port 1 View  Result Date: 10/30/2019 CLINICAL DATA:  Shortness of breath.  COVID. EXAM: PORTABLE CHEST 1 VIEW COMPARISON:  10/15/2016 FINDINGS: Multifocal airspace opacities throughout the right greater than left lungs. No visible pleural effusions. No pneumothorax. Cardiac silhouette is accentuated by low lung volumes and portable technique. No acute osseous abnormality. IMPRESSION: Multifocal right greater than left airspace opacities, compatible with multifocal pneumonia and reported history of COVID. Electronically Signed   By: Feliberto Harts MD   On: 10/30/2019 19:36   VAS [place] LOWER EXTREMITY VENOUS (DVT)  Result Date: 11/03/2019  Lower Venous DVTStudy Indications: D-dimer.  Anticoagulation: Lovenox. Comparison Study: No prior studies. Performing Technologist: Jean Rosenthal  Examination  Guidelines: A complete evaluation includes B-mode imaging, spectral Doppler, color Doppler, and power Doppler as needed of all accessible portions of each vessel. Bilateral testing is considered an integral part of a complete examination. Limited examinations for reoccurring indications may be performed as noted. The reflux portion of the exam is performed with the patient in reverse Trendelenburg.  +---------+---------------+---------+-----------+----------+--------------+ RIGHT    CompressibilityPhasicitySpontaneityPropertiesThrombus Aging +---------+---------------+---------+-----------+----------+--------------+ CFV      Full           Yes      Yes                                 +---------+---------------+---------+-----------+----------+--------------+ SFJ      Full                                                        +---------+---------------+---------+-----------+----------+--------------+ FV Prox  Full                                                        +---------+---------------+---------+-----------+----------+--------------+ FV Mid   Full                                                        +---------+---------------+---------+-----------+----------+--------------+ FV DistalFull                                                        +---------+---------------+---------+-----------+----------+--------------+ PFV      Full                                                        +---------+---------------+---------+-----------+----------+--------------+  POP      Full           Yes      Yes                                 +---------+---------------+---------+-----------+----------+--------------+ PTV      Full                                                        +---------+---------------+---------+-----------+----------+--------------+ PERO     Full                                                         +---------+---------------+---------+-----------+----------+--------------+   +---------+---------------+---------+-----------+----------+--------------+ LEFT     CompressibilityPhasicitySpontaneityPropertiesThrombus Aging +---------+---------------+---------+-----------+----------+--------------+ CFV      Full           Yes      Yes                                 +---------+---------------+---------+-----------+----------+--------------+ SFJ      Full                                                        +---------+---------------+---------+-----------+----------+--------------+ FV Prox  Full                                                        +---------+---------------+---------+-----------+----------+--------------+ FV Mid   Full                                                        +---------+---------------+---------+-----------+----------+--------------+ FV DistalFull                                                        +---------+---------------+---------+-----------+----------+--------------+ PFV      Full                                                        +---------+---------------+---------+-----------+----------+--------------+ POP      Full           Yes      Yes                                 +---------+---------------+---------+-----------+----------+--------------+  PTV      Full                                                        +---------+---------------+---------+-----------+----------+--------------+ PERO     Full                                                        +---------+---------------+---------+-----------+----------+--------------+     Summary: RIGHT: - There is no evidence of deep vein thrombosis in the lower extremity.  - No cystic structure found in the popliteal fossa.  LEFT: - There is no evidence of deep vein thrombosis in the lower extremity.  - No cystic structure found in the popliteal fossa.   *See table(s) above for measurements and observations. Electronically signed by Coral Else MD on 11/03/2019 at 7:41:49 PM.    Final

## 2019-11-10 DIAGNOSIS — U071 COVID-19: Secondary | ICD-10-CM | POA: Diagnosis not present

## 2019-11-10 DIAGNOSIS — J1282 Pneumonia due to coronavirus disease 2019: Secondary | ICD-10-CM | POA: Diagnosis not present

## 2019-11-10 LAB — COMPREHENSIVE METABOLIC PANEL
ALT: 91 U/L — ABNORMAL HIGH (ref 0–44)
AST: 42 U/L — ABNORMAL HIGH (ref 15–41)
Albumin: 2.5 g/dL — ABNORMAL LOW (ref 3.5–5.0)
Alkaline Phosphatase: 110 U/L (ref 38–126)
Anion gap: 6 (ref 5–15)
BUN: 15 mg/dL (ref 6–20)
CO2: 32 mmol/L (ref 22–32)
Calcium: 9 mg/dL (ref 8.9–10.3)
Chloride: 94 mmol/L — ABNORMAL LOW (ref 98–111)
Creatinine, Ser: 1.14 mg/dL (ref 0.61–1.24)
GFR, Estimated: >60 mL/min (ref >60)
Glucose, Bld: 178 mg/dL — ABNORMAL HIGH (ref 70–99)
Potassium: 4.5 mmol/L (ref 3.5–5.1)
Sodium: 132 mmol/L — ABNORMAL LOW (ref 135–145)
Total Bilirubin: 0.4 mg/dL (ref 0.3–1.2)
Total Protein: 7.6 g/dL (ref 6.5–8.1)

## 2019-11-10 LAB — CBC
HCT: 41.3 % (ref 39.0–52.0)
Hemoglobin: 13.7 g/dL (ref 13.0–17.0)
MCH: 32.5 pg (ref 26.0–34.0)
MCHC: 33.2 g/dL (ref 30.0–36.0)
MCV: 98.1 fL (ref 80.0–100.0)
Platelets: 498 10*3/uL — ABNORMAL HIGH (ref 150–400)
RBC: 4.21 MIL/uL — ABNORMAL LOW (ref 4.22–5.81)
RDW: 11.9 % (ref 11.5–15.5)
WBC: 10.2 10*3/uL (ref 4.0–10.5)
nRBC: 0 % (ref 0.0–0.2)

## 2019-11-10 LAB — GLUCOSE, CAPILLARY
Glucose-Capillary: 97 mg/dL (ref 70–99)
Glucose-Capillary: 182 mg/dL — ABNORMAL HIGH (ref 70–99)
Glucose-Capillary: 157 mg/dL — ABNORMAL HIGH (ref 70–99)
Glucose-Capillary: 149 mg/dL — ABNORMAL HIGH (ref 70–99)

## 2019-11-10 LAB — D-DIMER, QUANTITATIVE: D-Dimer, Quant: 2.1 ug/mL-FEU — ABNORMAL HIGH (ref 0.00–0.50)

## 2019-11-10 MED ORDER — FUROSEMIDE 10 MG/ML IJ SOLN
40.0000 mg | Freq: Once | INTRAMUSCULAR | Status: AC
  Administered 2019-11-10: 40 mg via INTRAVENOUS
  Filled 2019-11-10: qty 4

## 2019-11-10 NOTE — Progress Notes (Signed)
PROGRESS NOTE                                                                                                                                                                                                             Patient Demographics:    Brandon Snow, is a 59 y.o. male, DOB - 05-Jul-1960, ONG:[id number]  Outpatient Primary MD for the patient is Patient, No Pcp Per    LOS - 10  Admit date - 10/30/2019    Chief Complaint  Patient presents with  . Cough  . Shortness of Breath       Brief Narrative  -  Brandon Snow is a 59 y.o. male with medical history significant of hypertension presented to the ED on 9/29 via EMS for evaluation of shortness of breath and cough.  Oxygen saturation 77% on room air, there he was diagnosed with acute hypoxic respiratory failure due to COVID-19 pneumonia and ARDS.  He was admitted to the hospital and placed on heated high flow.   Subjective:   Dyspnea has improved, cough has improved as well, no chest pain, nausea or vomiting   Assessment  & Plan :    Acute Hypoxic Resp. Failure due to Acute Covid 19 Viral Pneumonitis during the ongoing 2020 Covid 19 Pandemic  - he is unfortunately unvaccinated and has incurred severe parenchymal lung injury with ARDS due to COVID-19 pneumonia,  -Continue with IV steroids . -Treated with IV remdesivir . -Treated with baricitinib.  . -Was encouraged to use incentive spirometry, flutter valve, out of bed to chair . -Respiratory status remains very tenuous, but he has less oxygen requirement today, he was able to transition from heated high flow to high flow nasal cannula, he is on 15 L today. -On Lasix as needed, give 40 mg of IV Lasix today..  .  Encouraged the patient to sit up in chair in the daytime use I-S and flutter valve for pulmonary toiletry and then prone in bed when at night.  Will advance activity and titrate down oxygen as possible.      SpO2: 92 % O2 Flow Rate (L/min): 15 L/min FiO2 (%): 90 %   Recent Labs  Lab 11/04/19 0445 11/04/19 0445 11/05/19 0306 11/05/19 0306 11/06/19 0503 11/06/19 0504 11/07/19 0144 11/08/19 0454 11/09/19 0408 11/10/19 0500  WBC 11.6*   < >  13.1*   < > 12.3*  --  11.7* 10.2 8.7 10.2  PLT 302   < > 410*   < > 436*  --  470* 470* 500* 498*  CRP 9.1*   < > 11.8*  --  19.8*  --  20.7* 18.8* 11.8*  --   BNP 50.9  --  48.3  --   --  32.4 28.9 34.1  --   --   DDIMER 4.58*   < > 3.54*   < > 3.39*  --  3.07* 4.20* 2.70* 2.10*  PROCALCITON 0.14  --  0.24  --  0.21  --  0.17 0.20  --   --   AST 48*   < > 46*   < > 38  --  34 32 85* 42*  ALT 60*   < > 62*   < > 56*  --  47* 46* 95* 91*  ALKPHOS 125   < > 126   < > 113  --  108 109 124 110  BILITOT 1.0   < > 1.1   < > 1.3*  --  1.3* 1.3* 1.1 0.4  ALBUMIN 2.8*   < > 2.9*   < > 2.7*  --  2.5* 2.4* 2.6* 2.5*   < > = values in this interval not displayed.     Asymptomatic transaminitis due to viral infection.  Trending down, continue to monitor  Smoking.  Counseled to quit.  Stress-induced hypertension.  On as needed labetalol.  Monitor.  Risisng D-dimer due to inflammation.  - CTA negative, -ve leg ultrasound.  -Continue with Lovenox 0.5 mg/kg every 12 hours  Mild epistaxis .  Completely resolved, nasal saline spray and Afrin ordered, see #5 above.  Oral thrush -Started on Magic mouthwash   Condition - Extremely Guarded  Family Communication  : [name] - [phone] on 11/01/19, 11/03/19, November 04, 2019, November 05, 2019 ,11/08/2019  Code Status :  Full  Consults  :  None  Procedures  :    CTA - no PE  Leg [place] - No DVT   PUD Prophylaxis : PPI  Disposition Plan  :    Status is: Inpatient  Remains inpatient appropriate because:Hemodynamically unstable and IV treatments appropriate due to intensity of illness or inability to take PO   Dispo: The patient is from: Home              Anticipated d/c is to: Home               Anticipated d/c date is: > 3 days              Patient currently is not medically stable to d/c.  DVT Prophylaxis  :  Lovenox   Lab Results  Component Value Date   PLT 498 (H) 11/10/2019    Diet :  Diet Order            Diet Heart Room service appropriate? Yes; Fluid consistency: Thin  Diet effective now                  Inpatient Medications  Scheduled Meds: . vitamin C  500 mg Oral Daily  . baricitinib  4 mg Oral Daily  . cholecalciferol  1,000 Units Oral Daily  . enoxaparin (LOVENOX) injection  60 mg Subcutaneous Q12H  . influenza vac split quadrivalent PF  0.5 mL Intramuscular Tomorrow-1000  . insulin aspart  0-15 Units Subcutaneous TID WC  . insulin  aspart  0-5 Units Subcutaneous QHS  . magic mouthwash  10 mL Oral QID  . methylPREDNISolone (SOLU-MEDROL) injection  80 mg Intravenous Q12H  . metoprolol tartrate  50 mg Oral BID  . nicotine  14 mg Transdermal Daily  . oxymetazoline  1 spray Each Nare BID  . pantoprazole  40 mg Oral Daily  . pneumococcal 23 valent vaccine  0.5 mL Intramuscular Tomorrow-1000  . senna-docusate  2 tablet Oral BID  . zinc sulfate  220 mg Oral Daily   Continuous Infusions: . sodium chloride Stopped (10/31/19 1114)   PRN Meds:.sodium chloride, acetaminophen, albuterol, chlorpheniramine-HYDROcodone, guaiFENesin-dextromethorphan, labetalol, lip balm, sodium chloride  Antibiotics  :    Anti-infectives (From admission, onward)   Start     Dose/Rate Route Frequency Ordered Stop   10/31/19 1000  remdesivir 100 mg in sodium chloride 0.9 % 100 mL IVPB        100 mg 200 mL/hr over 30 Minutes Intravenous Daily 10/30/19 1851 11/03/19 1124   10/30/19 1930  remdesivir 100 mg in sodium chloride 0.9 % 100 mL IVPB       "Followed by" Linked Group Details   100 mg 200 mL/hr over 30 Minutes Intravenous  Once 10/30/19 1851 10/30/19 2106   10/30/19 1900  remdesivir 100 mg in sodium chloride 0.9 % 100 mL IVPB       "Followed by" Linked Group  Details   100 mg 200 mL/hr over 30 Minutes Intravenous  Once 10/30/19 1851 10/30/19 2106       Mliss Fritz Miana Politte M.D on 11/10/2019 at 1:28 PM  To page go to [email or web address]   [facility] -  Office  [phone]     See all Orders from today for further details    Objective:   Vitals:   11/10/19 0022 11/10/19 0341 11/10/19 0810 11/10/19 1235  BP: 122/77 117/74 118/75 108/75  Pulse: 77 79 99 85  Resp: Temp: 98.5 F (36.9 C) 98.3 F (36.8 C) 98.3 F (36.8 C) (!) 97.4 F (36.3 C)  TempSrc: Oral Oral Oral Oral  SpO2: 93% 96% (!) 87% 92%  Weight:      Height:        Wt Readings from Last 3 Encounters:  10/31/19 130.5 kg  12/22/18 124.7 kg  10/15/16 124.7 kg     Intake/Output Summary (Last 24 hours) at 11/10/2019 1328 Last data filed at 11/10/2019 1239 Gross per 24 hour  Intake --  Output 1775 ml  Net -1775 ml     Physical Exam  Awake Alert, Oriented X 3, No new F.N deficits, Normal affect Symmetrical Chest wall movement, Good air movement bilaterally, scattered rales RRR,No Gallops,Rubs or new Murmurs, No Parasternal Heave +ve B.Sounds, Abd Soft, No tenderness, No rebound - guarding or rigidity. No Cyanosis, Clubbing or edema, No new Rash or bruise       Data Review:    CBC Recent Labs  Lab 11/05/19 0306 11/05/19 0306 11/06/19 0503 11/07/19 0144 11/08/19 0454 11/09/19 0408 11/10/19 0500  WBC 13.1*   < > 12.3* 11.7* 10.2 8.7 10.2  HGB 15.0   < > 15.2 14.5 13.7 14.1 13.7  HCT 44.4   < > 44.5 42.8 40.8 42.2 41.3  PLT 410*   < > 436* 470* 470* 500* 498*  MCV 95.7   < > 95.9 95.5 95.3 96.6 98.1  MCH 32.3   < > 32.8 32.4 32.0 32.3 32.5  MCHC 33.8   < > 34.2 33.9  33.6 33.4 33.2  RDW 11.9   < > 12.0 11.9 11.8 11.7 11.9  LYMPHSABS 1.4  --  1.4 1.2 1.3 1.0  --   MONOABS 1.0  --  1.1* 1.0 0.8 0.8  --   EOSABS 0.0  --  0.2 0.2 0.2 0.0  --   BASOSABS 0.0  --  0.0 0.0 0.0 0.0  --    < > = values in this interval not displayed.     Recent Labs  Lab 11/04/19 0445 11/04/19 0445 11/05/19 0306 11/05/19 0306 11/05/19 1411 11/06/19 0503 11/06/19 0504 11/07/19 0144 11/08/19 0454 11/09/19 0408 11/10/19 0500  NA 135   < > 133*   < >  --  130*  --  132* 131* 132* 132*  K 4.5   < > 4.0   < >  --  4.1  --  4.0 3.8 4.4 4.5  CL 99   < > 97*   < >  --  92*  --  94* 92* 93* 94*  CO2 26   < > 22   < >  --  25  --  24 28 28  32  GLUCOSE 157*   < > 141*   < >  --  134*  --  91 109* 125* 178*  BUN 16   < > 18   < >  --  21*  --  20 20 17 15   CREATININE 1.04   < > 0.99   < >  --  1.10  --  1.11 1.15 1.05 1.14  CALCIUM 8.8*   < > 8.9   < >  --  9.0  --  9.0 9.0 8.8* 9.0  AST 48*   < > 46*   < >  --  38  --  34 32 85* 42*  ALT 60*   < > 62*   < >  --  56*  --  47* 46* 95* 91*  ALKPHOS 125   < > 126   < >  --  113  --  108 109 124 110  BILITOT 1.0   < > 1.1   < >  --  1.3*  --  1.3* 1.3* 1.1 0.4  ALBUMIN 2.8*   < > 2.9*   < >  --  2.7*  --  2.5* 2.4* 2.6* 2.5*  MG 2.2  --  2.1  --   --  2.3  --  2.3 2.4  --   --   CRP 9.1*   < > 11.8*  --   --  19.8*  --  20.7* 18.8* 11.8*  --   DDIMER 4.58*   < > 3.54*   < >  --  3.39*  --  3.07* 4.20* 2.70* 2.10*  PROCALCITON 0.14  --  0.24  --   --  0.21  --  0.17 0.20  --   --   TSH  --   --   --   --  1.268  --   --   --   --   --   --   BNP 50.9  --  48.3  --   --   --  32.4 28.9 34.1  --   --    < > = values in this interval not displayed.    ------------------------------------------------------------------------------------------------------------------ No results for input(s): CHOL, HDL, LDLCALC, TRIG, CHOLHDL, LDLDIRECT in the last 72 hours.  Lab Results  Component Value Date  HGBA1C 5.6 11/01/2019   ------------------------------------------------------------------------------------------------------------------ No results for input(s): TSH, T4TOTAL, T3FREE, THYROIDAB in the last 72 hours.  Invalid input(s): FREET3  Cardiac Enzymes No results for input(s): CKMB,  TROPONINI, MYOGLOBIN in the last 168 hours.  Invalid input(s): CK ------------------------------------------------------------------------------------------------------------------    Component Value Date/Time   BNP 34.1 11/08/2019 0454    Micro Results No results found for this or any previous visit (from the past 240 hour(s)).  Radiology Reports CT Angio Chest PE W and/or Wo Contrast  Result Date: 10/30/2019 CLINICAL DATA:  COVID-19 positive, worsening shortness of breath at home, O2 sat 77% on room air EXAM: CT ANGIOGRAPHY CHEST WITH CONTRAST TECHNIQUE: Multidetector CT imaging of the chest was performed using the standard protocol during bolus administration of intravenous contrast. Multiplanar CT image reconstructions and MIPs were obtained to evaluate the vascular anatomy. CONTRAST:  OMNIPAQUE IOHEXOL 350 MG/ML SOLN COMPARISON:  Radiograph 10/30/2019 FINDINGS: Cardiovascular: Satisfactory opacification the pulmonary arteries to the segmental level. No pulmonary artery filling defects are identified. Central pulmonary arteries are normal caliber. The aortic root is suboptimally assessed given cardiac pulsation artifact. The aorta is normal caliber. No acute luminal abnormality of the imaged aorta. No periaortic stranding or hemorrhage. Normal 3 vessel branching of the aortic arch. Proximal great vessels are unremarkable. Normal heart size. No pericardial effusion. No major venous abnormalities. Mediastinum/Nodes: No mediastinal fluid or gas. Normal thyroid gland and thoracic inlet. No acute abnormality of the trachea or esophagus. Scattered borderline enlarged nodes with preserved nodal architecture within the mediastinum and hila for instance a 12 mm left paratracheal node (4/37), 10 mm right paratracheal node (4/37), and an 11 mm right hilar node (4/51) are favored to be reactive. No worrisome enlarged mediastinal, hilar or axillary adenopathy. Lungs/Pleura: Multifocal areas of mixed  consolidative and ground-glass opacity with interstitial/septal thickening in a crazy paving pattern which is an often reported imaging feature COVID 19 among other infectious and inflammatory etiologies. No pneumothorax or effusion. Mild diffuse airways thickening. Upper Abdomen: No acute abnormalities present in the visualized portions of the upper abdomen. Musculoskeletal: No acute osseous abnormality or suspicious osseous lesion. Multilevel degenerative changes are present in the imaged portions of the spine. No worrisome chest wall lesions. Review of the MIP images confirms the above findings. IMPRESSION: 1. No evidence of pulmonary embolism. 2. Multifocal areas of mixed consolidative and ground-glass opacity with interstitial/septal thickening compatible with reported imaging features of COVID 19. 3. Scattered borderline enlarged nodes within the mediastinum and hila, favored to be reactive. Electronically Signed   By: Kreg Shropshire M.D.   On: 10/30/2019 20:59   DG Chest Port 1 View  Result Date: 11/05/2019 CLINICAL DATA:  Shortness of breath, COVID positive EXAM: PORTABLE CHEST 1 VIEW COMPARISON:  Chest radiograph dated 11/01/2019. FINDINGS: The cardiac silhouette is obscured, but appears unchanged. Moderate bilateral airspace opacities appear decreased on the right and unchanged on the left. No large pleural effusion is identified. There is no pneumothorax. The osseous structures are unremarkable. IMPRESSION: Moderate bilateral airspace opacities, decreased on the right and unchanged on the left. Electronically Signed   By: Romona Curls M.D.   On: 11/05/2019 08:45   DG Chest Port 1 View  Result Date: 11/01/2019 CLINICAL DATA:  COVID-19 positive with airspace opacity EXAM: PORTABLE CHEST 1 VIEW COMPARISON:  Chest radiograph and chest CT October 30, 2019 FINDINGS: Airspace opacity is again noted throughout much of the right lung with greatest concentration of opacity in the right mid and lower lung  regions. Subtle opacity noted in the left mid and lower lung regions. Stable cardiac enlargement. Pulmonary vascularity normal. No adenopathy. No bone lesions. IMPRESSION: Stable multifocal airspace opacity, consistent with known atypical organism pneumonia. No new areas of opacity evident. Stable cardiac prominence. No adenopathy appreciable. Electronically Signed   By: Bretta Bang III M.D.   On: 11/01/2019 08:26   DG Chest Port 1 View  Result Date: 10/30/2019 CLINICAL DATA:  Shortness of breath.  COVID. EXAM: PORTABLE CHEST 1 VIEW COMPARISON:  10/15/2016 FINDINGS: Multifocal airspace opacities throughout the right greater than left lungs. No visible pleural effusions. No pneumothorax. Cardiac silhouette is accentuated by low lung volumes and portable technique. No acute osseous abnormality. IMPRESSION: Multifocal right greater than left airspace opacities, compatible with multifocal pneumonia and reported history of COVID. Electronically Signed   By: Feliberto Harts MD   On: 10/30/2019 19:36   VAS [place] LOWER EXTREMITY VENOUS (DVT)  Result Date: 11/03/2019  Lower Venous DVTStudy Indications: D-dimer.  Anticoagulation: Lovenox. Comparison Study: No prior studies. Performing Technologist: Jean Rosenthal  Examination Guidelines: A complete evaluation includes B-mode imaging, spectral Doppler, color Doppler, and power Doppler as needed of all accessible portions of each vessel. Bilateral testing is considered an integral part of a complete examination. Limited examinations for reoccurring indications may be performed as noted. The reflux portion of the exam is performed with the patient in reverse Trendelenburg.  +---------+---------------+---------+-----------+----------+--------------+ RIGHT    CompressibilityPhasicitySpontaneityPropertiesThrombus Aging +---------+---------------+---------+-----------+----------+--------------+ CFV      Full           Yes      Yes                                  +---------+---------------+---------+-----------+----------+--------------+ SFJ      Full                                                        +---------+---------------+---------+-----------+----------+--------------+ FV Prox  Full                                                        +---------+---------------+---------+-----------+----------+--------------+ FV Mid   Full                                                        +---------+---------------+---------+-----------+----------+--------------+ FV DistalFull                                                        +---------+---------------+---------+-----------+----------+--------------+ PFV      Full                                                        +---------+---------------+---------+-----------+----------+--------------+  POP      Full           Yes      Yes                                 +---------+---------------+---------+-----------+----------+--------------+ PTV      Full                                                        +---------+---------------+---------+-----------+----------+--------------+ PERO     Full                                                        +---------+---------------+---------+-----------+----------+--------------+   +---------+---------------+---------+-----------+----------+--------------+ LEFT     CompressibilityPhasicitySpontaneityPropertiesThrombus Aging +---------+---------------+---------+-----------+----------+--------------+ CFV      Full           Yes      Yes                                 +---------+---------------+---------+-----------+----------+--------------+ SFJ      Full                                                        +---------+---------------+---------+-----------+----------+--------------+ FV Prox  Full                                                         +---------+---------------+---------+-----------+----------+--------------+ FV Mid   Full                                                        +---------+---------------+---------+-----------+----------+--------------+ FV DistalFull                                                        +---------+---------------+---------+-----------+----------+--------------+ PFV      Full                                                        +---------+---------------+---------+-----------+----------+--------------+ POP      Full           Yes      Yes                                 +---------+---------------+---------+-----------+----------+--------------+  PTV      Full                                                        +---------+---------------+---------+-----------+----------+--------------+ PERO     Full                                                        +---------+---------------+---------+-----------+----------+--------------+     Summary: RIGHT: - There is no evidence of deep vein thrombosis in the lower extremity.  - No cystic structure found in the popliteal fossa.  LEFT: - There is no evidence of deep vein thrombosis in the lower extremity.  - No cystic structure found in the popliteal fossa.  *See table(s) above for measurements and observations. Electronically signed by Coral Else MD on 11/03/2019 at 7:41:49 PM.    Final

## 2019-11-11 DIAGNOSIS — J9601 Acute respiratory failure with hypoxia: Secondary | ICD-10-CM | POA: Diagnosis not present

## 2019-11-11 DIAGNOSIS — U071 COVID-19: Secondary | ICD-10-CM | POA: Diagnosis not present

## 2019-11-11 DIAGNOSIS — J1282 Pneumonia due to coronavirus disease 2019: Secondary | ICD-10-CM | POA: Diagnosis not present

## 2019-11-11 LAB — COMPREHENSIVE METABOLIC PANEL
ALT: 87 U/L — ABNORMAL HIGH (ref 0–44)
AST: 37 U/L (ref 15–41)
Albumin: 2.7 g/dL — ABNORMAL LOW (ref 3.5–5.0)
Alkaline Phosphatase: 117 U/L (ref 38–126)
Anion gap: 11 (ref 5–15)
BUN: 18 mg/dL (ref 6–20)
CO2: 30 mmol/L (ref 22–32)
Calcium: 9.1 mg/dL (ref 8.9–10.3)
Chloride: 92 mmol/L — ABNORMAL LOW (ref 98–111)
Creatinine, Ser: 1.12 mg/dL (ref 0.61–1.24)
GFR, Estimated: >60 mL/min (ref >60)
Glucose, Bld: 128 mg/dL — ABNORMAL HIGH (ref 70–99)
Potassium: 4.4 mmol/L (ref 3.5–5.1)
Sodium: 133 mmol/L — ABNORMAL LOW (ref 135–145)
Total Bilirubin: 0.5 mg/dL (ref 0.3–1.2)
Total Protein: 7.8 g/dL (ref 6.5–8.1)

## 2019-11-11 LAB — CBC
HCT: 40.7 % (ref 39.0–52.0)
Hemoglobin: 13.2 g/dL (ref 13.0–17.0)
MCH: 31.4 pg (ref 26.0–34.0)
MCHC: 32.4 g/dL (ref 30.0–36.0)
MCV: 96.9 fL (ref 80.0–100.0)
Platelets: 504 10*3/uL — ABNORMAL HIGH (ref 150–400)
RBC: 4.2 MIL/uL — ABNORMAL LOW (ref 4.22–5.81)
RDW: 11.7 % (ref 11.5–15.5)
WBC: 9.8 10*3/uL (ref 4.0–10.5)
nRBC: 0 % (ref 0.0–0.2)

## 2019-11-11 LAB — GLUCOSE, CAPILLARY
Glucose-Capillary: 192 mg/dL — ABNORMAL HIGH (ref 70–99)
Glucose-Capillary: 168 mg/dL — ABNORMAL HIGH (ref 70–99)
Glucose-Capillary: 189 mg/dL — ABNORMAL HIGH (ref 70–99)
Glucose-Capillary: 180 mg/dL — ABNORMAL HIGH (ref 70–99)

## 2019-11-11 LAB — D-DIMER, QUANTITATIVE: D-Dimer, Quant: 1.54 ug/mL-FEU — ABNORMAL HIGH (ref 0.00–0.50)

## 2019-11-11 MED ORDER — SALINE SPRAY 0.65 % NA SOLN: 1.0000 | NASAL | Status: DC | PRN

## 2019-11-11 MED ORDER — FUROSEMIDE 10 MG/ML IJ SOLN
40.0000 mg | Freq: Once | INTRAMUSCULAR | Status: AC
  Administered 2019-11-11: 40 mg via INTRAVENOUS
  Filled 2019-11-11: qty 4

## 2019-11-11 MED ORDER — MAGNESIUM HYDROXIDE 400 MG/5ML PO SUSP
30.0000 mL | Freq: Once | ORAL | Status: AC
  Administered 2019-11-11: 30 mL via ORAL
  Filled 2019-11-11: qty 30

## 2019-11-11 MED ORDER — FUROSEMIDE 10 MG/ML IJ SOLN
INTRAMUSCULAR | Status: AC
  Filled 2019-11-11: qty 4

## 2019-11-11 MED ORDER — BISACODYL 5 MG PO TBEC
5.0000 mg | DELAYED_RELEASE_TABLET | Freq: Once | ORAL | Status: AC
  Administered 2019-11-11: 5 mg via ORAL
  Filled 2019-11-11: qty 1

## 2019-11-11 NOTE — Progress Notes (Signed)
Occupational Therapy Treatment Patient Details Name: Brandon Snow MRN: [id number] DOB: 12-19-1960 Today's Date: 11/11/2019    History of present illness 59yo male c/o SOB and cough. Hypoxic to 77% in the ED on RA. PE negative. Covid positive. Unvaccinated.  PMH HTN   OT comments  Pt making excellent progress towards OT goals during session. Pt engaged throughout with questions about O2 use at home, medical complexities and implementation of energy conservation strategies during daily tasks. Pt received on 15 L HFNC, sustained >92% during mobility to/from door in room without AD. Based on SpO2 readings during mobility, titrated down to 8 L HFNC with >92% sustained at rest. HR <105bpm during session. Plan to further wean O2 during ADLs/mobility and increase endurance. Provided UE HEP handout with initial education to improve overall strength.    Follow Up Recommendations  No OT follow up;Supervision/Assistance - 24 hour    Equipment Recommendations  Tub/shower seat    Recommendations for Other Services      Precautions / Restrictions Precautions Precautions: Other (comment) Precaution Comments: watch sats Restrictions Weight Bearing Restrictions: No       Mobility Bed Mobility               General bed mobility comments: up in recliner on entry  Transfers Overall transfer level: Needs assistance Equipment used: None Transfers: Sit to/from Stand Sit to Stand: Independent         General transfer comment: Independent for sit to stand at recliner without AD, no assist needed    Balance Overall balance assessment: Needs assistance Sitting-balance support: Feet supported;No upper extremity supported Sitting balance-Leahy Scale: Good     Standing balance support: During functional activity;No upper extremity supported Standing balance-Leahy Scale: Good Standing balance comment: slow pacing, reaching out for support at times                            ADL either performed or assessed with clinical judgement   ADL Overall ADL's : Needs assistance/impaired     Grooming: Supervision/safety;Standing Grooming Details (indicate cue type and reason): Supervision standing at sink checking out facial hair                 Toilet Transfer: Supervision/safety;Ambulation Toilet Transfer Details (indicate cue type and reason): Simulated in room without AD         Functional mobility during ADLs: Supervision/safety General ADL Comments: Educated on energy conservation strategies for bathing/dressing     Vision   Vision Assessment?: No apparent visual deficits   Perception     Praxis      Cognition Arousal/Alertness: Awake/alert Behavior During Therapy: WFL for tasks assessed/performed Overall Cognitive Status: Within Functional Limits for tasks assessed                                 General Comments: very motivated        Exercises Other Exercises Other Exercises: Demonstration of UE HEP exercises with orange theraband   Shoulder Instructions       General Comments Pt received on 15 L HFNC, sats >95% at rest and >92% during mobility. Slowly titrated down at rest to 8 L HFNC, sats remaining >92% and probably could be reduced further. HR <105bpm during session. Pt with a lot of questions about his medical conditions, use of O2 at home.     Pertinent Vitals/ Pain  Pain Assessment: No/denies pain  Home Living                                          Prior Functioning/Environment              Frequency  Min 2X/week        Progress Toward Goals  OT Goals(current goals can now be found in the care plan section)  Progress towards OT goals: Progressing toward goals  Acute Rehab OT Goals Patient Stated Goal: home OT Goal Formulation: With patient Time For Goal Achievement: 11/17/19 Potential to Achieve Goals: Good ADL Goals Pt Will Perform Grooming: with  supervision;standing Pt Will Perform Upper Body Bathing: with set-up;sitting Pt Will Perform Lower Body Bathing: with supervision;sit to/from stand Pt Will Perform Upper Body Dressing: with set-up;sitting Pt Will Perform Lower Body Dressing: with supervision;sit to/from stand Pt Will Transfer to Toilet: with supervision;ambulating;regular height toilet;bedside commode;grab bars Pt Will Perform Toileting - Clothing Manipulation and hygiene: with supervision;sit to/from stand Pt Will Perform Tub/Shower Transfer: Tub transfer;with supervision;ambulating;shower seat;tub bench Pt/caregiver will Perform Home Exercise Program: Increased strength;Right Upper extremity;Left upper extremity;With theraband;Independently Additional ADL Goal #1: Pt will tolerate 25 mins therapeutic activity with no more than 4 rest breaks and DOE no >3/4 Additional ADL Goal #2: Pt will independently incorporate 3 energy conservation techniques into daily activities  Plan Discharge plan remains appropriate    Co-evaluation                 AM-PAC OT "6 Clicks" Daily Activity     Outcome Measure   Help from another person eating meals?: None Help from another person taking care of personal grooming?: A Little Help from another person toileting, which includes using toliet, bedpan, or urinal?: A Little Help from another person bathing (including washing, rinsing, drying)?: A Lot Help from another person to put on and taking off regular upper body clothing?: A Little Help from another person to put on and taking off regular lower body clothing?: A Little 6 Click Score: 18    End of Session Equipment Utilized During Treatment: Oxygen  OT Visit Diagnosis: Unsteadiness on feet (R26.81)   Activity Tolerance Patient tolerated treatment well   Patient Left in chair;with call bell/phone within reach   Nurse Communication Mobility status;Other (comment) (O2)        Time: 0102-7253 OT Time Calculation (min): 45  min  Charges: OT General Charges $OT Visit: 1 Visit OT Treatments $Self Care/Home Management : 8-22 mins $Therapeutic Activity: 23-37 mins  Lorre Munroe, OTR/L   Lorre Munroe 11/11/2019, 3:17 PM

## 2019-11-11 NOTE — Progress Notes (Signed)
PROGRESS NOTE                                                                                                                                                                                                             Patient Demographics:    Brandon Snow, is a 59 y.o. male, DOB - 1960/10/11, WJX:[id number]  Outpatient Primary MD for the patient is Patient, No Pcp Per    LOS - 11  Admit date - 10/30/2019    Chief Complaint  Patient presents with  . Cough  . Shortness of Breath       Brief Narrative  -  Brandon Snow is a 59 y.o. male with medical history significant of hypertension presented to the ED on 9/29 via EMS for evaluation of shortness of breath and cough.  Oxygen saturation 77% on room air, there he was diagnosed with acute hypoxic respiratory failure due to COVID-19 pneumonia and ARDS.  He was admitted to the hospital and placed on heated high flow.   Subjective:   Reports his dyspnea still present, worsened with activity, denies any chest pain, no nausea or vomiting.. Reports some constipation   Assessment  & Plan :    Acute Hypoxic Resp. Failure due to Acute Covid 19 Viral Pneumonitis during the ongoing 2020 Covid 19 Pandemic  - he is unfortunately unvaccinated and has incurred severe parenchymal lung injury with ARDS due to COVID-19 pneumonia,  -Continue with IV steroids . -Treated with IV remdesivir . -Treated with baricitinib.  . -Was encouraged to use incentive spirometry, flutter valve, out of bed to chair . -Respiratory status remains very tenuous, he is currently off heated high flow nasal cannula, he is requiring 15 L high flow and NRB intermittently. -On Lasix as needed, will give 40 mg of IV Lasix today.  Encouraged the patient to sit up in chair in the daytime use I-S and flutter valve for pulmonary toiletry and then prone in bed when at night.  Will advance activity and titrate down  oxygen as possible.    SpO2: 100 % O2 Flow Rate (L/min): 15 L/min FiO2 (%): 90 %   Recent Labs  Lab 11/05/19 0306 11/05/19 0306 11/06/19 0503 11/06/19 0503 11/06/19 0504 11/07/19 0144 11/08/19 0454 11/09/19 0408 11/10/19 0500 11/11/19 0358  WBC 13.1*   < > 12.3*   < >  --  11.7* 10.2 8.7 10.2 9.8  PLT 410*   < > 436*   < >  --  470* 470* 500* 498* 504*  CRP 11.8*  --  19.8*  --   --  20.7* 18.8* 11.8*  --   --   BNP 48.3  --   --   --  32.4 28.9 34.1  --   --   --   DDIMER 3.54*   < > 3.39*   < >  --  3.07* 4.20* 2.70* 2.10* 1.54*  PROCALCITON 0.24  --  0.21  --   --  0.17 0.20  --   --   --   AST 46*   < > 38   < >  --  34 32 85* 42* 37  ALT 62*   < > 56*   < >  --  47* 46* 95* 91* 87*  ALKPHOS 126   < > 113   < >  --  108 109 124 110 117  BILITOT 1.1   < > 1.3*   < >  --  1.3* 1.3* 1.1 0.4 0.5  ALBUMIN 2.9*   < > 2.7*   < >  --  2.5* 2.4* 2.6* 2.5* 2.7*   < > = values in this interval not displayed.     Asymptomatic transaminitis due to viral infection.  Trending down, continue to monitor  Smoking.  Counseled to quit.  Stress-induced hypertension.  On as needed labetalol.  Monitor.  Risisng D-dimer due to inflammation.  - CTA negative, -ve leg ultrasound.  -Continue with Lovenox 0.5 mg/kg every 12 hours  Mild epistaxis .  Completely resolved, nasal saline spray and Afrin ordered, see #5 above.  Oral thrush -Started on Magic mouthwash   Condition - Extremely Guarded  Family Communication  : [name] - [phone] on 11/01/19, 11/03/19, November 04, 2019, November 05, 2019 ,11/08/2019  Code Status :  Full  Consults  :  None  Procedures  :    CTA - no PE  Leg [place] - No DVT   PUD Prophylaxis : PPI  Disposition Plan  :    Status is: Inpatient  Remains inpatient appropriate because:Hemodynamically unstable and IV treatments appropriate due to intensity of illness or inability to take PO   Dispo: The patient is from: Home               Anticipated d/c is to: Home              Anticipated d/c date is: > 3 days              Patient currently is not medically stable to d/c.  DVT Prophylaxis  :  Lovenox   Lab Results  Component Value Date   PLT 504 (H) 11/11/2019    Diet :  Diet Order            Diet Heart Room service appropriate? Yes; Fluid consistency: Thin  Diet effective now                  Inpatient Medications  Scheduled Meds: . vitamin C  500 mg Oral Daily  . baricitinib  4 mg Oral Daily  . cholecalciferol  1,000 Units Oral Daily  . enoxaparin (LOVENOX) injection  60 mg Subcutaneous Q12H  . influenza vac split quadrivalent PF  0.5 mL Intramuscular Tomorrow-1000  . insulin aspart  0-15 Units Subcutaneous TID WC  . insulin aspart  0-5  Units Subcutaneous QHS  . magic mouthwash  10 mL Oral QID  . methylPREDNISolone (SOLU-MEDROL) injection  80 mg Intravenous Q12H  . metoprolol tartrate  50 mg Oral BID  . nicotine  14 mg Transdermal Daily  . pantoprazole  40 mg Oral Daily  . pneumococcal 23 valent vaccine  0.5 mL Intramuscular Tomorrow-1000  . senna-docusate  2 tablet Oral BID  . zinc sulfate  220 mg Oral Daily   Continuous Infusions: . sodium chloride Stopped (10/31/19 1114)   PRN Meds:.sodium chloride, acetaminophen, albuterol, chlorpheniramine-HYDROcodone, guaiFENesin-dextromethorphan, labetalol, lip balm, sodium chloride  Antibiotics  :    Anti-infectives (From admission, onward)   Start     Dose/Rate Route Frequency Ordered Stop   10/31/19 1000  remdesivir 100 mg in sodium chloride 0.9 % 100 mL IVPB        100 mg 200 mL/hr over 30 Minutes Intravenous Daily 10/30/19 1851 11/03/19 1124   10/30/19 1930  remdesivir 100 mg in sodium chloride 0.9 % 100 mL IVPB       "Followed by" Linked Group Details   100 mg 200 mL/hr over 30 Minutes Intravenous  Once 10/30/19 1851 10/30/19 2106   10/30/19 1900  remdesivir 100 mg in sodium chloride 0.9 % 100 mL IVPB       "Followed by" Linked Group Details    100 mg 200 mL/hr over 30 Minutes Intravenous  Once 10/30/19 1851 10/30/19 2106       Mliss Fritz Tyese Finken M.D on 11/11/2019 at 2:39 PM  To page go to [email or web address]   [facility] -  Office  [phone]     See all Orders from today for further details    Objective:   Vitals:   11/11/19 0744 11/11/19 0900 11/11/19 1100 11/11/19 1211  BP: 130/72   102/73  Pulse: 100   74  Resp:      Temp: 97.9 F (36.6 C)   (!) 97.4 F (36.3 C)  TempSrc: Oral   Oral  SpO2: 99% 94% (!) 82% 100%  Weight:      Height:        Wt Readings from Last 3 Encounters:  10/31/19 130.5 kg  12/22/18 124.7 kg  10/15/16 124.7 kg     Intake/Output Summary (Last 24 hours) at 11/11/2019 1439 Last data filed at 11/11/2019 0850 Gross per 24 hour  Intake 240 ml  Output 2365 ml  Net -2125 ml     Physical Exam  Awake Alert, Oriented X 3, No new F.N deficits, Normal affect Symmetrical Chest wall movement, Good air movement bilaterally, CTAB RRR,No Gallops,Rubs or new Murmurs, No Parasternal Heave +ve B.Sounds, Abd Soft, No tenderness, No rebound - guarding or rigidity. No Cyanosis, Clubbing or edema, No new Rash or bruise        Data Review:    CBC Recent Labs  Lab 11/05/19 0306 11/05/19 0306 11/06/19 0503 11/06/19 0503 11/07/19 0144 11/08/19 0454 11/09/19 0408 11/10/19 0500 11/11/19 0358  WBC 13.1*   < > 12.3*   < > 11.7* 10.2 8.7 10.2 9.8  HGB 15.0   < > 15.2   < > 14.5 13.7 14.1 13.7 13.2  HCT 44.4   < > 44.5   < > 42.8 40.8 42.2 41.3 40.7  PLT 410*   < > 436*   < > 470* 470* 500* 498* 504*  MCV 95.7   < > 95.9   < > 95.5 95.3 96.6 98.1 96.9  MCH 32.3   < > 32.8   < >  32.4 32.0 32.3 32.5 31.4  MCHC 33.8   < > 34.2   < > 33.9 33.6 33.4 33.2 32.4  RDW 11.9   < > 12.0   < > 11.9 11.8 11.7 11.9 11.7  LYMPHSABS 1.4  --  1.4  --  1.2 1.3 1.0  --   --   MONOABS 1.0  --  1.1*  --  1.0 0.8 0.8  --   --   EOSABS 0.0  --  0.2  --  0.2 0.2 0.0  --   --   BASOSABS 0.0  --  0.0  --   0.0 0.0 0.0  --   --    < > = values in this interval not displayed.    Recent Labs  Lab 11/05/19 0306 11/05/19 0306 11/05/19 1411 11/06/19 0503 11/06/19 0503 11/06/19 0504 11/07/19 0144 11/08/19 0454 11/09/19 0408 11/10/19 0500 11/11/19 0358  NA 133*   < >  --  130*   < >  --  132* 131* 132* 132* 133*  K 4.0   < >  --  4.1   < >  --  4.0 3.8 4.4 4.5 4.4  CL 97*   < >  --  92*   < >  --  94* 92* 93* 94* 92*  CO2 22   < >  --  25   < >  --  24 28 28  32 30  GLUCOSE 141*   < >  --  134*   < >  --  91 109* 125* 178* 128*  BUN 18   < >  --  21*   < >  --  20 20 17 15 18   CREATININE 0.99   < >  --  1.10   < >  --  1.11 1.15 1.05 1.14 1.12  CALCIUM 8.9   < >  --  9.0   < >  --  9.0 9.0 8.8* 9.0 9.1  AST 46*   < >  --  38   < >  --  34 32 85* 42* 37  ALT 62*   < >  --  56*   < >  --  47* 46* 95* 91* 87*  ALKPHOS 126   < >  --  113   < >  --  108 109 124 110 117  BILITOT 1.1   < >  --  1.3*   < >  --  1.3* 1.3* 1.1 0.4 0.5  ALBUMIN 2.9*   < >  --  2.7*   < >  --  2.5* 2.4* 2.6* 2.5* 2.7*  MG 2.1  --   --  2.3  --   --  2.3 2.4  --   --   --   CRP 11.8*  --   --  19.8*  --   --  20.7* 18.8* 11.8*  --   --   DDIMER 3.54*   < >  --  3.39*   < >  --  3.07* 4.20* 2.70* 2.10* 1.54*  PROCALCITON 0.24  --   --  0.21  --   --  0.17 0.20  --   --   --   TSH  --   --  1.268  --   --   --   --   --   --   --   --   BNP 48.3  --   --   --   --  32.4 28.9 34.1  --   --   --    < > = values in this interval not displayed.    ------------------------------------------------------------------------------------------------------------------ No results for input(s): CHOL, HDL, LDLCALC, TRIG, CHOLHDL, LDLDIRECT in the last 72 hours.  Lab Results  Component Value Date   HGBA1C 5.6 11/01/2019   ------------------------------------------------------------------------------------------------------------------ No results for input(s): TSH, T4TOTAL, T3FREE, THYROIDAB in the last 72 hours.  Invalid  input(s): FREET3  Cardiac Enzymes No results for input(s): CKMB, TROPONINI, MYOGLOBIN in the last 168 hours.  Invalid input(s): CK ------------------------------------------------------------------------------------------------------------------    Component Value Date/Time   BNP 34.1 11/08/2019 0454    Micro Results No results found for this or any previous visit (from the past 240 hour(s)).  Radiology Reports CT Angio Chest PE W and/or Wo Contrast  Result Date: 10/30/2019 CLINICAL DATA:  COVID-19 positive, worsening shortness of breath at home, O2 sat 77% on room air EXAM: CT ANGIOGRAPHY CHEST WITH CONTRAST TECHNIQUE: Multidetector CT imaging of the chest was performed using the standard protocol during bolus administration of intravenous contrast. Multiplanar CT image reconstructions and MIPs were obtained to evaluate the vascular anatomy. CONTRAST:  OMNIPAQUE IOHEXOL 350 MG/ML SOLN COMPARISON:  Radiograph 10/30/2019 FINDINGS: Cardiovascular: Satisfactory opacification the pulmonary arteries to the segmental level. No pulmonary artery filling defects are identified. Central pulmonary arteries are normal caliber. The aortic root is suboptimally assessed given cardiac pulsation artifact. The aorta is normal caliber. No acute luminal abnormality of the imaged aorta. No periaortic stranding or hemorrhage. Normal 3 vessel branching of the aortic arch. Proximal great vessels are unremarkable. Normal heart size. No pericardial effusion. No major venous abnormalities. Mediastinum/Nodes: No mediastinal fluid or gas. Normal thyroid gland and thoracic inlet. No acute abnormality of the trachea or esophagus. Scattered borderline enlarged nodes with preserved nodal architecture within the mediastinum and hila for instance a 12 mm left paratracheal node (4/37), 10 mm right paratracheal node (4/37), and an 11 mm right hilar node (4/51) are favored to be reactive. No worrisome enlarged mediastinal, hilar  or axillary adenopathy. Lungs/Pleura: Multifocal areas of mixed consolidative and ground-glass opacity with interstitial/septal thickening in a crazy paving pattern which is an often reported imaging feature COVID 19 among other infectious and inflammatory etiologies. No pneumothorax or effusion. Mild diffuse airways thickening. Upper Abdomen: No acute abnormalities present in the visualized portions of the upper abdomen. Musculoskeletal: No acute osseous abnormality or suspicious osseous lesion. Multilevel degenerative changes are present in the imaged portions of the spine. No worrisome chest wall lesions. Review of the MIP images confirms the above findings. IMPRESSION: 1. No evidence of pulmonary embolism. 2. Multifocal areas of mixed consolidative and ground-glass opacity with interstitial/septal thickening compatible with reported imaging features of COVID 19. 3. Scattered borderline enlarged nodes within the mediastinum and hila, favored to be reactive. Electronically Signed   By: Kreg Shropshire M.D.   On: 10/30/2019 20:59   DG Chest Port 1 View  Result Date: 11/05/2019 CLINICAL DATA:  Shortness of breath, COVID positive EXAM: PORTABLE CHEST 1 VIEW COMPARISON:  Chest radiograph dated 11/01/2019. FINDINGS: The cardiac silhouette is obscured, but appears unchanged. Moderate bilateral airspace opacities appear decreased on the right and unchanged on the left. No large pleural effusion is identified. There is no pneumothorax. The osseous structures are unremarkable. IMPRESSION: Moderate bilateral airspace opacities, decreased on the right and unchanged on the left. Electronically Signed   By: Romona Curls M.D.   On: 11/05/2019 08:45   DG Chest [facility]  Result Date: 11/01/2019 CLINICAL DATA:  COVID-19 positive with airspace opacity EXAM: PORTABLE CHEST 1 VIEW COMPARISON:  Chest radiograph and chest CT October 30, 2019 FINDINGS: Airspace opacity is again noted throughout much of the right lung with  greatest concentration of opacity in the right mid and lower lung regions. Subtle opacity noted in the left mid and lower lung regions. Stable cardiac enlargement. Pulmonary vascularity normal. No adenopathy. No bone lesions. IMPRESSION: Stable multifocal airspace opacity, consistent with known atypical organism pneumonia. No new areas of opacity evident. Stable cardiac prominence. No adenopathy appreciable. Electronically Signed   By: Bretta Bang III M.D.   On: 11/01/2019 08:26   DG Chest Port 1 View  Result Date: 10/30/2019 CLINICAL DATA:  Shortness of breath.  COVID. EXAM: PORTABLE CHEST 1 VIEW COMPARISON:  10/15/2016 FINDINGS: Multifocal airspace opacities throughout the right greater than left lungs. No visible pleural effusions. No pneumothorax. Cardiac silhouette is accentuated by low lung volumes and portable technique. No acute osseous abnormality. IMPRESSION: Multifocal right greater than left airspace opacities, compatible with multifocal pneumonia and reported history of COVID. Electronically Signed   By: Feliberto Harts MD   On: 10/30/2019 19:36   VAS [place] LOWER EXTREMITY VENOUS (DVT)  Result Date: 11/03/2019  Lower Venous DVTStudy Indications: D-dimer.  Anticoagulation: Lovenox. Comparison Study: No prior studies. Performing Technologist: Jean Rosenthal  Examination Guidelines: A complete evaluation includes B-mode imaging, spectral Doppler, color Doppler, and power Doppler as needed of all accessible portions of each vessel. Bilateral testing is considered an integral part of a complete examination. Limited examinations for reoccurring indications may be performed as noted. The reflux portion of the exam is performed with the patient in reverse Trendelenburg.  +---------+---------------+---------+-----------+----------+--------------+ RIGHT    CompressibilityPhasicitySpontaneityPropertiesThrombus Aging +---------+---------------+---------+-----------+----------+--------------+ CFV       Full           Yes      Yes                                 +---------+---------------+---------+-----------+----------+--------------+ SFJ      Full                                                        +---------+---------------+---------+-----------+----------+--------------+ FV Prox  Full                                                        +---------+---------------+---------+-----------+----------+--------------+ FV Mid   Full                                                        +---------+---------------+---------+-----------+----------+--------------+ FV DistalFull                                                        +---------+---------------+---------+-----------+----------+--------------+  PFV      Full                                                        +---------+---------------+---------+-----------+----------+--------------+ POP      Full           Yes      Yes                                 +---------+---------------+---------+-----------+----------+--------------+ PTV      Full                                                        +---------+---------------+---------+-----------+----------+--------------+ PERO     Full                                                        +---------+---------------+---------+-----------+----------+--------------+   +---------+---------------+---------+-----------+----------+--------------+ LEFT     CompressibilityPhasicitySpontaneityPropertiesThrombus Aging +---------+---------------+---------+-----------+----------+--------------+ CFV      Full           Yes      Yes                                 +---------+---------------+---------+-----------+----------+--------------+ SFJ      Full                                                        +---------+---------------+---------+-----------+----------+--------------+ FV Prox  Full                                                         +---------+---------------+---------+-----------+----------+--------------+ FV Mid   Full                                                        +---------+---------------+---------+-----------+----------+--------------+ FV DistalFull                                                        +---------+---------------+---------+-----------+----------+--------------+ PFV      Full                                                        +---------+---------------+---------+-----------+----------+--------------+  POP      Full           Yes      Yes                                 +---------+---------------+---------+-----------+----------+--------------+ PTV      Full                                                        +---------+---------------+---------+-----------+----------+--------------+ PERO     Full                                                        +---------+---------------+---------+-----------+----------+--------------+     Summary: RIGHT: - There is no evidence of deep vein thrombosis in the lower extremity.  - No cystic structure found in the popliteal fossa.  LEFT: - There is no evidence of deep vein thrombosis in the lower extremity.  - No cystic structure found in the popliteal fossa.  *See table(s) above for measurements and observations. Electronically signed by Coral ElseVance Brabham MD on 11/03/2019 at 7:41:49 PM.    Final

## 2019-11-11 NOTE — TOC Progression Note (Signed)
Transition of Care [facility]) - Progression Note    Patient Details  Name: Brandon Snow MRN: [id number] Date of Birth: Jun 16, 1960  [facility] CM/SW Contact  Nance Pear, RN Phone Number: 11/11/2019, 11:48 AM  Clinical Narrative:    Information obtained from chart due to patient still on 15 L HF/NRB mask.  Patient is uninsured, COVID clinic 11/18/19 at 1100, needs PCP, [facility] for follow up after covid clinic.  Patient will probably need charity assistance with oxygen, 3in1, and rolling walker.  Probable TOC pharmacy/[facility], monitor for any DOAC needs.  TOC following for progression of care.   Expected Discharge Plan: Home/Self Care Barriers to Discharge: Continued Medical Work up  Expected Discharge Plan and Services Expected Discharge Plan: Home/Self Care   Discharge Planning Services: CM Consult   Living arrangements for the past 2 months: Apartment                                       Social Determinants of Health (SDOH) Interventions    Readmission Risk Interventions No flowsheet data found.

## 2019-11-11 NOTE — Progress Notes (Signed)
Physical Therapy Treatment Patient Details Name: Brandon Snow MRN: [id number] DOB: November 30, 1960 Today's Date: 11/11/2019    History of Present Illness 58yo male c/o SOB and cough. Hypoxic to 77% in the ED on RA. PE negative. Covid positive. Unvaccinated.  PMH HTN    PT Comments    Mobility limited this session by O2 needs and tachycardia. Pt on 15L HFNC, SpO2 96%, HR 117. LE exercises seated in recliner. Pt performed 3 standing trials supervision. With each trial, desat to 83% and HR into 140s, with pt standing < 30 seconds. After 3rd trial, 15L NRB required to recover to 92%. NRB removed and Spo2 88%. Pt opted to replace NRB. After 5 minutes on 15L HFNC + 15L NRB, SpO2 100% and HR 110.   Follow Up Recommendations  Home health PT     Equipment Recommendations  Rolling walker with 5" wheels;3in1 (PT)    Recommendations for Other Services       Precautions / Restrictions Precautions Precautions: Other (comment) Precaution Comments: watch sats    Mobility  Bed Mobility               General bed mobility comments: pt sitting up in recliner. Pt reports he is sleeping in recliner.  Transfers Overall transfer level: Needs assistance Equipment used: None   Sit to Stand: Supervision         General transfer comment: supervision for safety, increased time. Standing trial x 3. HR in the 140s and desat to 83%.  Ambulation/Gait             General Gait Details: unable to amb this session due to desat and tachycardia   Stairs             Wheelchair Mobility    Modified Rankin (Stroke Patients Only)       Balance Overall balance assessment: Needs assistance Sitting-balance support: Feet supported;No upper extremity supported Sitting balance-Leahy Scale: Good       Standing balance-Leahy Scale: Fair                              Cognition Arousal/Alertness: Awake/alert Behavior During Therapy: WFL for tasks  assessed/performed Overall Cognitive Status: Within Functional Limits for tasks assessed                                        Exercises General Exercises - Lower Extremity Ankle Circles/Pumps: AROM;Both;10 reps;Seated Long Arc Quad: AROM;Right;Left;10 reps;Seated Hip Flexion/Marching: AROM;Right;Left;10 reps;Standing    General Comments General comments (skin integrity, edema, etc.): 15L HFNC 96% at rest prior to mobility. SpO2 83% and HR 140s with all mobility attempts. 15L NRB required after 3rd standing trial to recover to 92%. NRB removed and SpO2 88% on 15L and HR 123. Pt reapplied NRB. After 5 minutes Spo2 100% and HR 110.      Pertinent Vitals/Pain Pain Assessment: No/denies pain    Home Living                      Prior Function            PT Goals (current goals can now be found in the care plan section) Acute Rehab PT Goals Patient Stated Goal: home Progress towards PT goals: Not progressing toward goals - comment (limited by increased O2 needs)    Frequency  Min 3X/week      PT Plan Current plan remains appropriate    Co-evaluation              AM-PAC PT "6 Clicks" Mobility   Outcome Measure  Help needed turning from your back to your side while in a flat bed without using bedrails?: A Little Help needed moving from lying on your back to sitting on the side of a flat bed without using bedrails?: A Little Help needed moving to and from a bed to a chair (including a wheelchair)?: A Little Help needed standing up from a chair using your arms (e.g., wheelchair or bedside chair)?: A Little Help needed to walk in hospital room?: A Little Help needed climbing 3-5 steps with a railing? : A Lot 6 Click Score: 17    End of Session Equipment Utilized During Treatment: Oxygen Activity Tolerance: Treatment limited secondary to medical complications (Comment) (desat, tachy) Patient left: in chair;with call bell/phone within  reach Nurse Communication: Mobility status PT Visit Diagnosis: Unsteadiness on feet (R26.81);Difficulty in walking, not elsewhere classified (R26.2);Muscle weakness (generalized) (M62.81)     Time: 9753-0051 PT Time Calculation (min) (ACUTE ONLY): 24 min  Charges:  $Gait Training: 8-22 mins $Therapeutic Exercise: 8-22 mins                     Aida Raider, PT  Office # [phone] Pager [phone]    Ilda Foil 11/11/2019, 10:14 AM

## 2019-11-12 DIAGNOSIS — U071 COVID-19: Secondary | ICD-10-CM | POA: Diagnosis not present

## 2019-11-12 DIAGNOSIS — J9601 Acute respiratory failure with hypoxia: Secondary | ICD-10-CM

## 2019-11-12 DIAGNOSIS — J1282 Pneumonia due to coronavirus disease 2019: Secondary | ICD-10-CM | POA: Diagnosis not present

## 2019-11-12 LAB — COMPREHENSIVE METABOLIC PANEL
ALT: 90 U/L — ABNORMAL HIGH (ref 0–44)
AST: 35 U/L (ref 15–41)
Albumin: 2.9 g/dL — ABNORMAL LOW (ref 3.5–5.0)
Alkaline Phosphatase: 115 U/L (ref 38–126)
Anion gap: 12 (ref 5–15)
BUN: 19 mg/dL (ref 6–20)
CO2: 31 mmol/L (ref 22–32)
Calcium: 9.1 mg/dL (ref 8.9–10.3)
Chloride: 90 mmol/L — ABNORMAL LOW (ref 98–111)
Creatinine, Ser: 1.05 mg/dL (ref 0.61–1.24)
GFR, Estimated: >60 mL/min (ref >60)
Glucose, Bld: 175 mg/dL — ABNORMAL HIGH (ref 70–99)
Potassium: 4 mmol/L (ref 3.5–5.1)
Sodium: 133 mmol/L — ABNORMAL LOW (ref 135–145)
Total Bilirubin: 0.7 mg/dL (ref 0.3–1.2)
Total Protein: 7.7 g/dL (ref 6.5–8.1)

## 2019-11-12 LAB — CBC
HCT: 41.1 % (ref 39.0–52.0)
Hemoglobin: 13.8 g/dL (ref 13.0–17.0)
MCH: 32.2 pg (ref 26.0–34.0)
MCHC: 33.6 g/dL (ref 30.0–36.0)
MCV: 96 fL (ref 80.0–100.0)
Platelets: 539 10*3/uL — ABNORMAL HIGH (ref 150–400)
RBC: 4.28 MIL/uL (ref 4.22–5.81)
RDW: 11.7 % (ref 11.5–15.5)
WBC: 10 10*3/uL (ref 4.0–10.5)
nRBC: 0 % (ref 0.0–0.2)

## 2019-11-12 LAB — GLUCOSE, CAPILLARY
Glucose-Capillary: 206 mg/dL — ABNORMAL HIGH (ref 70–99)
Glucose-Capillary: 214 mg/dL — ABNORMAL HIGH (ref 70–99)
Glucose-Capillary: 156 mg/dL — ABNORMAL HIGH (ref 70–99)
Glucose-Capillary: 282 mg/dL — ABNORMAL HIGH (ref 70–99)

## 2019-11-12 LAB — D-DIMER, QUANTITATIVE: D-Dimer, Quant: 1.22 ug/mL-FEU — ABNORMAL HIGH (ref 0.00–0.50)

## 2019-11-12 MED ORDER — FUROSEMIDE 10 MG/ML IJ SOLN
40.0000 mg | Freq: Once | INTRAMUSCULAR | Status: AC
  Administered 2019-11-12: 40 mg via INTRAVENOUS
  Filled 2019-11-12: qty 4

## 2019-11-12 MED ORDER — INSULIN ASPART 100 UNIT/ML ~~LOC~~ SOLN
3.0000 [IU] | Freq: Three times a day (TID) | SUBCUTANEOUS | Status: DC
  Administered 2019-11-12 – 2019-11-14 (×7): 3 [IU] via SUBCUTANEOUS

## 2019-11-12 NOTE — Progress Notes (Addendum)
Physical Therapy Treatment Patient Details Name: Brandon Snow MRN: [id number] DOB: Jul 01, 1960 Today's Date: 11/12/2019    History of Present Illness Pt is a 59 y.o. male admitted 10/30/19 with SOB and cough; workup for acute hypoxic respiratory failure due to COVID-19 pneumonia and ARDS. Negative for PE. PMH includes HTN; pt unvaccinated.   PT Comments    Pt progressing well with mobility, extremely motivated to participate and regain PLOF. Today's session focused on hallway ambulation, gait training with rollator for added energy conservation and stability. SpO2 >/88% on 4L O2 [place] with activity; DOE 3/4. Pt remains limited by decreased activity tolerance. Will continue to follow acutely.    Follow Up Recommendations  Home health PT     Equipment Recommendations   (TBD on rollator)    Recommendations for Other Services       Precautions / Restrictions Precautions Precautions: Fall Restrictions Weight Bearing Restrictions: No    Mobility  Bed Mobility               General bed mobility comments: up in recliner on entry  Transfers Overall transfer level: Independent Equipment used: None Transfers: Sit to/from Stand              Ambulation/Gait Ambulation/Gait assistance: Chief Operating Officer (Feet): 84 Feet Assistive device: 4-wheeled walker Gait Pattern/deviations: Step-through pattern;Decreased stride length Gait velocity: Decreased   General Gait Details: Slow, guarded gait in room without DME, use of rollator in hallway for energy conservation and added stability; supervision for safety. SpO2 88-90% on 4L O2 [place]; DOE 3/4   Stairs             Wheelchair Mobility    Modified Rankin (Stroke Patients Only)       Balance Overall balance assessment: Needs assistance Sitting-balance support: Feet supported;No upper extremity supported Sitting balance-Leahy Scale: Good       Standing balance-Leahy Scale: Fair                               Cognition Arousal/Alertness: Awake/alert Behavior During Therapy: WFL for tasks assessed/performed Overall Cognitive Status: Within Functional Limits for tasks assessed                                 General Comments: very motivated      Exercises      General Comments General comments (skin integrity, edema, etc.): SpO2 88% on 4L O2 with ambulation; 94% on 3L O2 [place] at rest      Pertinent Vitals/Pain Pain Assessment: No/denies pain    Home Living                      Prior Function            PT Goals (current goals can now be found in the care plan section) Acute Rehab PT Goals Patient Stated Goal: Walk more and return home PT Goal Formulation: With patient Time For Goal Achievement: 11/26/19 Potential to Achieve Goals: Good Progress towards PT goals: Progressing toward goals    Frequency    Min 3X/week      PT Plan Current plan remains appropriate    Co-evaluation              AM-PAC PT "6 Clicks" Mobility   Outcome Measure  Help needed turning from your back to your side while in  a flat bed without using bedrails?: None Help needed moving from lying on your back to sitting on the side of a flat bed without using bedrails?: None Help needed moving to and from a bed to a chair (including a wheelchair)?: None Help needed standing up from a chair using your arms (e.g., wheelchair or bedside chair)?: None Help needed to walk in hospital room?: A Little Help needed climbing 3-5 steps with a railing? : A Little 6 Click Score: 22    End of Session Equipment Utilized During Treatment: Oxygen Activity Tolerance: Patient tolerated treatment well Patient left: in chair;with call bell/phone within reach Nurse Communication: Mobility status PT Visit Diagnosis: Unsteadiness on feet (R26.81);Difficulty in walking, not elsewhere classified (R26.2);Muscle weakness (generalized) (M62.81)     Time: 5397-6734 PT Time  Calculation (min) (ACUTE ONLY): 21 min  Charges:  $Gait Training: 8-22 mins                     Ina Homes, PT, DPT Acute Rehabilitation Services  Pager [phone] Office [phone]  Malachy Chamber 11/12/2019, 5:42 PM

## 2019-11-12 NOTE — Progress Notes (Signed)
Ok to add Novolog 3 units TID with meals per Dr. Randol Kern.  Ulyses Southward, PharmD, BCIDP, AAHIVP, CPP Infectious Disease Pharmacist 11/12/2019 12:07 PM

## 2019-11-12 NOTE — Progress Notes (Signed)
PROGRESS NOTE                                                                                                                                                                                                             Patient Demographics:    Brandon Snow, is a 59 y.o. male, DOB - 18-Sep-1960, WUJ:[id number]  Outpatient Primary MD for the patient is Patient, No Pcp Per    LOS - 12  Admit date - 10/30/2019    Chief Complaint  Patient presents with   Cough   Shortness of Breath       Brief Narrative  -  Brandon Snow is a 59 y.o. male with medical history significant of hypertension presented to the ED on 9/29 via EMS for evaluation of shortness of breath and cough.  Oxygen saturation 77% on room air, there he was diagnosed with acute hypoxic respiratory failure due to COVID-19 pneumonia and ARDS.  He was admitted to the hospital and placed on heated high flow.   Subjective:   Reports improved, mainly with activity, cough has improved as well, denies any chest pain.   Assessment  & Plan :    Acute Hypoxic Resp. Failure due to Acute Covid 19 Viral Pneumonitis during the ongoing 2020 Covid 19 Pandemic  - he is unfortunately unvaccinated and has incurred severe parenchymal lung injury with ARDS due to COVID-19 pneumonia,  -Continue with IV steroids . -Treated with IV remdesivir . -Treated with baricitinib.  . -Was encouraged to use incentive spirometry, flutter valve, out of bed to chair . -Continues to improve, this morning he is on 9 L nasal cannula, able to wean to 4 L high flow nasal cannula currently . -On Lasix as needed, will give another 40 mg of IV Lasix today .  Encouraged the patient to sit up in chair in the daytime use I-S and flutter valve for pulmonary toiletry and then prone in bed when at night.  Will advance activity and titrate down oxygen as possible.    SpO2: 97 % O2 Flow Rate (L/min): 4  L/min FiO2 (%): 90 %   Recent Labs  Lab 11/06/19 0503 11/06/19 0503 11/06/19 0504 11/07/19 0144 11/07/19 0144 11/08/19 0454 11/09/19 0408 11/10/19 0500 11/11/19 0358 11/12/19 0220  WBC 12.3*   < >  --  11.7*   < >  10.2 8.7 10.2 9.8 10.0  PLT 436*   < >  --  470*   < > 470* 500* 498* 504* 539*  CRP 19.8*  --   --  20.7*  --  18.8* 11.8*  --   --   --   BNP  --   --  32.4 28.9  --  34.1  --   --   --   --   DDIMER 3.39*   < >  --  3.07*   < > 4.20* 2.70* 2.10* 1.54* 1.22*  PROCALCITON 0.21  --   --  0.17  --  0.20  --   --   --   --   AST 38   < >  --  34   < > 32 85* 42* 37 35  ALT 56*   < >  --  47*   < > 46* 95* 91* 87* 90*  ALKPHOS 113   < >  --  108   < > 109 124 110 117 115  BILITOT 1.3*   < >  --  1.3*   < > 1.3* 1.1 0.4 0.5 0.7  ALBUMIN 2.7*   < >  --  2.5*   < > 2.4* 2.6* 2.5* 2.7* 2.9*   < > = values in this interval not displayed.     Asymptomatic transaminitis due to viral infection.  Trending down, continue to monitor  Smoking.  Counseled to quit.  Stress-induced hypertension.  On as needed labetalol.  Monitor.  Risisng D-dimer due to inflammation.  - CTA negative, -ve leg ultrasound.  -Trending down  Mild epistaxis .  Resolved, will DC Afrin and encouraged to use normal saline nasal spray   oral thrush -Started on Magic mouthwash   Condition - Extremely Guarded  Family Communication  : Friend Ms. Brendia Sacksequila -- [phone] on 11/12/2019  Code Status :  Full  Consults  :  None  Procedures  :    CTA - no PE  Leg US - No DVT   PUD Prophylaxis : PPI  Disposition Plan  :    Status is: Inpatient  Remains inpatient appropriate because:Hemodynamically unstable and IV treatments appropriate due to intensity of illness or inability to take PO   Dispo: The patient is from: Home              Anticipated d/c is to: Home              Anticipated d/c date is: > 3 days              Patient currently is not medically stable to d/c.  DVT Prophylaxis   :  Lovenox   Lab Results  Component Value Date   PLT 539 (H) 11/12/2019    Diet :  Diet Order            Diet Heart Room service appropriate? Yes; Fluid consistency: Thin  Diet effective now                  Inpatient Medications  Scheduled Meds:  vitamin C  500 mg Oral Daily   baricitinib  4 mg Oral Daily   cholecalciferol  1,000 Units Oral Daily   enoxaparin (LOVENOX) injection  60 mg Subcutaneous Q12H   influenza vac split quadrivalent PF  0.5 mL Intramuscular Tomorrow-1000   insulin aspart  0-15 Units Subcutaneous TID WC   insulin aspart  0-5 Units Subcutaneous QHS   insulin  aspart  3 Units Subcutaneous TID WC   magic mouthwash  10 mL Oral QID   methylPREDNISolone (SOLU-MEDROL) injection  80 mg Intravenous Q12H   metoprolol tartrate  50 mg Oral BID   nicotine  14 mg Transdermal Daily   pantoprazole  40 mg Oral Daily   pneumococcal 23 valent vaccine  0.5 mL Intramuscular Tomorrow-1000   senna-docusate  2 tablet Oral BID   zinc sulfate  220 mg Oral Daily   Continuous Infusions:  sodium chloride Stopped (10/31/19 1114)   PRN Meds:.sodium chloride, acetaminophen, albuterol, chlorpheniramine-HYDROcodone, guaiFENesin-dextromethorphan, labetalol, lip balm, sodium chloride  Antibiotics  :    Anti-infectives (From admission, onward)   Start     Dose/Rate Route Frequency Ordered Stop   10/31/19 1000  remdesivir 100 mg in sodium chloride 0.9 % 100 mL IVPB        100 mg 200 mL/hr over 30 Minutes Intravenous Daily 10/30/19 1851 11/03/19 1124   10/30/19 1930  remdesivir 100 mg in sodium chloride 0.9 % 100 mL IVPB       "Followed by" Linked Group Details   100 mg 200 mL/hr over 30 Minutes Intravenous  Once 10/30/19 1851 10/30/19 2106   10/30/19 1900  remdesivir 100 mg in sodium chloride 0.9 % 100 mL IVPB       "Followed by" Linked Group Details   100 mg 200 mL/hr over 30 Minutes Intravenous  Once 10/30/19 1851 10/30/19 2106       Mliss Fritz Davianna Deutschman M.D  on 11/12/2019 at 4:32 PM  To page go to [email or web address]   [facility] -  Office  [phone]     See all Orders from today for further details    Objective:   Vitals:   11/12/19 1037 11/12/19 1222 11/12/19 1457 11/12/19 1618  BP:  121/71  116/76  Pulse:  78  87  Resp:      Temp:  97.7 F (36.5 C)  (!) 97.4 F (36.3 C)  TempSrc:  Oral  Oral  SpO2: 97% 99% 97% 97%  Weight:      Height:        Wt Readings from Last 3 Encounters:  10/31/19 130.5 kg  12/22/18 124.7 kg  10/15/16 124.7 kg     Intake/Output Summary (Last 24 hours) at 11/12/2019 1632 Last data filed at 11/12/2019 1324 Gross per 24 hour  Intake 300 ml  Output 1700 ml  Net -1400 ml     Physical Exam  Awake Alert, Oriented X 3, No new F.N deficits, Normal affect Symmetrical Chest wall movement, Good air movement bilaterally, CTAB RRR,No Gallops,Rubs or new Murmurs, No Parasternal Heave +ve B.Sounds, Abd Soft, No tenderness, No rebound - guarding or rigidity. No Cyanosis, Clubbing or edema, No new Rash or bruise         Data Review:    CBC Recent Labs  Lab 11/06/19 0503 11/06/19 0503 11/07/19 0144 11/07/19 0144 11/08/19 0454 11/09/19 0408 11/10/19 0500 11/11/19 0358 11/12/19 0220  WBC 12.3*   < > 11.7*   < > 10.2 8.7 10.2 9.8 10.0  HGB 15.2   < > 14.5   < > 13.7 14.1 13.7 13.2 13.8  HCT 44.5   < > 42.8   < > 40.8 42.2 41.3 40.7 41.1  PLT 436*   < > 470*   < > 470* 500* 498* 504* 539*  MCV 95.9   < > 95.5   < > 95.3 96.6 98.1 96.9 96.0  MCH 32.8   < >  32.4   < > 32.0 32.3 32.5 31.4 32.2  MCHC 34.2   < > 33.9   < > 33.6 33.4 33.2 32.4 33.6  RDW 12.0   < > 11.9   < > 11.8 11.7 11.9 11.7 11.7  LYMPHSABS 1.4  --  1.2  --  1.3 1.0  --   --   --   MONOABS 1.1*  --  1.0  --  0.8 0.8  --   --   --   EOSABS 0.2  --  0.2  --  0.2 0.0  --   --   --   BASOSABS 0.0  --  0.0  --  0.0 0.0  --   --   --    < > = values in this interval not displayed.    Recent Labs  Lab 11/06/19 0503  11/06/19 0503 11/06/19 5852 11/07/19 0144 11/07/19 0144 11/08/19 0454 11/09/19 0408 11/10/19 0500 11/11/19 0358 11/12/19 0220  NA 130*   < >  --  132*   < > 131* 132* 132* 133* 133*  K 4.1   < >  --  4.0   < > 3.8 4.4 4.5 4.4 4.0  CL 92*   < >  --  94*   < > 92* 93* 94* 92* 90*  CO2 25   < >  --  24   < > 28 28 32 30 31  GLUCOSE 134*   < >  --  91   < > 109* 125* 178* 128* 175*  BUN 21*   < >  --  20   < > 20 17 15 18 19   CREATININE 1.10   < >  --  1.11   < > 1.15 1.05 1.14 1.12 1.05  CALCIUM 9.0   < >  --  9.0   < > 9.0 8.8* 9.0 9.1 9.1  AST 38   < >  --  34   < > 32 85* 42* 37 35  ALT 56*   < >  --  47*   < > 46* 95* 91* 87* 90*  ALKPHOS 113   < >  --  108   < > 109 124 110 117 115  BILITOT 1.3*   < >  --  1.3*   < > 1.3* 1.1 0.4 0.5 0.7  ALBUMIN 2.7*   < >  --  2.5*   < > 2.4* 2.6* 2.5* 2.7* 2.9*  MG 2.3  --   --  2.3  --  2.4  --   --   --   --   CRP 19.8*  --   --  20.7*  --  18.8* 11.8*  --   --   --   DDIMER 3.39*   < >  --  3.07*   < > 4.20* 2.70* 2.10* 1.54* 1.22*  PROCALCITON 0.21  --   --  0.17  --  0.20  --   --   --   --   BNP  --   --  32.4 28.9  --  34.1  --   --   --   --    < > = values in this interval not displayed.    ------------------------------------------------------------------------------------------------------------------ No results for input(s): CHOL, HDL, LDLCALC, TRIG, CHOLHDL, LDLDIRECT in the last 72 hours.  Lab Results  Component Value Date   HGBA1C 5.6 11/01/2019   ------------------------------------------------------------------------------------------------------------------ No results for input(s): TSH, T4TOTAL,  T3FREE, THYROIDAB in the last 72 hours.  Invalid input(s): FREET3  Cardiac Enzymes No results for input(s): CKMB, TROPONINI, MYOGLOBIN in the last 168 hours.  Invalid input(s): CK ------------------------------------------------------------------------------------------------------------------    Component Value  Date/Time   BNP 34.1 11/08/2019 0454    Micro Results No results found for this or any previous visit (from the past 240 hour(s)).  Radiology Reports CT Angio Chest PE W and/or Wo Contrast  Result Date: 10/30/2019 CLINICAL DATA:  COVID-19 positive, worsening shortness of breath at home, O2 sat 77% on room air EXAM: CT ANGIOGRAPHY CHEST WITH CONTRAST TECHNIQUE: Multidetector CT imaging of the chest was performed using the standard protocol during bolus administration of intravenous contrast. Multiplanar CT image reconstructions and MIPs were obtained to evaluate the vascular anatomy. CONTRAST:  OMNIPAQUE IOHEXOL 350 MG/ML SOLN COMPARISON:  Radiograph 10/30/2019 FINDINGS: Cardiovascular: Satisfactory opacification the pulmonary arteries to the segmental level. No pulmonary artery filling defects are identified. Central pulmonary arteries are normal caliber. The aortic root is suboptimally assessed given cardiac pulsation artifact. The aorta is normal caliber. No acute luminal abnormality of the imaged aorta. No periaortic stranding or hemorrhage. Normal 3 vessel branching of the aortic arch. Proximal great vessels are unremarkable. Normal heart size. No pericardial effusion. No major venous abnormalities. Mediastinum/Nodes: No mediastinal fluid or gas. Normal thyroid gland and thoracic inlet. No acute abnormality of the trachea or esophagus. Scattered borderline enlarged nodes with preserved nodal architecture within the mediastinum and hila for instance a 12 mm left paratracheal node (4/37), 10 mm right paratracheal node (4/37), and an 11 mm right hilar node (4/51) are favored to be reactive. No worrisome enlarged mediastinal, hilar or axillary adenopathy. Lungs/Pleura: Multifocal areas of mixed consolidative and ground-glass opacity with interstitial/septal thickening in a crazy paving pattern which is an often reported imaging feature COVID 19 among other infectious and inflammatory etiologies. No  pneumothorax or effusion. Mild diffuse airways thickening. Upper Abdomen: No acute abnormalities present in the visualized portions of the upper abdomen. Musculoskeletal: No acute osseous abnormality or suspicious osseous lesion. Multilevel degenerative changes are present in the imaged portions of the spine. No worrisome chest wall lesions. Review of the MIP images confirms the above findings. IMPRESSION: 1. No evidence of pulmonary embolism. 2. Multifocal areas of mixed consolidative and ground-glass opacity with interstitial/septal thickening compatible with reported imaging features of COVID 19. 3. Scattered borderline enlarged nodes within the mediastinum and hila, favored to be reactive. Electronically Signed   By: Kreg Shropshire M.D.   On: 10/30/2019 20:59   DG Chest Port 1 View  Result Date: 11/05/2019 CLINICAL DATA:  Shortness of breath, COVID positive EXAM: PORTABLE CHEST 1 VIEW COMPARISON:  Chest radiograph dated 11/01/2019. FINDINGS: The cardiac silhouette is obscured, but appears unchanged. Moderate bilateral airspace opacities appear decreased on the right and unchanged on the left. No large pleural effusion is identified. There is no pneumothorax. The osseous structures are unremarkable. IMPRESSION: Moderate bilateral airspace opacities, decreased on the right and unchanged on the left. Electronically Signed   By: Romona Curls M.D.   On: 11/05/2019 08:45   DG Chest Port 1 View  Result Date: 11/01/2019 CLINICAL DATA:  COVID-19 positive with airspace opacity EXAM: PORTABLE CHEST 1 VIEW COMPARISON:  Chest radiograph and chest CT October 30, 2019 FINDINGS: Airspace opacity is again noted throughout much of the right lung with greatest concentration of opacity in the right mid and lower lung regions. Subtle opacity noted in the left mid and lower lung regions.  Stable cardiac enlargement. Pulmonary vascularity normal. No adenopathy. No bone lesions. IMPRESSION: Stable multifocal airspace opacity,  consistent with known atypical organism pneumonia. No new areas of opacity evident. Stable cardiac prominence. No adenopathy appreciable. Electronically Signed   By: Bretta Bang III M.D.   On: 11/01/2019 08:26   DG Chest Port 1 View  Result Date: 10/30/2019 CLINICAL DATA:  Shortness of breath.  COVID. EXAM: PORTABLE CHEST 1 VIEW COMPARISON:  10/15/2016 FINDINGS: Multifocal airspace opacities throughout the right greater than left lungs. No visible pleural effusions. No pneumothorax. Cardiac silhouette is accentuated by low lung volumes and portable technique. No acute osseous abnormality. IMPRESSION: Multifocal right greater than left airspace opacities, compatible with multifocal pneumonia and reported history of COVID. Electronically Signed   By: Feliberto Harts MD   On: 10/30/2019 19:36   VAS [place] LOWER EXTREMITY VENOUS (DVT)  Result Date: 11/03/2019  Lower Venous DVTStudy Indications: D-dimer.  Anticoagulation: Lovenox. Comparison Study: No prior studies. Performing Technologist: Jean Rosenthal  Examination Guidelines: A complete evaluation includes B-mode imaging, spectral Doppler, color Doppler, and power Doppler as needed of all accessible portions of each vessel. Bilateral testing is considered an integral part of a complete examination. Limited examinations for reoccurring indications may be performed as noted. The reflux portion of the exam is performed with the patient in reverse Trendelenburg.  +---------+---------------+---------+-----------+----------+--------------+  RIGHT     Compressibility Phasicity Spontaneity Properties Thrombus Aging  +---------+---------------+---------+-----------+----------+--------------+  CFV       Full            Yes       Yes                                    +---------+---------------+---------+-----------+----------+--------------+  SFJ       Full                                                              +---------+---------------+---------+-----------+----------+--------------+  FV Prox   Full                                                             +---------+---------------+---------+-----------+----------+--------------+  FV Mid    Full                                                             +---------+---------------+---------+-----------+----------+--------------+  FV Distal Full                                                             +---------+---------------+---------+-----------+----------+--------------+  PFV       Full                                                             +---------+---------------+---------+-----------+----------+--------------+  POP       Full            Yes       Yes                                    +---------+---------------+---------+-----------+----------+--------------+  PTV       Full                                                             +---------+---------------+---------+-----------+----------+--------------+  PERO      Full                                                             +---------+---------------+---------+-----------+----------+--------------+   +---------+---------------+---------+-----------+----------+--------------+  LEFT      Compressibility Phasicity Spontaneity Properties Thrombus Aging  +---------+---------------+---------+-----------+----------+--------------+  CFV       Full            Yes       Yes                                    +---------+---------------+---------+-----------+----------+--------------+  SFJ       Full                                                             +---------+---------------+---------+-----------+----------+--------------+  FV Prox   Full                                                             +---------+---------------+---------+-----------+----------+--------------+  FV Mid    Full                                                              +---------+---------------+---------+-----------+----------+--------------+  FV Distal Full                                                             +---------+---------------+---------+-----------+----------+--------------+  PFV       Full                                                             +---------+---------------+---------+-----------+----------+--------------+  POP       Full            Yes       Yes                                    +---------+---------------+---------+-----------+----------+--------------+  PTV       Full                                                             +---------+---------------+---------+-----------+----------+--------------+  PERO      Full                                                             +---------+---------------+---------+-----------+----------+--------------+     Summary: RIGHT: - There is no evidence of deep vein thrombosis in the lower extremity.  - No cystic structure found in the popliteal fossa.  LEFT: - There is no evidence of deep vein thrombosis in the lower extremity.  - No cystic structure found in the popliteal fossa.  *See table(s) above for measurements and observations. Electronically signed by Coral Else MD on 11/03/2019 at 7:41:49 PM.    Final

## 2019-11-13 ENCOUNTER — Inpatient Hospital Stay (HOSPITAL_COMMUNITY): Payer: HRSA Program

## 2019-11-13 DIAGNOSIS — J1282 Pneumonia due to coronavirus disease 2019: Secondary | ICD-10-CM | POA: Diagnosis not present

## 2019-11-13 DIAGNOSIS — U071 COVID-19: Secondary | ICD-10-CM

## 2019-11-13 DIAGNOSIS — I5031 Acute diastolic (congestive) heart failure: Secondary | ICD-10-CM

## 2019-11-13 LAB — CBC
HCT: 41.4 % (ref 39.0–52.0)
Hemoglobin: 13.4 g/dL (ref 13.0–17.0)
MCH: 31.2 pg (ref 26.0–34.0)
MCHC: 32.4 g/dL (ref 30.0–36.0)
MCV: 96.3 fL (ref 80.0–100.0)
Platelets: 472 10*3/uL — ABNORMAL HIGH (ref 150–400)
RBC: 4.3 MIL/uL (ref 4.22–5.81)
RDW: 11.8 % (ref 11.5–15.5)
WBC: 10.6 10*3/uL — ABNORMAL HIGH (ref 4.0–10.5)
nRBC: 0 % (ref 0.0–0.2)

## 2019-11-13 LAB — BRAIN NATRIURETIC PEPTIDE: B Natriuretic Peptide: 22.4 pg/mL (ref 0.0–100.0)

## 2019-11-13 LAB — COMPREHENSIVE METABOLIC PANEL
ALT: 76 U/L — ABNORMAL HIGH (ref 0–44)
AST: 38 U/L (ref 15–41)
Albumin: 2.8 g/dL — ABNORMAL LOW (ref 3.5–5.0)
Alkaline Phosphatase: 105 U/L (ref 38–126)
Anion gap: 10 (ref 5–15)
BUN: 19 mg/dL (ref 6–20)
CO2: 33 mmol/L — ABNORMAL HIGH (ref 22–32)
Calcium: 8.9 mg/dL (ref 8.9–10.3)
Chloride: 90 mmol/L — ABNORMAL LOW (ref 98–111)
Creatinine, Ser: 1.04 mg/dL (ref 0.61–1.24)
GFR, Estimated: >60 mL/min (ref >60)
Glucose, Bld: 103 mg/dL — ABNORMAL HIGH (ref 70–99)
Potassium: 4.6 mmol/L (ref 3.5–5.1)
Sodium: 133 mmol/L — ABNORMAL LOW (ref 135–145)
Total Bilirubin: 1 mg/dL (ref 0.3–1.2)
Total Protein: 7.3 g/dL (ref 6.5–8.1)

## 2019-11-13 LAB — ECHOCARDIOGRAM LIMITED
Area-P 1/2: 3.53 cm2
Height: 69 in
S' Lateral: 2.6 cm
Weight: 4604.8 oz

## 2019-11-13 LAB — D-DIMER, QUANTITATIVE: D-Dimer, Quant: 0.87 ug/mL-FEU — ABNORMAL HIGH (ref 0.00–0.50)

## 2019-11-13 LAB — GLUCOSE, CAPILLARY
Glucose-Capillary: 91 mg/dL (ref 70–99)
Glucose-Capillary: 152 mg/dL — ABNORMAL HIGH (ref 70–99)
Glucose-Capillary: 149 mg/dL — ABNORMAL HIGH (ref 70–99)
Glucose-Capillary: 181 mg/dL — ABNORMAL HIGH (ref 70–99)

## 2019-11-13 MED ORDER — ENOXAPARIN SODIUM 60 MG/0.6ML ~~LOC~~ SOLN
60.0000 mg | SUBCUTANEOUS | Status: DC
  Administered 2019-11-14: 60 mg via SUBCUTANEOUS
  Filled 2019-11-13: qty 0.6

## 2019-11-13 MED ORDER — MAGNESIUM SULFATE IN D5W 1-5 GM/100ML-% IV SOLN
1.0000 g | Freq: Once | INTRAVENOUS | Status: AC
  Administered 2019-11-13: 1 g via INTRAVENOUS
  Filled 2019-11-13: qty 100

## 2019-11-13 NOTE — Progress Notes (Signed)
PROGRESS NOTE                                                                                                                                                                                                             Patient Demographics:    Brandon Snow, is a 59 y.o. male, DOB - [birth year], JWJ:[id number]  Outpatient Primary MD for the patient is Patient, No Pcp Per    LOS - 13  Admit date - 10/30/2019    Chief Complaint  Patient presents with  . Cough  . Shortness of Breath       Brief Narrative  -  Brandon Snow is a 59 y.o. male with medical history significant of hypertension presented to the ED on 9/29 via EMS for evaluation of shortness of breath and cough.  Oxygen saturation 77% on room air, there he was diagnosed with acute hypoxic respiratory failure due to COVID-19 pneumonia and ARDS.  He was admitted to the hospital and placed on heated high flow.   Subjective:   Patient in bed, appears comfortable, denies any headache, no fever, no chest pain or pressure, improving shortness of breath , no abdominal pain. No focal weakness.    Assessment  & Plan :    Acute Hypoxic Resp. Failure due to Acute Covid 19 Viral Pneumonitis during the ongoing 2020 Covid 19 Pandemic  - he is unfortunately unvaccinated and has incurred severe parenchymal lung injury with ARDS due to COVID-19 pneumonia,  -Continue with IV steroids . -Treated with IV remdesivir . -Treated with baricitinib.  . -Was encouraged to use incentive spirometry, flutter valve, out of bed to chair . -Continues to improve gradually.  SpO2: 91 % O2 Flow Rate (L/min): 3 L/min FiO2 (%): 90 %   Recent Labs  Lab 11/07/19 0144 11/07/19 0144 11/08/19 0454 11/08/19 0454 11/09/19 0408 11/10/19 0500 11/11/19 0358 11/12/19 0220 11/13/19 0247  WBC 11.7*   < > 10.2   < > 8.7 10.2 9.8 10.0 10.6*  PLT 470*   < > 470*   < > 500* 498* 504* 539* 472*    CRP 20.7*  --  18.8*  --  11.8*  --   --   --   --   BNP 28.9  --  34.1  --   --   --   --   --  22.4  DDIMER 3.07*   < > 4.20*   < > 2.70* 2.10* 1.54* 1.22* 0.87*  PROCALCITON 0.17  --  0.20  --   --   --   --   --   --   AST 34   < > 32   < > 85* 42* 37 35 38  ALT 47*   < > 46*   < > 95* 91* 87* 90* 76*  ALKPHOS 108   < > 109   < > 124 110 117 115 105  BILITOT 1.3*   < > 1.3*   < > 1.1 0.4 0.5 0.7 1.0  ALBUMIN 2.5*   < > 2.4*   < > 2.6* 2.5* 2.7* 2.9* 2.8*   < > = values in this interval not displayed.     Asymptomatic transaminitis due to viral infection.  Trending down, continue to monitor  Smoking.  Counseled to quit.  Stress-induced hypertension.  On as needed labetalol.  Monitor.  High D-dimer due to inflammation.  Negative CT angiogram chest and leg ultrasound.  Continue moderate dose Lovenox.  D-dimer now trending down.    Mild epistaxis .  Resolved, will DC Afrin and encouraged to use normal saline nasal spray   Oral thrush - Started on Magic mouthwash     Condition -  Fair  Family Communication  : [name] - [phone] on 11/12/2019  Code Status :  Full  Consults  :  None  Procedures  :    CTA - no PE  Leg [place] - No DVT   PUD Prophylaxis : PPI  Disposition Plan  :    Status is: Inpatient  Remains inpatient appropriate because:Hemodynamically unstable and IV treatments appropriate due to intensity of illness or inability to take PO   Dispo: The patient is from: Home              Anticipated d/c is to: Home              Anticipated d/c date is: > 3 days              Patient currently is not medically stable to d/c.  DVT Prophylaxis  :  Lovenox   Lab Results  Component Value Date   PLT 472 (H) 11/13/2019    Diet :  Diet Order            Diet Heart Room service appropriate? Yes; Fluid consistency: Thin  Diet effective now                  Inpatient Medications  Scheduled Meds: . vitamin C  500 mg Oral Daily  .  cholecalciferol  1,000 Units Oral Daily  . enoxaparin (LOVENOX) injection  60 mg Subcutaneous Q12H  . influenza vac split quadrivalent PF  0.5 mL Intramuscular Tomorrow-1000  . insulin aspart  0-15 Units Subcutaneous TID WC  . insulin aspart  0-5 Units Subcutaneous QHS  . insulin aspart  3 Units Subcutaneous TID WC  . magic mouthwash  10 mL Oral QID  . methylPREDNISolone (SOLU-MEDROL) injection  80 mg Intravenous Q12H  . metoprolol tartrate  50 mg Oral BID  . nicotine  14 mg Transdermal Daily  . pantoprazole  40 mg Oral Daily  . pneumococcal 23 valent vaccine  0.5 mL Intramuscular Tomorrow-1000  . senna-docusate  2 tablet Oral BID  . zinc sulfate  220 mg Oral Daily   Continuous Infusions: . sodium chloride Stopped (10/31/19  1114)   PRN Meds:.sodium chloride, acetaminophen, albuterol, chlorpheniramine-HYDROcodone, guaiFENesin-dextromethorphan, labetalol, lip balm, sodium chloride  Antibiotics  :    Anti-infectives (From admission, onward)   Start     Dose/Rate Route Frequency Ordered Stop   10/31/19 1000  remdesivir 100 mg in sodium chloride 0.9 % 100 mL IVPB        100 mg 200 mL/hr over 30 Minutes Intravenous Daily 10/30/19 1851 11/03/19 1124   10/30/19 1930  remdesivir 100 mg in sodium chloride 0.9 % 100 mL IVPB       "Followed by" Linked Group Details   100 mg 200 mL/hr over 30 Minutes Intravenous  Once 10/30/19 1851 10/30/19 2106   10/30/19 1900  remdesivir 100 mg in sodium chloride 0.9 % 100 mL IVPB       "Followed by" Linked Group Details   100 mg 200 mL/hr over 30 Minutes Intravenous  Once 10/30/19 1851 10/30/19 2106       Susa Raring M.D on 11/13/2019 at 9:54 AM  To page go to [email or web address]   [facility] -  Office  [phone]     See all Orders from today for further details    Objective:   Vitals:   11/12/19 2026 11/12/19 2312 11/13/19 0419 11/13/19 0743  BP: 116/64 120/75 133/75 119/68  Pulse: 97 80 77 96  Resp: 20 20 20 20   Temp: 98.1 F  (36.7 C) 98.6 F (37 C) 98.2 F (36.8 C) 98.3 F (36.8 C)  TempSrc: Oral Axillary Axillary Oral  SpO2: 93% 96% 98% 91%  Weight:      Height:        Wt Readings from Last 3 Encounters:  10/31/19 130.5 kg  12/22/18 124.7 kg  10/15/16 124.7 kg     Intake/Output Summary (Last 24 hours) at 11/13/2019 0954 Last data filed at 11/13/2019 0747 Gross per 24 hour  Intake 300 ml  Output 2100 ml  Net -1800 ml     Physical Exam  Awake Alert, No new F.N deficits, Normal affect [place].AT,PERRAL Supple Neck,No JVD, No cervical lymphadenopathy appriciated.  Symmetrical Chest wall movement, Good air movement bilaterally, CTAB RRR,No Gallops, Rubs or new Murmurs, No Parasternal Heave +ve B.Sounds, Abd Soft, No tenderness, No organomegaly appriciated, No rebound - guarding or rigidity. No Cyanosis, Clubbing or edema, No new Rash or bruise     Data Review:    CBC Recent Labs  Lab 11/07/19 0144 11/07/19 0144 11/08/19 0454 11/08/19 0454 11/09/19 0408 11/10/19 0500 11/11/19 0358 11/12/19 0220 11/13/19 0247  WBC 11.7*   < > 10.2   < > 8.7 10.2 9.8 10.0 10.6*  HGB 14.5   < > 13.7   < > 14.1 13.7 13.2 13.8 13.4  HCT 42.8   < > 40.8   < > 42.2 41.3 40.7 41.1 41.4  PLT 470*   < > 470*   < > 500* 498* 504* 539* 472*  MCV 95.5   < > 95.3   < > 96.6 98.1 96.9 96.0 96.3  MCH 32.4   < > 32.0   < > 32.3 32.5 31.4 32.2 31.2  MCHC 33.9   < > 33.6   < > 33.4 33.2 32.4 33.6 32.4  RDW 11.9   < > 11.8   < > 11.7 11.9 11.7 11.7 11.8  LYMPHSABS 1.2  --  1.3  --  1.0  --   --   --   --   MONOABS 1.0  --  0.8  --  0.8  --   --   --   --   EOSABS 0.2  --  0.2  --  0.0  --   --   --   --   BASOSABS 0.0  --  0.0  --  0.0  --   --   --   --    < > = values in this interval not displayed.    Recent Labs  Lab 11/07/19 0144 11/07/19 0144 11/08/19 0454 11/08/19 0454 11/09/19 0408 11/10/19 0500 11/11/19 0358 11/12/19 0220 11/13/19 0247  NA 132*   < > 131*   < > 132* 132* 133* 133* 133*  K 4.0    < > 3.8   < > 4.4 4.5 4.4 4.0 4.6  CL 94*   < > 92*   < > 93* 94* 92* 90* 90*  CO2 24   < > 28   < > 28 32 30 31 33*  GLUCOSE 91   < > 109*   < > 125* 178* 128* 175* 103*  BUN 20   < > 20   < > CREATININE 1.11   < > 1.15   < > 1.05 1.14 1.12 1.05 1.04  CALCIUM 9.0   < > 9.0   < > 8.8* 9.0 9.1 9.1 8.9  AST 34   < > 32   < > 85* 42* 37 35 38  ALT 47*   < > 46*   < > 95* 91* 87* 90* 76*  ALKPHOS 108   < > 109   < > 124 110 117 115 105  BILITOT 1.3*   < > 1.3*   < > 1.1 0.4 0.5 0.7 1.0  ALBUMIN 2.5*   < > 2.4*   < > 2.6* 2.5* 2.7* 2.9* 2.8*  MG 2.3  --  2.4  --   --   --   --   --   --   CRP 20.7*  --  18.8*  --  11.8*  --   --   --   --   DDIMER 3.07*   < > 4.20*   < > 2.70* 2.10* 1.54* 1.22* 0.87*  PROCALCITON 0.17  --  0.20  --   --   --   --   --   --   BNP 28.9  --  34.1  --   --   --   --   --  22.4   < > = values in this interval not displayed.    ------------------------------------------------------------------------------------------------------------------ No results for input(s): CHOL, HDL, LDLCALC, TRIG, CHOLHDL, LDLDIRECT in the last 72 hours.  Lab Results  Component Value Date   HGBA1C 5.6 11/01/2019   ------------------------------------------------------------------------------------------------------------------ No results for input(s): TSH, T4TOTAL, T3FREE, THYROIDAB in the last 72 hours.  Invalid input(s): FREET3  Cardiac Enzymes No results for input(s): CKMB, TROPONINI, MYOGLOBIN in the last 168 hours.  Invalid input(s): CK ------------------------------------------------------------------------------------------------------------------    Component Value Date/Time   BNP 22.4 11/13/2019 0247    Micro Results No results found for this or any previous visit (from the past 240 hour(s)).  Radiology Reports CT Angio Chest PE W and/or Wo Contrast  Result Date: 10/30/2019 CLINICAL DATA:  COVID-19 positive, worsening shortness of breath at  home, O2 sat 77% on room air EXAM: CT ANGIOGRAPHY CHEST WITH CONTRAST TECHNIQUE: Multidetector CT imaging of the chest was performed using the standard protocol during bolus administration of intravenous  contrast. Multiplanar CT image reconstructions and MIPs were obtained to evaluate the vascular anatomy. CONTRAST:  OMNIPAQUE IOHEXOL 350 MG/ML SOLN COMPARISON:  Radiograph 10/30/2019 FINDINGS: Cardiovascular: Satisfactory opacification the pulmonary arteries to the segmental level. No pulmonary artery filling defects are identified. Central pulmonary arteries are normal caliber. The aortic root is suboptimally assessed given cardiac pulsation artifact. The aorta is normal caliber. No acute luminal abnormality of the imaged aorta. No periaortic stranding or hemorrhage. Normal 3 vessel branching of the aortic arch. Proximal great vessels are unremarkable. Normal heart size. No pericardial effusion. No major venous abnormalities. Mediastinum/Nodes: No mediastinal fluid or gas. Normal thyroid gland and thoracic inlet. No acute abnormality of the trachea or esophagus. Scattered borderline enlarged nodes with preserved nodal architecture within the mediastinum and hila for instance a 12 mm left paratracheal node (4/37), 10 mm right paratracheal node (4/37), and an 11 mm right hilar node (4/51) are favored to be reactive. No worrisome enlarged mediastinal, hilar or axillary adenopathy. Lungs/Pleura: Multifocal areas of mixed consolidative and ground-glass opacity with interstitial/septal thickening in a crazy paving pattern which is an often reported imaging feature COVID 19 among other infectious and inflammatory etiologies. No pneumothorax or effusion. Mild diffuse airways thickening. Upper Abdomen: No acute abnormalities present in the visualized portions of the upper abdomen. Musculoskeletal: No acute osseous abnormality or suspicious osseous lesion. Multilevel degenerative changes are present in the imaged  portions of the spine. No worrisome chest wall lesions. Review of the MIP images confirms the above findings. IMPRESSION: 1. No evidence of pulmonary embolism. 2. Multifocal areas of mixed consolidative and ground-glass opacity with interstitial/septal thickening compatible with reported imaging features of COVID 19. 3. Scattered borderline enlarged nodes within the mediastinum and hila, favored to be reactive. Electronically Signed   By: Kreg Shropshire M.D.   On: 10/30/2019 20:59   DG Chest Port 1 View  Result Date: 11/05/2019 CLINICAL DATA:  Shortness of breath, COVID positive EXAM: PORTABLE CHEST 1 VIEW COMPARISON:  Chest radiograph dated 11/01/2019. FINDINGS: The cardiac silhouette is obscured, but appears unchanged. Moderate bilateral airspace opacities appear decreased on the right and unchanged on the left. No large pleural effusion is identified. There is no pneumothorax. The osseous structures are unremarkable. IMPRESSION: Moderate bilateral airspace opacities, decreased on the right and unchanged on the left. Electronically Signed   By: Romona Curls M.D.   On: 11/05/2019 08:45   DG Chest Port 1 View  Result Date: 11/01/2019 CLINICAL DATA:  COVID-19 positive with airspace opacity EXAM: PORTABLE CHEST 1 VIEW COMPARISON:  Chest radiograph and chest CT October 30, 2019 FINDINGS: Airspace opacity is again noted throughout much of the right lung with greatest concentration of opacity in the right mid and lower lung regions. Subtle opacity noted in the left mid and lower lung regions. Stable cardiac enlargement. Pulmonary vascularity normal. No adenopathy. No bone lesions. IMPRESSION: Stable multifocal airspace opacity, consistent with known atypical organism pneumonia. No new areas of opacity evident. Stable cardiac prominence. No adenopathy appreciable. Electronically Signed   By: Bretta Bang III M.D.   On: 11/01/2019 08:26   DG Chest Port 1 View  Result Date: 10/30/2019 CLINICAL DATA:   Shortness of breath.  COVID. EXAM: PORTABLE CHEST 1 VIEW COMPARISON:  10/15/2016 FINDINGS: Multifocal airspace opacities throughout the right greater than left lungs. No visible pleural effusions. No pneumothorax. Cardiac silhouette is accentuated by low lung volumes and portable technique. No acute osseous abnormality. IMPRESSION: Multifocal right greater than left airspace opacities, compatible with  multifocal pneumonia and reported history of COVID. Electronically Signed   By: Feliberto HartsFrederick S Jones MD   On: 10/30/2019 19:36   VAS US LOWER EXTREMITY VENOUS (DVT)  Result Date: 11/03/2019  Lower Venous DVTStudy Indications: D-dimer.  Anticoagulation: Lovenox. Comparison Study: No prior studies. Performing Technologist: Jean Rosenthalachel Hodge  Examination Guidelines: A complete evaluation includes B-mode imaging, spectral Doppler, color Doppler, and power Doppler as needed of all accessible portions of each vessel. Bilateral testing is considered an integral part of a complete examination. Limited examinations for reoccurring indications may be performed as noted. The reflux portion of the exam is performed with the patient in reverse Trendelenburg.  +---------+---------------+---------+-----------+----------+--------------+ RIGHT    CompressibilityPhasicitySpontaneityPropertiesThrombus Aging +---------+---------------+---------+-----------+----------+--------------+ CFV      Full           Yes      Yes                                 +---------+---------------+---------+-----------+----------+--------------+ SFJ      Full                                                        +---------+---------------+---------+-----------+----------+--------------+ FV Prox  Full                                                        +---------+---------------+---------+-----------+----------+--------------+ FV Mid   Full                                                         +---------+---------------+---------+-----------+----------+--------------+ FV DistalFull                                                        +---------+---------------+---------+-----------+----------+--------------+ PFV      Full                                                        +---------+---------------+---------+-----------+----------+--------------+ POP      Full           Yes      Yes                                 +---------+---------------+---------+-----------+----------+--------------+ PTV      Full                                                        +---------+---------------+---------+-----------+----------+--------------+  PERO     Full                                                        +---------+---------------+---------+-----------+----------+--------------+   +---------+---------------+---------+-----------+----------+--------------+ LEFT     CompressibilityPhasicitySpontaneityPropertiesThrombus Aging +---------+---------------+---------+-----------+----------+--------------+ CFV      Full           Yes      Yes                                 +---------+---------------+---------+-----------+----------+--------------+ SFJ      Full                                                        +---------+---------------+---------+-----------+----------+--------------+ FV Prox  Full                                                        +---------+---------------+---------+-----------+----------+--------------+ FV Mid   Full                                                        +---------+---------------+---------+-----------+----------+--------------+ FV DistalFull                                                        +---------+---------------+---------+-----------+----------+--------------+ PFV      Full                                                         +---------+---------------+---------+-----------+----------+--------------+ POP      Full           Yes      Yes                                 +---------+---------------+---------+-----------+----------+--------------+ PTV      Full                                                        +---------+---------------+---------+-----------+----------+--------------+ PERO     Full                                                        +---------+---------------+---------+-----------+----------+--------------+  Summary: RIGHT: - There is no evidence of deep vein thrombosis in the lower extremity.  - No cystic structure found in the popliteal fossa.  LEFT: - There is no evidence of deep vein thrombosis in the lower extremity.  - No cystic structure found in the popliteal fossa.  *See table(s) above for measurements and observations. Electronically signed by Coral Else MD on 11/03/2019 at 7:41:49 PM.    Final

## 2019-11-13 NOTE — Progress Notes (Signed)
  Echocardiogram 2D Echocardiogram has been performed.  Delcie Roch 11/13/2019, 4:58 PM

## 2019-11-13 NOTE — TOC Progression Note (Signed)
Transition of Care [facility]) - Progression Note    Patient Details  Name: Brandon Snow MRN: [id number] Date of Birth: 13-Oct-1960  [facility] CM/SW Contact  Nance Pear, RN Phone Number: 11/13/2019, 2:16 PM  Clinical Narrative:    Case manager spoke to patient on phone due to COVID regarding TOC to home.  Prior to admission patient says that they were independent of all ADLs, lives with wife, has son.  Denies having any current DMEs at home.  Verified does not have insurance or PCP.  Verified address.  Placed MATCH/TOC pharmacy. Patient is unable to afford needs, requested 3 in 1 commode and rolling walker charity from [employer], Velna Hatchet; walking sats are in computer, requested charity oxygen from [employer], Ian Malkin.  TOC following for progression of care needs.   Expected Discharge Plan: Home/Self Care Barriers to Discharge: No Barriers Identified  Expected Discharge Plan and Services Expected Discharge Plan: Home/Self Care   Discharge Planning Services: CM Consult   Living arrangements for the past 2 months: Single Family Home                 DME Arranged: Oxygen, 3-N-1, Walker rolling DME Agency: [employer] Date DME Agency Contacted: 11/13/19 Time DME Agency Contacted: 1400 Representative spoke with at DME Agency: Velna Hatchet for DME,  Zach for oxygen, charity case             Social Determinants of Health (SDOH) Interventions    Readmission Risk Interventions No flowsheet data found.

## 2019-11-13 NOTE — Progress Notes (Addendum)
Occupational Therapy Treatment Patient Details Name: Brandon Snow MRN: [id number] DOB: [birth year] Today's Date: 11/13/2019    History of present illness Pt is a 59 y.o. male admitted 10/30/19 with SOB and cough; workup for acute hypoxic respiratory failure due to COVID-19 pneumonia and ARDS. Negative for PE. PMH includes HTN; pt unvaccinated.   OT comments  Pt received sitting in recliner very pleasant and motivated to work with therapy. Pt tolerating completion of room and hallway level mobility using rollator, performing at supervision level. Pt completing activity on 4L O2 throughout with SpO2 >/=89% but with intermittent increase to 3/4 DOE. Min cues provided for initiating standing rest breaks. SpO2 well into the 90s after return to room/sitting therefore titrated O2 down to 3L with SpO2 maintaining >92% while seated. Pt performing LB ADL with minguard assist. Continued discussion/reinforcement of energy conservation and deep breathing techniques provided with pt verbalizing good understanding. Will continue per POC at this time.    Follow Up Recommendations  No OT follow up;Supervision/Assistance - 24 hour    Equipment Recommendations  Tub/shower seat          Precautions / Restrictions Precautions Precautions: Fall Precaution Comments: watch sats       Mobility Bed Mobility               General bed mobility comments: up in recliner on entry  Transfers Overall transfer level: Independent Equipment used: None Transfers: Sit to/from Stand                Balance Overall balance assessment: Needs assistance Sitting-balance support: Feet supported;No upper extremity supported Sitting balance-Leahy Scale: Good     Standing balance support: Bilateral upper extremity supported;No upper extremity supported;During functional activity Standing balance-Leahy Scale: Fair                             ADL either performed or assessed with clinical  judgement   ADL Overall ADL's : Needs assistance/impaired                     Lower Body Dressing: Min guard;Sit to/from stand Lower Body Dressing Details (indicate cue type and reason): for socks              Functional mobility during ADLs: Supervision/safety (rollator ) General ADL Comments: focus of session on functional mobility and progressing endurance/distance. pt tolerating room and hallway level mobility using rollator, min cues for taking standing rest breaks                        Cognition Arousal/Alertness: Awake/alert Behavior During Therapy: WFL for tasks assessed/performed Overall Cognitive Status: Within Functional Limits for tasks assessed                                 General Comments: very motivated        Exercises Exercises: Other exercises Other Exercises Other Exercises: encouraged LE exercises during the day as another method to increasing strength/endurance    Shoulder Instructions       General Comments      Pertinent Vitals/ Pain       Pain Assessment: No/denies pain  Home Living  Prior Functioning/Environment              Frequency  Min 2X/week        Progress Toward Goals  OT Goals(current goals can now be found in the care plan section)  Progress towards OT goals: Progressing toward goals  Acute Rehab OT Goals Patient Stated Goal: Walk more and return home OT Goal Formulation: With patient Time For Goal Achievement: 11/17/19 Potential to Achieve Goals: Good ADL Goals Pt Will Perform Grooming: with supervision;standing Pt Will Perform Upper Body Bathing: with set-up;sitting Pt Will Perform Lower Body Bathing: with supervision;sit to/from stand Pt Will Perform Upper Body Dressing: with set-up;sitting Pt Will Perform Lower Body Dressing: with supervision;sit to/from stand Pt Will Transfer to Toilet: with  supervision;ambulating;regular height toilet;bedside commode;grab bars Pt Will Perform Toileting - Clothing Manipulation and hygiene: with supervision;sit to/from stand Pt Will Perform Tub/Shower Transfer: Tub transfer;with supervision;ambulating;shower seat;tub bench Pt/caregiver will Perform Home Exercise Program: Increased strength;Right Upper extremity;Left upper extremity;With theraband;Independently Additional ADL Goal #1: Pt will tolerate 25 mins therapeutic activity with no more than 4 rest breaks and DOE no >3/4 Additional ADL Goal #2: Pt will independently incorporate 3 energy conservation techniques into daily activities  Plan Discharge plan remains appropriate    Co-evaluation                 AM-PAC OT "6 Clicks" Daily Activity     Outcome Measure   Help from another person eating meals?: None Help from another person taking care of personal grooming?: A Little Help from another person toileting, which includes using toliet, bedpan, or urinal?: A Little Help from another person bathing (including washing, rinsing, drying)?: A Lot Help from another person to put on and taking off regular upper body clothing?: A Little Help from another person to put on and taking off regular lower body clothing?: A Little 6 Click Score: 18    End of Session Equipment Utilized During Treatment: Oxygen (rollator )  OT Visit Diagnosis: Unsteadiness on feet (R26.81)   Activity Tolerance Patient tolerated treatment well   Patient Left in chair;with call bell/phone within reach;with nursing/sitter in room (NT in room)   Nurse Communication Mobility status        Time: 0272-5366 OT Time Calculation (min): 31 min  Charges: OT General Charges $OT Visit: 1 Visit OT Treatments $Therapeutic Activity: 23-37 mins  Marcy Siren, OT Acute Rehabilitation Services Pager [phone] Office [phone]   [name] 11/13/2019, 11:55 AM

## 2019-11-13 NOTE — Progress Notes (Signed)
Ddimer<1 now, ok to reduce lovenox to 0.5mg /kg/day per Dr. Thedore Mins.  Ulyses Southward, PharmD, BCIDP, AAHIVP, CPP Infectious Disease Pharmacist 11/13/2019 12:54 PM

## 2019-11-13 NOTE — Progress Notes (Signed)
SATURATION QUALIFICATIONS: (This note is used to comply with regulatory documentation for home oxygen)  Patient Saturations on Room Air at Rest = 86%  Patient Saturations on Room Air while Ambulating = 81%  Patient Saturations on 4Liters of oxygen while Ambulating = 90%  Please briefly explain why patient needs home oxygen:

## 2019-11-14 DIAGNOSIS — J1282 Pneumonia due to coronavirus disease 2019: Secondary | ICD-10-CM

## 2019-11-14 DIAGNOSIS — U071 COVID-19: Principal | ICD-10-CM

## 2019-11-14 LAB — CBC
HCT: 42.3 % (ref 39.0–52.0)
Hemoglobin: 14.1 g/dL (ref 13.0–17.0)
MCH: 31.8 pg (ref 26.0–34.0)
MCHC: 33.3 g/dL (ref 30.0–36.0)
MCV: 95.5 fL (ref 80.0–100.0)
Platelets: 258 10*3/uL (ref 150–400)
RBC: 4.43 MIL/uL (ref 4.22–5.81)
RDW: 11.9 % (ref 11.5–15.5)
WBC: 8.7 10*3/uL (ref 4.0–10.5)
nRBC: 0 % (ref 0.0–0.2)

## 2019-11-14 LAB — GLUCOSE, CAPILLARY
Glucose-Capillary: 133 mg/dL — ABNORMAL HIGH (ref 70–99)
Glucose-Capillary: 168 mg/dL — ABNORMAL HIGH (ref 70–99)

## 2019-11-14 LAB — D-DIMER, QUANTITATIVE: D-Dimer, Quant: 0.81 ug/mL-FEU — ABNORMAL HIGH (ref 0.00–0.50)

## 2019-11-14 MED ORDER — BENZONATATE 100 MG PO CAPS: 200.0000 mg | ORAL_CAPSULE | Freq: Two times a day (BID) | ORAL | 0 refills | Status: DC | PRN

## 2019-11-14 MED ORDER — ALBUTEROL SULFATE HFA 108 (90 BASE) MCG/ACT IN AERS
2.0000 | INHALATION_SPRAY | Freq: Four times a day (QID) | RESPIRATORY_TRACT | 0 refills | Status: DC | PRN
Start: 2019-11-14 — End: 2020-06-22

## 2019-11-14 MED ORDER — APIXABAN 2.5 MG PO TABS: 2.5000 mg | ORAL_TABLET | Freq: Two times a day (BID) | ORAL | 0 refills | Status: DC

## 2019-11-14 MED ORDER — METOPROLOL TARTRATE 50 MG PO TABS
50.0000 mg | ORAL_TABLET | Freq: Two times a day (BID) | ORAL | 0 refills | Status: DC
Start: 2019-11-14 — End: 2022-12-07

## 2019-11-14 MED ORDER — PREDNISONE 5 MG PO TABS: ORAL_TABLET | ORAL | 0 refills | Status: DC

## 2019-11-14 NOTE — Progress Notes (Addendum)
Physical Therapy Treatment Patient Details Name: Brandon Snow MRN: [id number] DOB: [birth year] Today's Date: 11/14/2019    History of Present Illness Pt is a 59 y.o. male admitted 10/30/19 with SOB and cough; workup for acute hypoxic respiratory failure due to COVID-19 pneumonia and ARDS. Negative for PE. PMH includes HTN; pt unvaccinated.    PT Comments    Pt received in recliner on 3L O2 SpO2 92%. Initiated gait training on 4L with rollator, desat to 80% and HR 127 after 10'. 4-5 minute seated rest break to recover to 90%. Pt then ambulated on 6L 80' with rollator. Short term desat to 85%, but stay 87-90% majority of distance. Upon return to recliner, SpO2 90% on 3L. Pt reports feeling anxious and excited about going home resulting in is difficulty with ambulation on first attempt. Educated on need to stay in a calm environment, minimizing visitors and surrounding activity due to pt tends to experience difficulty breathing when too much is going on. Pt has 1 flight of stairs to enter apartment. Advised pt to walk from car to stairs then sit and rest prior to ascending steps. Pt advised to take as many seated rest breaks as needed to ascend flight of stairs to apartment. Verbalized understanding.    Follow Up Recommendations  Home health PT     Equipment Recommendations  Other (comment) (rollator)    Recommendations for Other Services       Precautions / Restrictions Precautions Precautions: Fall;Other (comment) Precaution Comments: watch sats and HR Restrictions Weight Bearing Restrictions: No    Mobility  Bed Mobility               General bed mobility comments: up in recliner on entry  Transfers Overall transfer level: Independent Equipment used: None Transfers: Sit to/from Stand Sit to Stand: Independent            Ambulation/Gait Ambulation/Gait assistance: Supervision Gait Distance (Feet): 80 Feet Assistive device: 4-wheeled walker Gait  Pattern/deviations: Step-through pattern;Decreased stride length Gait velocity: Decreased Gait velocity interpretation: <1.8 ft/sec, indicate of risk for recurrent falls General Gait Details: slow, guarded gait with focus on controlled breathing. Amb on 6L desat to 85%. HR 121.   Stairs             Wheelchair Mobility    Modified Rankin (Stroke Patients Only)       Balance Overall balance assessment: Needs assistance Sitting-balance support: Feet supported;No upper extremity supported Sitting balance-Leahy Scale: Good     Standing balance support: Bilateral upper extremity supported;No upper extremity supported;During functional activity Standing balance-Leahy Scale: Fair Standing balance comment: RW for hallway amb                            Cognition Arousal/Alertness: Awake/alert Behavior During Therapy: WFL for tasks assessed/performed Overall Cognitive Status: Within Functional Limits for tasks assessed                                        Exercises      General Comments General comments (skin integrity, edema, etc.): SpO2 92% at rest on 3L. Mobilized initially on 4L with desat to 80% and HR 127. O2 increased to 6L with SpO2 85% and HR 121. Recovery to 90% at end of session in recliner on 3L. HR 99.      Pertinent Vitals/Pain Pain Assessment: No/denies  pain    Home Living                      Prior Function            PT Goals (current goals can now be found in the care plan section) Acute Rehab PT Goals Patient Stated Goal: home Progress towards PT goals: Progressing toward goals    Frequency    Min 3X/week      PT Plan Current plan remains appropriate    Co-evaluation              AM-PAC PT "6 Clicks" Mobility   Outcome Measure  Help needed turning from your back to your side while in a flat bed without using bedrails?: None Help needed moving from lying on your back to sitting on the side  of a flat bed without using bedrails?: None Help needed moving to and from a bed to a chair (including a wheelchair)?: None Help needed standing up from a chair using your arms (e.g., wheelchair or bedside chair)?: None Help needed to walk in hospital room?: A Little Help needed climbing 3-5 steps with a railing? : A Little 6 Click Score: 22    End of Session Equipment Utilized During Treatment: Oxygen Activity Tolerance: Patient tolerated treatment well Patient left: in chair;with call bell/phone within reach Nurse Communication: Mobility status PT Visit Diagnosis: Unsteadiness on feet (R26.81);Difficulty in walking, not elsewhere classified (R26.2);Muscle weakness (generalized) (M62.81)     Time: 2376-2831 PT Time Calculation (min) (ACUTE ONLY): 29 min  Charges:  $Gait Training: 23-37 mins                     Brandon Snow, [place]  Office # [phone] Pager [phone]    Ilda Foil 11/14/2019, 11:11 AM

## 2019-11-14 NOTE — Discharge Instructions (Signed)
Dr Susa RaringPrashant Singh,   office of patient experience -  [email or web address]   &   [email or web address]  Follow with Primary MD in 7 days   Get CBC, CMP, 2 view Chest X ray -  checked next visit within 1 week by Primary MD   Activity: As tolerated with Full fall precautions use walker/cane & assistance as needed  Disposition Home    Diet: Heart Healthy   Special Instructions: If you have smoked or chewed Tobacco  in the last 2 yrs please stop smoking, stop any regular Alcohol  and or any Recreational drug use.  On your next visit with your primary care physician please Get Medicines reviewed and adjusted.  Please request your Prim.MD to go over all Hospital Tests and Procedure/Radiological results at the follow up, please get all Hospital records sent to your Prim MD by signing hospital release before you go home.  If you experience worsening of your admission symptoms, develop shortness of breath, life threatening emergency, suicidal or homicidal thoughts you must seek medical attention immediately by calling 911 or calling your MD immediately  if symptoms less severe.  You Must read complete instructions/literature along with all the possible adverse reactions/side effects for all the Medicines you take and that have been prescribed to you. Take any new Medicines after you have completely understood and accpet all the possible adverse reactions/side effects.           Person Under Monitoring Name: Brandon Snow  Location: [id number] [place] [id number]   Infection Prevention Recommendations for Individuals Confirmed to have, or Being Evaluated for, 2019 Novel Coronavirus (COVID-19) Infection Who Receive Care at Home  Individuals who are confirmed to have, or are being evaluated for, COVID-19 should follow the prevention steps below until a healthcare provider or local or state health department says they can return to normal activities.  Stay  home except to get medical care You should restrict activities outside your home, except for getting medical care. Do not go to work, school, or public areas, and do not use public transportation or taxis.  Call ahead before visiting your doctor Before your medical appointment, call the healthcare provider and tell them that you have, or are being evaluated for, COVID-19 infection. This will help the healthcare provider's office take steps to keep other people from getting infected. Ask your healthcare provider to call the local or state health department.  Monitor your symptoms Seek prompt medical attention if your illness is worsening (e.g., difficulty breathing). Before going to your medical appointment, call the healthcare provider and tell them that you have, or are being evaluated for, COVID-19 infection. Ask your healthcare provider to call the local or state health department.  Wear a facemask You should wear a facemask that covers your nose and mouth when you are in the same room with other people and when you visit a healthcare provider. People who live with or visit you should also wear a facemask while they are in the same room with you.  Separate yourself from other people in your home As much as possible, you should stay in a different room from other people in your home. Also, you should use a separate bathroom, if available.  Avoid sharing household items You should not share dishes, drinking glasses, cups, eating utensils, towels, bedding, or other items with other people in your home. After using these items, you should wash them thoroughly with soap and water.  Cover your coughs  and sneezes Cover your mouth and nose with a tissue when you cough or sneeze, or you can cough or sneeze into your sleeve. Throw used tissues in a lined trash can, and immediately wash your hands with soap and water for at least 20 seconds or use an alcohol-based hand rub.  Wash your  [employer] your hands often and thoroughly with soap and water for at least 20 seconds. You can use an alcohol-based hand sanitizer if soap and water are not available and if your hands are not visibly dirty. Avoid touching your eyes, nose, and mouth with unwashed hands.   Prevention Steps for Caregivers and Household Members of Individuals Confirmed to have, or Being Evaluated for, COVID-19 Infection Being Cared for in the Home  If you live with, or provide care at home for, a person confirmed to have, or being evaluated for, COVID-19 infection please follow these guidelines to prevent infection:  Follow healthcare provider's instructions Make sure that you understand and can help the patient follow any healthcare provider instructions for all care.  Provide for the patient's basic needs You should help the patient with basic needs in the home and provide support for getting groceries, prescriptions, and other personal needs.  Monitor the patient's symptoms If they are getting sicker, call his or her medical provider and tell them that the patient has, or is being evaluated for, COVID-19 infection. This will help the healthcare provider's office take steps to keep other people from getting infected. Ask the healthcare provider to call the local or state health department.  Limit the number of people who have contact with the patient  If possible, have only one caregiver for the patient.  Other household members should stay in another home or place of residence. If this is not possible, they should stay  in another room, or be separated from the patient as much as possible. Use a separate bathroom, if available.  Restrict visitors who do not have an essential need to be in the home.  Keep older adults, very young children, and other sick people away from the patient Keep older adults, very young children, and those who have compromised immune systems or chronic health conditions  away from the patient. This includes people with chronic heart, lung, or kidney conditions, diabetes, and cancer.  Ensure good ventilation Make sure that shared spaces in the home have good air flow, such as from an air conditioner or an opened window, weather permitting.  Wash your hands often  Wash your hands often and thoroughly with soap and water for at least 20 seconds. You can use an alcohol based hand sanitizer if soap and water are not available and if your hands are not visibly dirty.  Avoid touching your eyes, nose, and mouth with unwashed hands.  Use disposable paper towels to dry your hands. If not available, use dedicated cloth towels and replace them when they become wet.  Wear a facemask and gloves  Wear a disposable facemask at all times in the room and gloves when you touch or have contact with the patient's blood, body fluids, and/or secretions or excretions, such as sweat, saliva, sputum, nasal mucus, vomit, urine, or feces.  Ensure the mask fits over your nose and mouth tightly, and do not touch it during use.  Throw out disposable facemasks and gloves after using them. Do not reuse.  Wash your hands immediately after removing your facemask and gloves.  If your personal clothing becomes contaminated, carefully remove  clothing and launder. Wash your hands after handling contaminated clothing.  Place all used disposable facemasks, gloves, and other waste in a lined container before disposing them with other household waste.  Remove gloves and wash your hands immediately after handling these items.  Do not share dishes, glasses, or other household items with the patient  Avoid sharing household items. You should not share dishes, drinking glasses, cups, eating utensils, towels, bedding, or other items with a patient who is confirmed to have, or being evaluated for, COVID-19 infection.  After the person uses these items, you should wash them thoroughly with soap and  water.  Wash laundry thoroughly  Immediately remove and wash clothes or bedding that have blood, body fluids, and/or secretions or excretions, such as sweat, saliva, sputum, nasal mucus, vomit, urine, or feces, on them.  Wear gloves when handling laundry from the patient.  Read and follow directions on labels of laundry or clothing items and detergent. In general, wash and dry with the warmest temperatures recommended on the label.  Clean all areas the individual has used often  Clean all touchable surfaces, such as counters, tabletops, doorknobs, bathroom fixtures, toilets, phones, keyboards, tablets, and bedside tables, every day. Also, clean any surfaces that may have blood, body fluids, and/or secretions or excretions on them.  Wear gloves when cleaning surfaces the patient has come in contact with.  Use a diluted bleach solution (e.g., dilute bleach with 1 part bleach and 10 parts water) or a household disinfectant with a label that says EPA-registered for coronaviruses. To make a bleach solution at home, add 1 tablespoon of bleach to 1 quart (4 cups) of water. For a larger supply, add  cup of bleach to 1 gallon (16 cups) of water.  Read labels of cleaning products and follow recommendations provided on product labels. Labels contain instructions for safe and effective use of the cleaning product including precautions you should take when applying the product, such as wearing gloves or eye protection and making sure you have good ventilation during use of the product.  Remove gloves and wash hands immediately after cleaning.  Monitor yourself for signs and symptoms of illness Caregivers and household members are considered close contacts, should monitor their health, and will be asked to limit movement outside of the home to the extent possible. Follow the monitoring steps for close contacts listed on the symptom monitoring form.   ? If you have additional questions, contact your  local health department or call the epidemiologist on call at [phone] (available 24/7). ? This guidance is subject to change. For the most up-to-date guidance from [facility], please refer to their website: [employer]Met[employer].hu   Information on my medicine - ELIQUIS (apixaban)  Why was Eliquis prescribed for you? Eliquis was prescribed for you to reduce the risk of blood clots forming  What do You need to know about Eliquis? Take your Eliquis TWICE DAILY - one tablet in the morning and one tablet in the evening with or without food.  It would be best to take the dose about the same time each day.  If you have difficulty swallowing the tablet whole please discuss with your pharmacist how to take the medication safely.  Take Eliquis exactly as prescribed by your doctor and DO NOT stop taking Eliquis without talking to the doctor who prescribed the medication.  Stopping without other medication to take the place of Eliquis may increase your risk of developing a clot.  After discharge, you should have regular check-up  appointments with your healthcare provider that is prescribing your Eliquis.  What do you do if you miss a dose? If a dose of ELIQUIS is not taken at the scheduled time, take it as soon as possible on the same day and twice-daily administration should be resumed.  The dose should not be doubled to make up for a missed dose.  Do not take more than one tablet of ELIQUIS at the same time.  Important Safety Information A possible side effect of Eliquis is bleeding. You should call your healthcare provider right away if you experience any of the following: ? Bleeding from an injury or your nose that does not stop. ? Unusual colored urine (red or dark brown) or unusual colored stools (red or black). ? Unusual bruising for unknown reasons. ? A serious fall or if you hit your head (even if there is no bleeding).  Some  medicines may interact with Eliquis and might increase your risk of bleeding or clotting while on Eliquis. To help avoid this, consult your healthcare provider or pharmacist prior to using any new prescription or non-prescription medications, including herbals, vitamins, non-steroidal anti-inflammatory drugs (NSAIDs) and supplements.  This website has more information on Eliquis (apixaban): http://www.eliquis.com/eliquis/home

## 2019-11-14 NOTE — Progress Notes (Signed)
Occupational Therapy Treatment Patient Details Name: Brandon Snow MRN: [id number] DOB: 1960/07/09 Today's Date: 11/14/2019    History of present illness Pt is a 59 y.o. male admitted 10/30/19 with SOB and cough; workup for acute hypoxic respiratory failure due to COVID-19 pneumonia and ARDS. Negative for PE. PMH includes HTN; pt unvaccinated.   OT comments  Pt progressing towards established OT goals and is very motivated to participate in therapy. Providing education and handout on energy conservation techniques for ADLs and IADLs. Pt verbalized understanding and ways he plans to implement into daily routine. Discussed pacing and resting throughout ADLs and remaining calm. Educating pt on use of 3N1 as a shower seat and safe tub transfer. Providing education and answering questions in preparation for dc later today. Continue to recommend dc to home with family support.    Follow Up Recommendations  No OT follow up;Supervision/Assistance - 24 hour    Equipment Recommendations  Tub/shower seat    Recommendations for Other Services      Precautions / Restrictions Precautions Precautions: Fall;Other (comment) Precaution Comments: watch sats and HR Restrictions Weight Bearing Restrictions: No       Mobility Bed Mobility               General bed mobility comments: up in recliner on entry  Transfers Overall transfer level: Independent Equipment used: None Transfers: Sit to/from Stand Sit to Stand: Independent              Balance Overall balance assessment: Needs assistance Sitting-balance support: Feet supported;No upper extremity supported Sitting balance-Leahy Scale: Good     Standing balance support: Bilateral upper extremity supported;No upper extremity supported;During functional activity Standing balance-Leahy Scale: Fair Standing balance comment: RW for hallway amb                           ADL either performed or assessed with clinical  judgement   ADL Overall ADL's : Needs assistance/impaired                       Lower Body Dressing Details (indicate cue type and reason): Use of figure four method to don LB Oncologist Details (indicate cue type and reason): Educating pt on use of 3N1 for shower seat. Pt verbalized understanding   General ADL Comments: Focused session on education for energy conservation in preparation for dc later today. Pt verbalized way to implement into daily routine     Vision   Vision Assessment?: No apparent visual deficits   Perception     Praxis      Cognition Arousal/Alertness: Awake/alert Behavior During Therapy: WFL for tasks assessed/performed Overall Cognitive Status: Within Functional Limits for tasks assessed                                 General Comments: very motivated        Exercises Exercises: Other exercises Other Exercises Other Exercises: Reviewed purse lip breathing.    Shoulder Instructions       General Comments SpO2 in 90s on 3L while resting in recliner.     Pertinent Vitals/ Pain       Pain Assessment: No/denies pain  Home Living  Prior Functioning/Environment              Frequency  Min 2X/week        Progress Toward Goals  OT Goals(current goals can now be found in the care plan section)  Progress towards OT goals: Progressing toward goals  Acute Rehab OT Goals Patient Stated Goal: home OT Goal Formulation: With patient Time For Goal Achievement: 11/17/19 Potential to Achieve Goals: Good ADL Goals Pt Will Perform Grooming: with supervision;standing Pt Will Perform Upper Body Bathing: with set-up;sitting Pt Will Perform Lower Body Bathing: with supervision;sit to/from stand Pt Will Perform Upper Body Dressing: with set-up;sitting Pt Will Perform Lower Body Dressing: with supervision;sit to/from stand Pt Will  Transfer to Toilet: with supervision;ambulating;regular height toilet;bedside commode;grab bars Pt Will Perform Toileting - Clothing Manipulation and hygiene: with supervision;sit to/from stand Pt Will Perform Tub/Shower Transfer: Tub transfer;with supervision;ambulating;shower seat;tub bench Pt/caregiver will Perform Home Exercise Program: Increased strength;Right Upper extremity;Left upper extremity;With theraband;Independently Additional ADL Goal #1: Pt will tolerate 25 mins therapeutic activity with no more than 4 rest breaks and DOE no >3/4 Additional ADL Goal #2: Pt will independently incorporate 3 energy conservation techniques into daily activities  Plan Discharge plan remains appropriate    Co-evaluation                 AM-PAC OT "6 Clicks" Daily Activity     Outcome Measure   Help from another person eating meals?: None Help from another person taking care of personal grooming?: A Little Help from another person toileting, which includes using toliet, bedpan, or urinal?: A Little Help from another person bathing (including washing, rinsing, drying)?: A Lot Help from another person to put on and taking off regular upper body clothing?: A Little Help from another person to put on and taking off regular lower body clothing?: A Little 6 Click Score: 18    End of Session Equipment Utilized During Treatment: Oxygen (3L)  OT Visit Diagnosis: Unsteadiness on feet (R26.81)   Activity Tolerance Patient tolerated treatment well   Patient Left in chair;with call bell/phone within reach   Nurse Communication Mobility status        Time: 4627-0350 OT Time Calculation (min): 35 min  Charges: OT General Charges $OT Visit: 1 Visit OT Treatments $Self Care/Home Management : 23-37 mins  Viktor Philipp MSOT, OTR/L Acute Rehab Pager: [phone] Office: [phone]   Theodoro Grist Viraj Liby 11/14/2019, 11:51 AM

## 2019-11-14 NOTE — Discharge Summary (Signed)
Brandon Snow ZOX:[id number] DOB: [birth year] DOA: 10/30/2019  PCP: Patient, No Pcp Per  Admit date: 10/30/2019  Discharge date: 11/14/2019  Admitted From: Home  Disposition:  Home   Recommendations for Outpatient Follow-up:   Follow up with PCP in 1-2 weeks  PCP Please obtain BMP/CBC, 2 view CXR in 1week,  (see Discharge instructions)   PCP Please follow up on the following pending results: Check CBC, CMP and a two-view chest x-ray in a week, arrange for outpatient cardiology follow-up for chronic diastolic CHF which is currently compensated.  Monitor secondary to factors closely.   Home Health: None Equipment/Devices: Foley to nasal cannula oxygen to be used as needed, 3 and 1 Consultations: None  Discharge Condition: Stable    CODE STATUS: Full    Diet Recommendation: Heart Healthy   Diet Order            Diet - low sodium heart healthy           Diet Heart Room service appropriate? Yes; Fluid consistency: Thin  Diet effective now                  Chief Complaint  Patient presents with  . Cough  . Shortness of Breath     Brief history of present illness from the day of admission and additional interim summary    Brandon Ashford Flemingis a 59 y.o.malewith medical history significant ofhypertension presented to the ED on 9/29 via EMS for evaluation of shortness of breath and cough. Oxygen saturation 77% on room air, there he was diagnosed with acute hypoxic respiratory failure due to COVID-19 pneumonia and ARDS.  He was admitted to the hospital and placed on heated high flow.                                                                 Hospital Course    Acute Hypoxic Resp. Failure due to Acute Covid 19 Viral Pneumonitis -  he is unfortunately unvaccinated and has incurred severe parenchymal  lung injury with ARDS due to COVID-19 pneumonia, was treated with combinations of IV steroids, remdesivir and Baricitinib, was requiring up to 20 L of nasal cannula oxygen but now much improved, down to 2 to 3 L nasal cannula oxygen and is symptom-free at rest, will get oral steroid taper, Eliquis 2.5 twice daily for 2 weeks as his risk for developing DVT due to inflammation is high, will follow with PCP in a week, encouraged to sit up in chair at home use I-S and flutter valve for pulmonary toiletry and prone in bed at night .   SpO2: 99 % O2 Flow Rate (L/min): 3 L/min FiO2 (%): 90 %  Recent Labs  Lab 11/08/19 0454 11/08/19 0454 11/09/19 0408 11/09/19 0408 11/10/19 0500 11/11/19 0358 11/12/19 0220  11/13/19 0247 11/14/19 0058  WBC 10.2   < > 8.7   < > 10.2 9.8 10.0 10.6* 8.7  CRP 18.8*  --  11.8*  --   --   --   --   --   --   DDIMER 4.20*   < > 2.70*   < > 2.10* 1.54* 1.22* 0.87* 0.81*  BNP 34.1  --   --   --   --   --   --  22.4  --   PROCALCITON 0.20  --   --   --   --   --   --   --   --   AST 32   < > 85*  --  42* 37 35 38  --   ALT 46*   < > 95*  --  91* 87* 90* 76*  --   ALKPHOS 109   < > 124  --  110 117 115 105  --   BILITOT 1.3*   < > 1.1  --  0.4 0.5 0.7 1.0  --   ALBUMIN 2.4*   < > 2.6*  --  2.5* 2.7* 2.9* 2.8*  --    < > = values in this interval not displayed.      Asymptomatic transaminitis due to viral infection.  Trending down, continue to monitor  Smoking.  Counseled to quit.  Hypertension.  Placed on beta-blocker.  High D-dimer due to inflammation.  Negative CT angiogram chest and leg ultrasound.    Trend is stable will get 2 more weeks of prophylactic Eliquis upon discharge.     Discharge diagnosis     Principal Problem:   Pneumonia due to COVID-19 virus Active Problems:   Sepsis (HCC)   Acute respiratory failure with hypoxemia (HCC)   Elevated transaminase level   Essential hypertension    Discharge instructions    Discharge  Instructions    Diet - low sodium heart healthy   Complete by: As directed    Discharge instructions   Complete by: As directed    Follow with Primary MD in 7 days   Get CBC, CMP, 2 view Chest X ray -  checked next visit within 1 week by Primary MD   Activity: As tolerated with Full fall precautions use walker/cane & assistance as needed  Disposition Home    Diet: Heart Healthy   Special Instructions: If you have smoked or chewed Tobacco  in the last 2 yrs please stop smoking, stop any regular Alcohol  and or any Recreational drug use.  On your next visit with your primary care physician please Get Medicines reviewed and adjusted.  Please request your Prim.MD to go over all Hospital Tests and Procedure/Radiological results at the follow up, please get all Hospital records sent to your Prim MD by signing hospital release before you go home.  If you experience worsening of your admission symptoms, develop shortness of breath, life threatening emergency, suicidal or homicidal thoughts you must seek medical attention immediately by calling 911 or calling your MD immediately  if symptoms less severe.  You Must read complete instructions/literature along with all the possible adverse reactions/side effects for all the Medicines you take and that have been prescribed to you. Take any new Medicines after you have completely understood and accpet all the possible adverse reactions/side effects.   Increase activity slowly   Complete by: As directed       Discharge Medications   Allergies as of 11/14/2019  No Known Allergies     Medication List    STOP taking these medications   Aleve 220 MG tablet Generic drug: naproxen sodium   azithromycin 250 MG tablet Commonly known as: Zithromax Z-Pak   ibuprofen 800 MG tablet Commonly known as: ADVIL   predniSONE 20 MG tablet Commonly known as: DELTASONE Replaced by: predniSONE 5 MG tablet     TAKE these medications   albuterol 108  (90 Base) MCG/ACT inhaler Commonly known as: VENTOLIN HFA Inhale 2 puffs into the lungs every 6 (six) hours as needed for wheezing or shortness of breath.   apixaban 2.5 MG Tabs tablet Commonly known as: Eliquis Take 1 tablet (2.5 mg total) by mouth 2 (two) times daily for 14 days.   benzonatate 100 MG capsule Commonly known as: TESSALON Take 2 capsules (200 mg total) by mouth 2 (two) times daily as needed for cough.   metoprolol tartrate 50 MG tablet Commonly known as: LOPRESSOR Take 1 tablet (50 mg total) by mouth 2 (two) times daily.   multivitamin tablet Take 1 tablet by mouth daily.   predniSONE 5 MG tablet Commonly known as: DELTASONE Label  & dispense according to the schedule below. take 6 Pills PO for 3 days, 4 Pills PO for 3 days, 2 Pills PO for 3 days, 1 Pills PO for 3 days, 1/2 Pill  PO for 3 days then STOP. Replaces: predniSONE 20 MG tablet   tetrahydrozoline-zinc 0.05-0.25 % ophthalmic solution Commonly known as: VISINE-AC Place 1 drop into both eyes 3 (three) times daily as needed (dry eyes).   vitamin C 1000 MG tablet Take 1,000 mg by mouth daily.            Durable Medical Equipment  (From admission, onward)         Start     Ordered   11/04/19 1003  For home use only DME 3 n 1  Once        11/04/19 1002   11/03/19 0720  For home use only DME oxygen  Once       Question Answer Comment  Length of Need 6 Months   Mode or (Route) Nasal cannula   Liters per Minute 4   Frequency Continuous (stationary and portable oxygen unit needed)   Oxygen conserving device Yes   Oxygen delivery system Gas      11/03/19 0719           Follow-up Information    [facility] Follow up.   Contact information: [address] [place] [address] [phone]       [facility] [place]. Go on 11/18/2019.   Specialty: Family Medicine Why: Appointment made for COVID clinic on 11/18/19 at 1100 am.  YOU MUST  ATTEND THIS APPOINTMENT. Contact information: [address] [place] [address] [phone]       [facility] Follow up.   Why: [employer] to provide equipment to room 3in1 commode and rolling walker [employer] to provide home oxygen Contact information: [address] [place] [address] [phone]        [facility]. Schedule an appointment as soon as possible for a visit in 1 week(s).   Contact information: [address] [place] [address] [phone]              Major procedures and Radiology Reports - PLEASE review detailed and final reports thoroughly  -  CT Angio Chest PE W and/or Wo Contrast  Result Date: 10/30/2019 CLINICAL DATA:  COVID-19 positive, worsening shortness of breath at home, O2 sat 77% on room air EXAM: CT ANGIOGRAPHY CHEST WITH CONTRAST TECHNIQUE: Multidetector CT imaging of the chest was performed using the standard protocol during bolus administration of intravenous contrast. Multiplanar CT image reconstructions and MIPs were obtained to evaluate the vascular anatomy. CONTRAST:  100mL OMNIPAQUE IOHEXOL 350 MG/ML SOLN COMPARISON:  Radiograph 10/30/2019 FINDINGS: Cardiovascular: Satisfactory opacification the pulmonary arteries to the segmental level. No pulmonary artery filling defects are identified. Central pulmonary arteries are normal caliber. The aortic root is suboptimally assessed given cardiac pulsation artifact. The aorta is normal caliber. No acute luminal abnormality of the imaged aorta. No periaortic stranding or hemorrhage. Normal 3 vessel branching of the aortic arch. Proximal great vessels are unremarkable. Normal heart size. No pericardial effusion. No major venous abnormalities. Mediastinum/Nodes: No mediastinal fluid or gas. Normal thyroid gland and thoracic inlet. No acute abnormality of the trachea or esophagus. Scattered borderline enlarged  nodes with preserved nodal architecture within the mediastinum and hila for instance a 12 mm left paratracheal node (4/37), 10 mm right paratracheal node (4/37), and an 11 mm right hilar node (4/51) are favored to be reactive. No worrisome enlarged mediastinal, hilar or axillary adenopathy. Lungs/Pleura: Multifocal areas of mixed consolidative and ground-glass opacity with interstitial/septal thickening in a crazy paving pattern which is an often reported imaging feature COVID 19 among other infectious and inflammatory etiologies. No pneumothorax or effusion. Mild diffuse airways thickening. Upper Abdomen: No acute abnormalities present in the visualized portions of the upper abdomen. Musculoskeletal: No acute osseous abnormality or suspicious osseous lesion. Multilevel degenerative changes are present in the imaged portions of the spine. No worrisome chest wall lesions. Review of the MIP images confirms the above findings. IMPRESSION: 1. No evidence of pulmonary embolism. 2. Multifocal areas of mixed consolidative and ground-glass opacity with interstitial/septal thickening compatible with reported imaging features of COVID 19. 3. Scattered borderline enlarged nodes within the mediastinum and hila, favored to be reactive. Electronically Signed   By: Kreg ShropshirePrice  DeHay M.D.   On: 10/30/2019 20:59   DG Chest Port 1 View  Result Date: 11/05/2019 CLINICAL DATA:  Shortness of breath, COVID positive EXAM: PORTABLE CHEST 1 VIEW COMPARISON:  Chest radiograph dated 11/01/2019. FINDINGS: The cardiac silhouette is obscured, but appears unchanged. Moderate bilateral airspace opacities appear decreased on the right and unchanged on the left. No large pleural effusion is identified. There is no pneumothorax. The osseous structures are unremarkable. IMPRESSION: Moderate bilateral airspace opacities, decreased on the right and unchanged on the left. Electronically Signed   By: Romona Curlsyler  Litton M.D.   On: 11/05/2019 08:45   DG Chest  Port 1 View  Result Date: 11/01/2019 CLINICAL DATA:  COVID-19 positive with airspace opacity EXAM: PORTABLE CHEST 1 VIEW COMPARISON:  Chest radiograph and chest CT October 30, 2019 FINDINGS: Airspace opacity is again noted throughout much of the right lung with greatest concentration of opacity in the right mid and lower lung regions. Subtle opacity noted in the left mid and lower lung regions. Stable cardiac enlargement. Pulmonary vascularity normal. No adenopathy. No bone lesions. IMPRESSION: Stable multifocal airspace opacity, consistent with known atypical organism pneumonia. No new areas of opacity evident. Stable cardiac prominence. No adenopathy appreciable. Electronically Signed   By: Bretta BangWilliam  Woodruff III M.D.   On: 11/01/2019 08:26   DG Chest Port 1 View  Result Date: 10/30/2019 CLINICAL DATA:  Shortness of  breath.  COVID. EXAM: PORTABLE CHEST 1 VIEW COMPARISON:  10/15/2016 FINDINGS: Multifocal airspace opacities throughout the right greater than left lungs. No visible pleural effusions. No pneumothorax. Cardiac silhouette is accentuated by low lung volumes and portable technique. No acute osseous abnormality. IMPRESSION: Multifocal right greater than left airspace opacities, compatible with multifocal pneumonia and reported history of COVID. Electronically Signed   By: Feliberto Harts MD   On: 10/30/2019 19:36   VAS [place] LOWER EXTREMITY VENOUS (DVT)  Result Date: 11/03/2019  Lower Venous DVTStudy Indications: D-dimer.  Anticoagulation: Lovenox. Comparison Study: No prior studies. Performing Technologist: Jean Rosenthal  Examination Guidelines: A complete evaluation includes B-mode imaging, spectral Doppler, color Doppler, and power Doppler as needed of all accessible portions of each vessel. Bilateral testing is considered an integral part of a complete examination. Limited examinations for reoccurring indications may be performed as noted. The reflux portion of the exam is performed with the  patient in reverse Trendelenburg.  +---------+---------------+---------+-----------+----------+--------------+ RIGHT    CompressibilityPhasicitySpontaneityPropertiesThrombus Aging +---------+---------------+---------+-----------+----------+--------------+ CFV      Full           Yes      Yes                                 +---------+---------------+---------+-----------+----------+--------------+ SFJ      Full                                                        +---------+---------------+---------+-----------+----------+--------------+ FV Prox  Full                                                        +---------+---------------+---------+-----------+----------+--------------+ FV Mid   Full                                                        +---------+---------------+---------+-----------+----------+--------------+ FV DistalFull                                                        +---------+---------------+---------+-----------+----------+--------------+ PFV      Full                                                        +---------+---------------+---------+-----------+----------+--------------+ POP      Full           Yes      Yes                                 +---------+---------------+---------+-----------+----------+--------------+ PTV      Full                                                        +---------+---------------+---------+-----------+----------+--------------+  PERO     Full                                                        +---------+---------------+---------+-----------+----------+--------------+   +---------+---------------+---------+-----------+----------+--------------+ LEFT     CompressibilityPhasicitySpontaneityPropertiesThrombus Aging +---------+---------------+---------+-----------+----------+--------------+ CFV      Full           Yes      Yes                                  +---------+---------------+---------+-----------+----------+--------------+ SFJ      Full                                                        +---------+---------------+---------+-----------+----------+--------------+ FV Prox  Full                                                        +---------+---------------+---------+-----------+----------+--------------+ FV Mid   Full                                                        +---------+---------------+---------+-----------+----------+--------------+ FV DistalFull                                                        +---------+---------------+---------+-----------+----------+--------------+ PFV      Full                                                        +---------+---------------+---------+-----------+----------+--------------+ POP      Full           Yes      Yes                                 +---------+---------------+---------+-----------+----------+--------------+ PTV      Full                                                        +---------+---------------+---------+-----------+----------+--------------+ PERO     Full                                                        +---------+---------------+---------+-----------+----------+--------------+  Summary: RIGHT: - There is no evidence of deep vein thrombosis in the lower extremity.  - No cystic structure found in the popliteal fossa.  LEFT: - There is no evidence of deep vein thrombosis in the lower extremity.  - No cystic structure found in the popliteal fossa.  *See table(s) above for measurements and observations. Electronically signed by Coral Else MD on 11/03/2019 at 7:41:49 PM.    Final    ECHOCARDIOGRAM LIMITED  Result Date: 11/13/2019    ECHOCARDIOGRAM LIMITED REPORT   Patient Name:   Brandon Snow Date of Exam: 11/13/2019 Medical Rec #:  [id number]        Height:       69.0 in Accession #:    [id number]       Weight:        287.8 lb Date of Birth:  [birth year]       BSA:          2.412 m Patient Age:    58 years         BP:           107/75 mmHg Patient Gender: M                HR:           89 bpm. Exam Location:  Inpatient Procedure: Limited Echo, Limited Color Doppler and Cardiac Doppler Indications:    acute diastolic chf 428.31  History:        Patient has no prior history of Echocardiogram examinations.                 Covid; Risk Factors:Hypertension.  Sonographer:    Delcie Roch Referring Phys: Effie Shy Joice Nazario K [facility] IMPRESSIONS  1. Left ventricular ejection fraction, by estimation, is 60 to 65%. The left ventricle has normal function. The left ventricle has no regional wall motion abnormalities. There is mild concentric left ventricular hypertrophy. Left ventricular diastolic parameters are consistent with Grade I diastolic dysfunction (impaired relaxation).  2. Right ventricular systolic function is normal. The right ventricular size is normal.  3. The mitral valve is normal in structure. No evidence of mitral valve regurgitation.  4. The aortic valve is tricuspid. There is mild thickening of the aortic valve. Aortic valve regurgitation is not visualized.  5. The inferior vena cava is normal in size with greater than 50% respiratory variability, suggesting right atrial pressure of 3 mmHg. Comparison(s): No prior Echocardiogram. FINDINGS  Left Ventricle: Left ventricular ejection fraction, by estimation, is 60 to 65%. The left ventricle has normal function. The left ventricle has no regional wall motion abnormalities. There is mild concentric left ventricular hypertrophy. Left ventricular diastolic parameters are consistent with Grade I diastolic dysfunction (impaired relaxation). Right Ventricle: The right ventricular size is normal. Right ventricular systolic function is normal. Left Atrium: Left atrial size was normal in size. Right Atrium: Right atrial size was normal in size. Pericardium: There is no evidence of  pericardial effusion. Mitral Valve: The mitral valve is normal in structure. There is mild thickening of the mitral valve leaflet(s). Mild mitral annular calcification. Tricuspid Valve: The tricuspid valve is normal in structure. Tricuspid valve regurgitation is trivial. Aortic Valve: The aortic valve is tricuspid. There is mild thickening of the aortic valve. Aortic valve regurgitation is not visualized. Pulmonic Valve: The pulmonic valve was normal in structure. Pulmonic valve regurgitation is trivial. Aorta: The aortic root is normal in size and structure. Venous: The inferior vena cava is normal in  size with greater than 50% respiratory variability, suggesting right atrial pressure of 3 mmHg. LEFT VENTRICLE PLAX 2D LVIDd:         4.00 cm  Diastology LVIDs:         2.60 cm  LV e' medial:    7.29 cm/s LV PW:         1.10 cm  LV E/e' medial:  9.3 LV IVS:        0.90 cm  LV e' lateral:   6.96 cm/s LVOT diam:     2.40 cm  LV E/e' lateral: 9.7 LV SV:         105 LV SV Index:   44 LVOT Area:     4.52 cm  IVC IVC diam: 1.60 cm LEFT ATRIUM         Index LA diam:    4.20 cm 1.74 cm/m  AORTIC VALVE LVOT Vmax:   129.00 cm/s LVOT Vmean:  89.100 cm/s LVOT VTI:    0.232 m  AORTA Ao Root diam: 3.60 cm MITRAL VALVE MV Area (PHT): 3.53 cm    SHUNTS MV Decel Time: 215 msec    Systemic VTI:  0.23 m MV E velocity: 67.70 cm/s  Systemic Diam: 2.40 cm MV A velocity: 72.80 cm/s MV E/A ratio:  0.93 Laurance Flatten MD Electronically signed by Laurance Flatten MD Signature Date/Time: 11/13/2019/5:23:52 PM    Final     Micro Results     No results found for this or any previous visit (from the past 240 hour(s)).  Today   Subjective    Brandon Snow today has no headache,no chest abdominal pain,no new weakness tingling or numbness, feels much better wants to go home today.     Objective   Blood pressure 131/69, pulse 98, temperature 98 F (36.7 C), temperature source Oral, resp. rate 20, height  (1.753 m),  weight 130.5 kg, SpO2 99 %.   Intake/Output Summary (Last 24 hours) at 11/14/2019 1009 Last data filed at 11/14/2019 0900 Gross per 24 hour  Intake 1300.61 ml  Output 1150 ml  Net 150.61 ml    Exam  Awake Alert, No new F.N deficits, Normal affect [place].AT,PERRAL Supple Neck,No JVD, No cervical lymphadenopathy appriciated.  Symmetrical Chest wall movement, Good air movement bilaterally, CTAB RRR,No Gallops,Rubs or new Murmurs, No Parasternal Heave +ve B.Sounds, Abd Soft, Non tender, No organomegaly appriciated, No rebound -guarding or rigidity. No Cyanosis, Clubbing or edema, No new Rash or bruise   Data Review   CBC w Diff:  Lab Results  Component Value Date   WBC 8.7 11/14/2019   HGB 14.1 11/14/2019   HCT 42.3 11/14/2019   PLT 258 11/14/2019   LYMPHOPCT 11 11/09/2019   MONOPCT 9 11/09/2019   EOSPCT 0 11/09/2019   BASOPCT 0 11/09/2019    CMP:  Lab Results  Component Value Date   NA 133 (L) 11/13/2019   K 4.6 11/13/2019   CL 90 (L) 11/13/2019   CO2 33 (H) 11/13/2019   BUN 19 11/13/2019   CREATININE 1.04 11/13/2019   PROT 7.3 11/13/2019   ALBUMIN 2.8 (L) 11/13/2019   BILITOT 1.0 11/13/2019   ALKPHOS 105 11/13/2019   AST 38 11/13/2019   ALT 76 (H) 11/13/2019  .   Total Time in preparing paper work, data evaluation and todays exam - 35 minutes  Susa Raring M.D on 11/14/2019 at 10:09 AM  [facility]   Office  [phone]

## 2019-11-14 NOTE — TOC Transition Note (Signed)
Transition of Care [facility]) - CM/SW Discharge Note   Patient Details  Name: Brandon Snow MRN: [id number] Date of Birth: 17-Mar-1960  [facility] CM/SW Contact:  Lockie Pares, RN Phone Number: 11/14/2019, 1:27 PM   Clinical Narrative:    [place] and oxygen to be delivered to room, 3:1 and oxygen for home delivered to apartment already. Does not qualify for Home Health. Patient states he doesn't think he would probably need Home health . Referral made by MD to [address] outpatient PT. They will call and schedule.    Final next level of care: Home/Self Care Barriers to Discharge: No Barriers Identified   Patient Goals and CMS Choice Patient states their goals for this hospitalization and ongoing recovery are:: to go home      Discharge Placement                       Discharge Plan and Services   Discharge Planning Services: CM Consult            DME Arranged: Oxygen, 3-N-1, Walker rolling DME Agency: [employer] Date DME Agency Contacted: 11/13/19 Time DME Agency Contacted: 1400 Representative spoke with at DME Agency: Velna Hatchet for DME,  Zach for oxygen, charity case            Social Determinants of Health (SDOH) Interventions     Readmission Risk Interventions No flowsheet data found.

## 2019-11-14 NOTE — Progress Notes (Signed)
Pt given discharge instructions, prescriptions, and care notes. Pt verbalized understanding AEB no further questions or concerns at this time. IV was discontinued, no redness, pain, or swelling noted at this time. Telemetry discontinued and Centralized Telemetry was notified. Pt left the floor via wheelchair with staff in stable condition. 

## 2019-11-18 ENCOUNTER — Ambulatory Visit (INDEPENDENT_AMBULATORY_CARE_PROVIDER_SITE_OTHER): Payer: HRSA Program | Admitting: Nurse Practitioner

## 2019-11-18 VITALS — BP 141/92 | HR 94 | Temp 97.8°F

## 2019-11-18 DIAGNOSIS — F419 Anxiety disorder, unspecified: Secondary | ICD-10-CM | POA: Diagnosis not present

## 2019-11-18 DIAGNOSIS — R5381 Other malaise: Secondary | ICD-10-CM

## 2019-11-18 DIAGNOSIS — J9601 Acute respiratory failure with hypoxia: Secondary | ICD-10-CM

## 2019-11-18 DIAGNOSIS — J1282 Pneumonia due to coronavirus disease 2019: Secondary | ICD-10-CM

## 2019-11-18 DIAGNOSIS — U071 COVID-19: Secondary | ICD-10-CM

## 2019-11-18 MED ORDER — HYDROXYZINE HCL 10 MG PO TABS: 10.0000 mg | ORAL_TABLET | Freq: Three times a day (TID) | ORAL | 0 refills | Status: DC | PRN

## 2019-11-18 NOTE — Assessment & Plan Note (Signed)
Acute hypoxic respiratory failure Deconditioning  Elevated D-dimer Elevated liver function:   Stay well hydrated  Stay active  Deep breathing exercises  May take tylenol or fever or pain  May take mucinex twice daily  Will order chest x ray  Continue O2 at 4 L [place]  Will order home health nursing and PT  Continue Eliquis as directed at ED D/C   Follow up:  Follow up in 1-2 weeks or sooner if needed

## 2019-11-18 NOTE — Progress Notes (Addendum)
  ID: Brandon Snow, male    DOB: [birth year], 59 y.o.   MRN: [id number]  Chief Complaint  Patient presents with  . Hospitalization Follow-up    Hosp: 9/29-10/14 on 4L of O2.     Referring provider: No ref. provider found   59 year old male with history of hypertension.  HPI  Patient presents today for post COVID care clinic visit/hospital follow-up.  Patient was admitted to the hospital on 10/30/2019 through 11/14/2019.  Patient was admitted to acute hypoxic respiratory failure due to Covid pneumonia.  Patient was treated with IV steroids, remdesivir, baricitinib.  Patient initially required 20 L of O2 via nasal cannula but was discharged home on 3 to 4 L of O2 continuous.  Patient was also discharged home on Eliquis due to elevated D-dimer.  Patient states since hospital discharge she has been doing well.  Patient is very deconditioned.  He comes into the office today in a wheelchair.  He is still on 4 L of O2 per nasal cannula.  He states that at times when he has at home resting in the chair he does go without his oxygen.  It was noted that patient had elevated liver function in the hospital.  Will need repeat labs and repeat chest x-ray today.  Patient has been compliant with deep breathing exercises. Patient also complains today of anxiety. Denies f/c/s, n/v/d, hemoptysis, PND, leg. chest pain or edema.      No Known Allergies  There is no immunization history for the selected administration types on file for this patient.  Past Medical History:  Diagnosis Date  . Hypertension     Tobacco History: Social History   Tobacco Use  Smoking Status Current Some Day Smoker  . Packs/day: 0.50  . Types: Cigarettes  Smokeless Tobacco Never Used   Ready to quit: Not Answered Counseling given: Not Answered   Outpatient Encounter Medications as of 11/18/2019  Medication Sig  . albuterol (VENTOLIN HFA) 108 (90 Base) MCG/ACT inhaler Inhale 2 puffs into the lungs every 6  (six) hours as needed for wheezing or shortness of breath.  Marland Kitchen apixaban (ELIQUIS) 2.5 MG TABS tablet Take 1 tablet (2.5 mg total) by mouth 2 (two) times daily for 14 days.  . Ascorbic Acid (VITAMIN C) 1000 MG tablet Take 1,000 mg by mouth daily.  . benzonatate (TESSALON) 100 MG capsule Take 2 capsules (200 mg total) by mouth 2 (two) times daily as needed for cough.  . metoprolol tartrate (LOPRESSOR) 50 MG tablet Take 1 tablet (50 mg total) by mouth 2 (two) times daily.  . Multiple Vitamin (MULTIVITAMIN) tablet Take 1 tablet by mouth daily.  . predniSONE (DELTASONE) 5 MG tablet Label  & dispense according to the schedule below. take 6 Pills PO for 3 days, 4 Pills PO for 3 days, 2 Pills PO for 3 days, 1 Pills PO for 3 days, 1/2 Pill  PO for 3 days then STOP.  Marland Kitchen tetrahydrozoline-zinc (VISINE-AC) 0.05-0.25 % ophthalmic solution Place 1 drop into both eyes 3 (three) times daily as needed (dry eyes).   No facility-administered encounter medications on file as of 11/18/2019.     Review of Systems  Review of Systems  Constitutional: Positive for fatigue.  HENT: Negative.   Respiratory: Positive for cough and shortness of breath.   Cardiovascular: Negative.  Negative for chest pain, palpitations and leg swelling.  Gastrointestinal: Negative.   Allergic/Immunologic: Negative.   Neurological: Positive for weakness.  Psychiatric/Behavioral: Negative.  Physical Exam  BP (!) 141/92 (BP Location: Right Arm)   Pulse 94   Temp 97.8 F (36.6 C)   SpO2 91%   Wt Readings from Last 5 Encounters:  10/31/19 287 lb 12.8 oz (130.5 kg)  12/22/18 274 lb 14.6 oz (124.7 kg)  10/15/16 275 lb (124.7 kg)  07/08/15 285 lb (129.3 kg)  04/06/14 294 lb 1 oz (133.4 kg)     Physical Exam Vitals and nursing note reviewed.  Constitutional:      General: He is not in acute distress.    Appearance: He is well-developed.  Cardiovascular:     Rate and Rhythm: Normal rate and regular rhythm.  Pulmonary:      Effort: Pulmonary effort is normal.     Breath sounds: Normal breath sounds.  Musculoskeletal:     Right lower leg: No edema.     Left lower leg: No edema.  Skin:    General: Skin is warm and dry.  Neurological:     Mental Status: He is alert and oriented to person, place, and time.  Psychiatric:        Mood and Affect: Mood normal.        Behavior: Behavior normal.      Imaging: CT Angio Chest PE W and/or Wo Contrast  Result Date: 10/30/2019 CLINICAL DATA:  COVID-19 positive, worsening shortness of breath at home, O2 sat 77% on room air EXAM: CT ANGIOGRAPHY CHEST WITH CONTRAST TECHNIQUE: Multidetector CT imaging of the chest was performed using the standard protocol during bolus administration of intravenous contrast. Multiplanar CT image reconstructions and MIPs were obtained to evaluate the vascular anatomy. CONTRAST:  OMNIPAQUE IOHEXOL 350 MG/ML SOLN COMPARISON:  Radiograph 10/30/2019 FINDINGS: Cardiovascular: Satisfactory opacification the pulmonary arteries to the segmental level. No pulmonary artery filling defects are identified. Central pulmonary arteries are normal caliber. The aortic root is suboptimally assessed given cardiac pulsation artifact. The aorta is normal caliber. No acute luminal abnormality of the imaged aorta. No periaortic stranding or hemorrhage. Normal 3 vessel branching of the aortic arch. Proximal great vessels are unremarkable. Normal heart size. No pericardial effusion. No major venous abnormalities. Mediastinum/Nodes: No mediastinal fluid or gas. Normal thyroid gland and thoracic inlet. No acute abnormality of the trachea or esophagus. Scattered borderline enlarged nodes with preserved nodal architecture within the mediastinum and hila for instance a 12 mm left paratracheal node (4/37), 10 mm right paratracheal node (4/37), and an 11 mm right hilar node (4/51) are favored to be reactive. No worrisome enlarged mediastinal, hilar or axillary adenopathy.  Lungs/Pleura: Multifocal areas of mixed consolidative and ground-glass opacity with interstitial/septal thickening in a crazy paving pattern which is an often reported imaging feature COVID 19 among other infectious and inflammatory etiologies. No pneumothorax or effusion. Mild diffuse airways thickening. Upper Abdomen: No acute abnormalities present in the visualized portions of the upper abdomen. Musculoskeletal: No acute osseous abnormality or suspicious osseous lesion. Multilevel degenerative changes are present in the imaged portions of the spine. No worrisome chest wall lesions. Review of the MIP images confirms the above findings. IMPRESSION: 1. No evidence of pulmonary embolism. 2. Multifocal areas of mixed consolidative and ground-glass opacity with interstitial/septal thickening compatible with reported imaging features of COVID 19. 3. Scattered borderline enlarged nodes within the mediastinum and hila, favored to be reactive. Electronically Signed   By: Kreg Shropshire M.D.   On: 10/30/2019 20:59   DG Chest Port 1 View  Result Date: 11/05/2019 CLINICAL DATA:  Shortness of breath,  COVID positive EXAM: PORTABLE CHEST 1 VIEW COMPARISON:  Chest radiograph dated 11/01/2019. FINDINGS: The cardiac silhouette is obscured, but appears unchanged. Moderate bilateral airspace opacities appear decreased on the right and unchanged on the left. No large pleural effusion is identified. There is no pneumothorax. The osseous structures are unremarkable. IMPRESSION: Moderate bilateral airspace opacities, decreased on the right and unchanged on the left. Electronically Signed   By: Romona Curlsyler  Litton M.D.   On: 11/05/2019 08:45   DG Chest Port 1 View  Result Date: 11/01/2019 CLINICAL DATA:  COVID-19 positive with airspace opacity EXAM: PORTABLE CHEST 1 VIEW COMPARISON:  Chest radiograph and chest CT October 30, 2019 FINDINGS: Airspace opacity is again noted throughout much of the right lung with greatest concentration of  opacity in the right mid and lower lung regions. Subtle opacity noted in the left mid and lower lung regions. Stable cardiac enlargement. Pulmonary vascularity normal. No adenopathy. No bone lesions. IMPRESSION: Stable multifocal airspace opacity, consistent with known atypical organism pneumonia. No new areas of opacity evident. Stable cardiac prominence. No adenopathy appreciable. Electronically Signed   By: Bretta BangWilliam  Woodruff III M.D.   On: 11/01/2019 08:26   DG Chest Port 1 View  Result Date: 10/30/2019 CLINICAL DATA:  Shortness of breath.  COVID. EXAM: PORTABLE CHEST 1 VIEW COMPARISON:  10/15/2016 FINDINGS: Multifocal airspace opacities throughout the right greater than left lungs. No visible pleural effusions. No pneumothorax. Cardiac silhouette is accentuated by low lung volumes and portable technique. No acute osseous abnormality. IMPRESSION: Multifocal right greater than left airspace opacities, compatible with multifocal pneumonia and reported history of COVID. Electronically Signed   By: Feliberto HartsFrederick S Jones MD   On: 10/30/2019 19:36   VAS US LOWER EXTREMITY VENOUS (DVT)  Result Date: 11/03/2019  Lower Venous DVTStudy Indications: D-dimer.  Anticoagulation: Lovenox. Comparison Study: No prior studies. Performing Technologist: Jean Rosenthalachel Hodge  Examination Guidelines: A complete evaluation includes B-mode imaging, spectral Doppler, color Doppler, and power Doppler as needed of all accessible portions of each vessel. Bilateral testing is considered an integral part of a complete examination. Limited examinations for reoccurring indications may be performed as noted. The reflux portion of the exam is performed with the patient in reverse Trendelenburg.  +---------+---------------+---------+-----------+----------+--------------+ RIGHT    CompressibilityPhasicitySpontaneityPropertiesThrombus Aging +---------+---------------+---------+-----------+----------+--------------+ CFV      Full           Yes       Yes                                 +---------+---------------+---------+-----------+----------+--------------+ SFJ      Full                                                        +---------+---------------+---------+-----------+----------+--------------+ FV Prox  Full                                                        +---------+---------------+---------+-----------+----------+--------------+ FV Mid   Full                                                        +---------+---------------+---------+-----------+----------+--------------+  FV DistalFull                                                        +---------+---------------+---------+-----------+----------+--------------+ PFV      Full                                                        +---------+---------------+---------+-----------+----------+--------------+ POP      Full           Yes      Yes                                 +---------+---------------+---------+-----------+----------+--------------+ PTV      Full                                                        +---------+---------------+---------+-----------+----------+--------------+ PERO     Full                                                        +---------+---------------+---------+-----------+----------+--------------+   +---------+---------------+---------+-----------+----------+--------------+ LEFT     CompressibilityPhasicitySpontaneityPropertiesThrombus Aging +---------+---------------+---------+-----------+----------+--------------+ CFV      Full           Yes      Yes                                 +---------+---------------+---------+-----------+----------+--------------+ SFJ      Full                                                        +---------+---------------+---------+-----------+----------+--------------+ FV Prox  Full                                                         +---------+---------------+---------+-----------+----------+--------------+ FV Mid   Full                                                        +---------+---------------+---------+-----------+----------+--------------+ FV DistalFull                                                        +---------+---------------+---------+-----------+----------+--------------+   PFV      Full                                                        +---------+---------------+---------+-----------+----------+--------------+ POP      Full           Yes      Yes                                 +---------+---------------+---------+-----------+----------+--------------+ PTV      Full                                                        +---------+---------------+---------+-----------+----------+--------------+ PERO     Full                                                        +---------+---------------+---------+-----------+----------+--------------+     Summary: RIGHT: - There is no evidence of deep vein thrombosis in the lower extremity.  - No cystic structure found in the popliteal fossa.  LEFT: - There is no evidence of deep vein thrombosis in the lower extremity.  - No cystic structure found in the popliteal fossa.  *See table(s) above for measurements and observations. Electronically signed by Coral Else MD on 11/03/2019 at 7:41:49 PM.    Final    ECHOCARDIOGRAM LIMITED  Result Date: 11/13/2019    ECHOCARDIOGRAM LIMITED REPORT   Patient Name:   Brandon Snow Date of Exam: 11/13/2019 Medical Rec #:  [id number]        Height:       69.0 in Accession #:    [id number]       Weight:       287.8 lb Date of Birth:  04/08/1960       BSA:          2.412 m Patient Age:    58 years         BP:           107/75 mmHg Patient Gender: M                HR:           89 bpm. Exam Location:  Inpatient Procedure: Limited Echo, Limited Color Doppler and Cardiac Doppler Indications:    acute  diastolic chf 428.31  History:        Patient has no prior history of Echocardiogram examinations.                 Covid; Risk Factors:Hypertension.  Sonographer:    Delcie Roch Referring Phys: Effie Shy PRASHANT K [facility] IMPRESSIONS  1. Left ventricular ejection fraction, by estimation, is 60 to 65%. The left ventricle has normal function. The left ventricle has no regional wall motion abnormalities. There is mild concentric left ventricular hypertrophy. Left ventricular diastolic parameters are consistent with Grade I diastolic dysfunction (impaired relaxation).  2. Right ventricular systolic function is  normal. The right ventricular size is normal.  3. The mitral valve is normal in structure. No evidence of mitral valve regurgitation.  4. The aortic valve is tricuspid. There is mild thickening of the aortic valve. Aortic valve regurgitation is not visualized.  5. The inferior vena cava is normal in size with greater than 50% respiratory variability, suggesting right atrial pressure of 3 mmHg. Comparison(s): No prior Echocardiogram. FINDINGS  Left Ventricle: Left ventricular ejection fraction, by estimation, is 60 to 65%. The left ventricle has normal function. The left ventricle has no regional wall motion abnormalities. There is mild concentric left ventricular hypertrophy. Left ventricular diastolic parameters are consistent with Grade I diastolic dysfunction (impaired relaxation). Right Ventricle: The right ventricular size is normal. Right ventricular systolic function is normal. Left Atrium: Left atrial size was normal in size. Right Atrium: Right atrial size was normal in size. Pericardium: There is no evidence of pericardial effusion. Mitral Valve: The mitral valve is normal in structure. There is mild thickening of the mitral valve leaflet(s). Mild mitral annular calcification. Tricuspid Valve: The tricuspid valve is normal in structure. Tricuspid valve regurgitation is trivial. Aortic Valve: The aortic  valve is tricuspid. There is mild thickening of the aortic valve. Aortic valve regurgitation is not visualized. Pulmonic Valve: The pulmonic valve was normal in structure. Pulmonic valve regurgitation is trivial. Aorta: The aortic root is normal in size and structure. Venous: The inferior vena cava is normal in size with greater than 50% respiratory variability, suggesting right atrial pressure of 3 mmHg. LEFT VENTRICLE PLAX 2D LVIDd:         4.00 cm  Diastology LVIDs:         2.60 cm  LV e' medial:    7.29 cm/s LV PW:         1.10 cm  LV E/e' medial:  9.3 LV IVS:        0.90 cm  LV e' lateral:   6.96 cm/s LVOT diam:     2.40 cm  LV E/e' lateral: 9.7 LV SV:         105 LV SV Index:   44 LVOT Area:     4.52 cm  IVC IVC diam: 1.60 cm LEFT ATRIUM         Index LA diam:    4.20 cm 1.74 cm/m  AORTIC VALVE LVOT Vmax:   129.00 cm/s LVOT Vmean:  89.100 cm/s LVOT VTI:    0.232 m  AORTA Ao Root diam: 3.60 cm MITRAL VALVE MV Area (PHT): 3.53 cm    SHUNTS MV Decel Time: 215 msec    Systemic VTI:  0.23 m MV E velocity: 67.70 cm/s  Systemic Diam: 2.40 cm MV A velocity: 72.80 cm/s MV E/A ratio:  0.93 Laurance Flatten MD Electronically signed by Laurance Flatten MD Signature Date/Time: 11/13/2019/5:23:52 PM    Final      Assessment & Plan:   Pneumonia due to COVID-19 virus Acute hypoxic respiratory failure Deconditioning  Elevated D-dimer Elevated liver function:   Stay well hydrated  Stay active  Deep breathing exercises  May take tylenol or fever or pain  May take mucinex twice daily  Will order chest x ray  Continue O2 at 4 L [place]  Will order home health nursing and PT  Continue Eliquis as directed at ED D/C   Follow up:  Follow up in 1-2 weeks or sooner if needed      Ivonne Andrew, NP 11/18/2019

## 2019-11-18 NOTE — Patient Instructions (Addendum)
Covid 19 Acute hypoxic respiratory failure Deconditioning  Elevated D-dimer Elevated liver function:   Stay well hydrated  Stay active  Deep breathing exercises  May take tylenol or fever or pain  May take mucinex twice daily  Will order chest x ray  Continue O2 at 4 L [place]  Will order home health nursing and PT  Continue Eliquis as directed at ED D/C  Anxiety:  Will order vistaril to take sparingly as needed   Follow up:  Follow up in 1-2 weeks or sooner if needed

## 2019-11-18 NOTE — Addendum Note (Signed)
Addended by: Ivonne Andrew on: 11/18/2019 04:11 PM   Modules accepted: Orders

## 2019-11-19 LAB — BASIC METABOLIC PANEL
BUN/Creatinine Ratio: 13 (ref 9–20)
BUN: 10 mg/dL (ref 6–24)
CO2: 23 mmol/L (ref 20–29)
Calcium: 9 mg/dL (ref 8.7–10.2)
Chloride: 102 mmol/L (ref 96–106)
Creatinine, Ser: 0.79 mg/dL (ref 0.76–1.27)
GFR calc Af Amer: 116 mL/min/{1.73_m2} (ref >59)
GFR calc non Af Amer: 100 mL/min/{1.73_m2} (ref >59)
Glucose: 111 mg/dL — ABNORMAL HIGH (ref 65–99)
Potassium: 4.6 mmol/L (ref 3.5–5.2)
Sodium: 139 mmol/L (ref 134–144)

## 2019-11-25 ENCOUNTER — Ambulatory Visit: Payer: Self-pay | Attending: Internal Medicine | Admitting: Physical Therapy

## 2019-11-25 ENCOUNTER — Other Ambulatory Visit: Payer: Self-pay

## 2019-11-25 ENCOUNTER — Encounter: Payer: Self-pay | Admitting: Physical Therapy

## 2019-11-25 VITALS — HR 95

## 2019-11-25 DIAGNOSIS — Z7409 Other reduced mobility: Secondary | ICD-10-CM | POA: Insufficient documentation

## 2019-11-25 DIAGNOSIS — U099 Post covid-19 condition, unspecified: Secondary | ICD-10-CM | POA: Insufficient documentation

## 2019-11-25 NOTE — Patient Instructions (Signed)
Access Code: [email or web address]: [email or web address]: 10/25/2021Prepared by: Victorino Dike PaaExercises  Sit to Stand - 2-3 x daily - 7 x weekly - 2 sets - 10 reps - 5 hold  Seated March - 2-3 x daily - 7 x weekly - 2 sets - 10 reps - 5 hold  Seated Long Arc Quad - 2-3 x daily - 7 x weekly - 2 sets - 10 reps - 5 hold  Seated Calf Stretch with Strap - 2-3 x daily - 7 x weekly - 1 sets - 10 reps - 30 hold

## 2019-11-25 NOTE — Therapy (Signed)
[facility] Outpatient [facility] [address] [place], [place], [address] Phone: [phone]   Fax:  [phone]  Physical Therapy Evaluation  Patient Details  Name: Brandon Snow MRN: [id number] Date of Birth: 1960-05-12 Referring Provider (PT): Dr. Susa Raring   Encounter Date: 11/25/2019   PT End of Session - 11/25/19 1251    Visit Number 1    PT Start Time 1134    PT Stop Time 1230    PT Time Calculation (min) 56 min    Equipment Utilized During Treatment Oxygen;Gait belt    Activity Tolerance Patient limited by fatigue    Behavior During Therapy [name] for tasks assessed/performed           Past Medical History:  Diagnosis Date  . Hypertension     History reviewed. No pertinent surgical history.  Vitals:   11/25/19 1250  Pulse: 95  SpO2: 96%      Subjective Assessment - 11/25/19 1143    Subjective This patient was discharged from the hospital.  He is struggling with basic ADLs, ambulation and has also dealing with anxiety.  His sons help him with his basic needs. He has some calf pain when walking.    Patient is accompained by: Family member   sons   Pertinent History admitted 9/29 with Acute hypoxic respiratory failure, ARDS, non vaccinated .  Discharged 11/14/19.    Limitations Walking;House hold activities;Standing;Lifting    Patient Stated Goals I wanna get back to normal.    Currently in Pain? No/denies              [facility] PT Assessment - 11/25/19 0001      Assessment   Medical Diagnosis pneumonia post COVID     Referring Provider (PT) Dr. Susa Raring    Onset Date/Surgical Date 10/30/19    Hand Dominance Right    Next MD Visit 11/26/19    Prior Therapy no       Precautions   Precautions Other (comment)    Precaution Comments O2 4 L       Restrictions   Weight Bearing Restrictions No      Balance Screen   Has the patient fallen in the past 6 months No      Home Environment   Living Environment  Private residence    Living Arrangements Children    Available Help at Discharge Family;Friend(s)    Type of Home Apartment    Home Access Stairs to enter    Entrance Stairs-Number of Steps 12   sits and "bumps up" 3-4 steps at a time.    Entrance Stairs-Rails Left    Home Layout One level    Home Equipment Walker - 2 wheels    Additional Comments son will be here another month, fiance.  Im never alone       Prior Function   Level of Independence Requires assistive device for independence;Needs assistance with ADLs;Needs assistance with homemaking;Needs assistance with gait;Needs assistance with transfers    Vocation Unemployed    Vocation Requirements car detail business owner     Leisure working       Cognition   Overall Cognitive Status Within Functional Limits for tasks assessed      Observation/Other Assessments   Focus on Therapeutic Outcomes (FOTO)  NT due to 1 time visit       Sensation   Light Touch Appears Intact    Additional Comments lower legs and feet very cold  Coordination   Gross Motor Movements are Fluid and Coordinated Not tested      Posture/Postural Control   Posture/Postural Control Postural limitations    Postural Limitations Rounded Shoulders;Forward head;Flexed trunk      AROM   Overall AROM Comments ankles lacking Dorsiflexion       Strength   Overall Strength Comments LE and UE WFL     Right Hand Grip (lbs) 71    Left Hand Grip (lbs) 42      Transfers   Five time sit to stand comments  21 sec from walker with UEs      Comments o2 sats decreased to 74%       Ambulation/Gait   Ambulation Distance (Feet) 34 Feet    Assistive device Rolling walker    Ambulation Surface Level;Indoor    Gait velocity .4 feet/sec    Gait Comments slow pace due to endurance             Objective measurements completed on examination: See above findings.      PT Education - 11/25/19 1250    Education Details Home HEalth PT vs Outpatient PT,  Financial Asst.  O2 sat, activity recommendations, walk 10 feet x 2 -3 x 3 per day. HEP    Person(s) Educated Patient    Methods Explanation;Handout    Comprehension Verbalized understanding                       Plan - 11/25/19 1251    Clinical Impression Statement Patient with post COVID deconditioning and limitations in functional activity due to pneumonia and respiratory impairments.  He is dependent on others for mobility in his home.  HE is on 4L o2 and during his session he noted he had an empty tank. Desaturated to 74% once 4L o2 applied with sit to stand.  Ambulated with close supervision (for 02) slowly, 34 feet "the farthest I've ever walked"  No safety concerns. Of concern is that he has a flight of stairs to enter his apt.  At this time I feel he is not ready for Outpatient Rehab , but would benefit from Home Health PT to ensure safe transition to his home environment, to gain confidence and more independence with his abilities.    Personal Factors and Comorbidities Social Background;Comorbidity 1    Comorbidities lives alone, uninsured, HTN, hospital admission    Examination-Activity Limitations Bathing;Lift;Stand;Bed Mobility;Toileting;Locomotion Level;Reach Overhead;Transfers;Carry;Dressing;Stairs    Examination-Participation Restrictions Occupation;Interpersonal Relationship;Shop;Driving;Community Activity    Stability/Clinical Decision Making Unstable/Unpredictable    Clinical Decision Making High    Rehab Potential Good    PT Frequency One time visit    PT Treatment/Interventions Functional mobility training;Patient/family education    PT Next Visit Plan See home health    PT Home Exercise Plan see Pt instructions    Recommended Other Services HHPT    Consulted and Agree with Plan of Care Patient;Family member/caregiver    Family Member Consulted son [name]           Patient will benefit from skilled therapeutic intervention in order to improve the following  deficits and impairments:  Cardiopulmonary status limiting activity, Decreased activity tolerance, Decreased endurance, Difficulty walking, Decreased mobility, Increased fascial restricitons  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 Physical deconditioning 11/18/2019  . Sepsis (HCC) 10/31/2019  . Acute respiratory failure with hypoxemia (HCC) 10/31/2019  .  Elevated transaminase level 10/31/2019  . Essential hypertension 10/31/2019  . Pneumonia due to COVID-19 virus 10/30/2019  . Right knee pain 11/26/2010    Kessler Kopinski 11/25/2019, 1:04 PM  [facility] [address] [place], [place], [address] Phone: [phone]   Fax:  [phone]  Name: Brandon Snow MRN: [id number] Date of Birth: November 09, 1960   Karie Mainland, PT 11/25/19 1:05 PM Phone: [phone] Fax: [phone]

## 2019-11-26 ENCOUNTER — Ambulatory Visit (INDEPENDENT_AMBULATORY_CARE_PROVIDER_SITE_OTHER): Payer: HRSA Program | Admitting: Nurse Practitioner

## 2019-11-26 ENCOUNTER — Ambulatory Visit
Admission: RE | Admit: 2019-11-26 | Discharge: 2019-11-26 | Disposition: A | Discharge status: Home / Self Care | Payer: No Typology Code available for payment source | Source: Ambulatory Visit | Attending: Nurse Practitioner | Admitting: Nurse Practitioner

## 2019-11-26 VITALS — BP 110/74 | HR 90 | Temp 97.5°F

## 2019-11-26 DIAGNOSIS — J9601 Acute respiratory failure with hypoxia: Secondary | ICD-10-CM

## 2019-11-26 DIAGNOSIS — U071 COVID-19: Secondary | ICD-10-CM

## 2019-11-26 DIAGNOSIS — J1282 Pneumonia due to coronavirus disease 2019: Secondary | ICD-10-CM

## 2019-11-26 DIAGNOSIS — R0602 Shortness of breath: Secondary | ICD-10-CM

## 2019-11-26 DIAGNOSIS — F411 Generalized anxiety disorder: Secondary | ICD-10-CM

## 2019-11-26 MED ORDER — ESCITALOPRAM OXALATE 10 MG PO TABS: 10.0000 mg | ORAL_TABLET | Freq: Every day | ORAL | 0 refills | Status: DC

## 2019-11-26 NOTE — Assessment & Plan Note (Signed)
Acute hypoxic respiratory failure Deconditioning:  Stay well hydrated  Stay active  Deep breathing exercises  May take tylenol or fever or pain  May take mucinex twice daily  Will order chest x ray  Continue O2 at 4 L [place]  Continue PT  Continue Eliquis as directed at ED D/C  Will place referral to Pulmonary   Anxiety:  Will order lexapro   Follow up:  Follow up in 1 month or sooner if needed

## 2019-11-26 NOTE — Progress Notes (Signed)
  ID: Brandon Snow, male    DOB: [birth year], 59 y.o.   MRN: [id number]  Chief Complaint  Patient presents with  . Follow-up    Requesting different anxiety. Had appointment with PT they suggested home health but he is uninsured.     Referring provider: No ref. provider found   59 year old male with history of hypertension.  HPI  Patient presents today for post COVID care clinic visit/hospital follow-up.  Patient was admitted to the hospital on 10/30/2019 through 11/14/2019.  Patient was admitted to acute hypoxic respiratory failure due to Covid pneumonia.  Patient was treated with IV steroids, remdesivir, baricitinib.  Patient initially required 20 L of O2 via nasal cannula but was discharged home on 3 to 4 L of O2 continuous.  Patient was also discharged home on Eliquis due to elevated D-dimer.  Patient states since hospital discharge she has been doing well.  Patient is very deconditioned.  He comes into the office today in a wheelchair.  He is still on 4 L of O2 per nasal cannula.  He states that at times when he has at home resting in the chair he does go without his oxygen. Patient did not get chest xray completed at visit as ordered. He states that he will go today.  Patient has been compliant with deep breathing exercises. He has started PT yesterday.   Patient also complains today of anxiety. He states that the vistaril ordered at last is not helping. Will order lexapro today. Black box warning discussed in visit today.   Denies f/c/s, n/v/d, hemoptysis, PND, leg. chest pain or edema.       No Known Allergies  There is no immunization history for the selected administration types on file for this patient.  Past Medical History:  Diagnosis Date  . Hypertension     Tobacco History: Social History   Tobacco Use  Smoking Status Current Some Day Smoker  . Packs/day: 0.50  . Types: Cigarettes  Smokeless Tobacco Never Used   Ready to quit: No Counseling  given: Yes   Outpatient Encounter Medications as of 11/26/2019  Medication Sig  . albuterol (VENTOLIN HFA) 108 (90 Base) MCG/ACT inhaler Inhale 2 puffs into the lungs every 6 (six) hours as needed for wheezing or shortness of breath.  Marland Kitchen apixaban (ELIQUIS) 2.5 MG TABS tablet Take 1 tablet (2.5 mg total) by mouth 2 (two) times daily for 14 days.  . benzonatate (TESSALON) 100 MG capsule Take 2 capsules (200 mg total) by mouth 2 (two) times daily as needed for cough.  . hydrOXYzine (ATARAX/VISTARIL) 10 MG tablet Take 1 tablet (10 mg total) by mouth 3 (three) times daily as needed.  . metoprolol tartrate (LOPRESSOR) 50 MG tablet Take 1 tablet (50 mg total) by mouth 2 (two) times daily.  . Multiple Vitamin (MULTIVITAMIN) tablet Take 1 tablet by mouth daily.  . predniSONE (DELTASONE) 5 MG tablet Label  & dispense according to the schedule below. take 6 Pills PO for 3 days, 4 Pills PO for 3 days, 2 Pills PO for 3 days, 1 Pills PO for 3 days, 1/2 Pill  PO for 3 days then STOP.  Marland Kitchen Ascorbic Acid (VITAMIN C) 1000 MG tablet Take 1,000 mg by mouth daily. (Patient not taking: Reported on 11/25/2019)  . escitalopram (LEXAPRO) 10 MG tablet Take 1 tablet (10 mg total) by mouth daily.  Marland Kitchen tetrahydrozoline-zinc (VISINE-AC) 0.05-0.25 % ophthalmic solution Place 1 drop into both eyes 3 (three) times daily as needed (  dry eyes). (Patient not taking: Reported on 11/25/2019)   No facility-administered encounter medications on file as of 11/26/2019.     Review of Systems  Review of Systems  Constitutional: Positive for fatigue. Negative for fever.  HENT: Negative.   Respiratory: Positive for cough and shortness of breath.   Cardiovascular: Negative.  Negative for chest pain, palpitations and leg swelling.  Gastrointestinal: Negative.   Allergic/Immunologic: Negative.   Neurological: Positive for weakness.  Psychiatric/Behavioral: Negative.        Physical Exam  BP 110/74 (BP Location: Left Arm)   Pulse 90    Temp (!) 97.5 F (36.4 C)   SpO2 95%   Wt Readings from Last 5 Encounters:  10/31/19 287 lb 12.8 oz (130.5 kg)  12/22/18 274 lb 14.6 oz (124.7 kg)  10/15/16 275 lb (124.7 kg)  07/08/15 285 lb (129.3 kg)  04/06/14 294 lb 1 oz (133.4 kg)     Physical Exam Vitals and nursing note reviewed.  Constitutional:      General: He is not in acute distress.    Appearance: He is well-developed.  Cardiovascular:     Rate and Rhythm: Normal rate and regular rhythm.  Pulmonary:     Effort: Pulmonary effort is normal.     Breath sounds: Rhonchi present.  Musculoskeletal:     Right lower leg: No edema.     Left lower leg: No edema.  Skin:    General: Skin is warm and dry.  Neurological:     Mental Status: He is alert and oriented to person, place, and time.  Psychiatric:        Mood and Affect: Mood normal.        Behavior: Behavior normal.      Lab Results:  CBC    Component Value Date/Time   WBC 8.7 11/14/2019 0058   RBC 4.43 11/14/2019 0058   HGB 14.1 11/14/2019 0058   HCT 42.3 11/14/2019 0058   PLT 258 11/14/2019 0058   MCV 95.5 11/14/2019 0058   MCH 31.8 11/14/2019 0058   MCHC 33.3 11/14/2019 0058   RDW 11.9 11/14/2019 0058   LYMPHSABS 1.0 11/09/2019 0408   MONOABS 0.8 11/09/2019 0408   EOSABS 0.0 11/09/2019 0408   BASOSABS 0.0 11/09/2019 0408    BMET    Component Value Date/Time   NA 139 11/18/2019 1156   K 4.6 11/18/2019 1156   CL 102 11/18/2019 1156   CO2 23 11/18/2019 1156   GLUCOSE 111 (H) 11/18/2019 1156   GLUCOSE 103 (H) 11/13/2019 0247   BUN 10 11/18/2019 1156   CREATININE 0.79 11/18/2019 1156   CALCIUM 9.0 11/18/2019 1156   GFRNONAA 100 11/18/2019 1156   GFRNONAA >60 11/13/2019 0247   GFRAA 116 11/18/2019 1156    BNP    Component Value Date/Time   BNP 22.4 11/13/2019 0247    ProBNP No results found for: PROBNP  Imaging: CT Angio Chest PE W and/or Wo Contrast  Result Date: 10/30/2019 CLINICAL DATA:  COVID-19 positive, worsening  shortness of breath at home, O2 sat 77% on room air EXAM: CT ANGIOGRAPHY CHEST WITH CONTRAST TECHNIQUE: Multidetector CT imaging of the chest was performed using the standard protocol during bolus administration of intravenous contrast. Multiplanar CT image reconstructions and MIPs were obtained to evaluate the vascular anatomy. CONTRAST:  100mL OMNIPAQUE IOHEXOL 350 MG/ML SOLN COMPARISON:  Radiograph 10/30/2019 FINDINGS: Cardiovascular: Satisfactory opacification the pulmonary arteries to the segmental level. No pulmonary artery filling defects are identified. Central pulmonary arteries  are normal caliber. The aortic root is suboptimally assessed given cardiac pulsation artifact. The aorta is normal caliber. No acute luminal abnormality of the imaged aorta. No periaortic stranding or hemorrhage. Normal 3 vessel branching of the aortic arch. Proximal great vessels are unremarkable. Normal heart size. No pericardial effusion. No major venous abnormalities. Mediastinum/Nodes: No mediastinal fluid or gas. Normal thyroid gland and thoracic inlet. No acute abnormality of the trachea or esophagus. Scattered borderline enlarged nodes with preserved nodal architecture within the mediastinum and hila for instance a 12 mm left paratracheal node (4/37), 10 mm right paratracheal node (4/37), and an 11 mm right hilar node (4/51) are favored to be reactive. No worrisome enlarged mediastinal, hilar or axillary adenopathy. Lungs/Pleura: Multifocal areas of mixed consolidative and ground-glass opacity with interstitial/septal thickening in a crazy paving pattern which is an often reported imaging feature COVID 19 among other infectious and inflammatory etiologies. No pneumothorax or effusion. Mild diffuse airways thickening. Upper Abdomen: No acute abnormalities present in the visualized portions of the upper abdomen. Musculoskeletal: No acute osseous abnormality or suspicious osseous lesion. Multilevel degenerative changes are  present in the imaged portions of the spine. No worrisome chest wall lesions. Review of the MIP images confirms the above findings. IMPRESSION: 1. No evidence of pulmonary embolism. 2. Multifocal areas of mixed consolidative and ground-glass opacity with interstitial/septal thickening compatible with reported imaging features of COVID 19. 3. Scattered borderline enlarged nodes within the mediastinum and hila, favored to be reactive. Electronically Signed   By: Kreg Shropshire M.D.   On: 10/30/2019 20:59   DG Chest Port 1 View  Result Date: 11/05/2019 CLINICAL DATA:  Shortness of breath, COVID positive EXAM: PORTABLE CHEST 1 VIEW COMPARISON:  Chest radiograph dated 11/01/2019. FINDINGS: The cardiac silhouette is obscured, but appears unchanged. Moderate bilateral airspace opacities appear decreased on the right and unchanged on the left. No large pleural effusion is identified. There is no pneumothorax. The osseous structures are unremarkable. IMPRESSION: Moderate bilateral airspace opacities, decreased on the right and unchanged on the left. Electronically Signed   By: Romona Curls M.D.   On: 11/05/2019 08:45   DG Chest Port 1 View  Result Date: 11/01/2019 CLINICAL DATA:  COVID-19 positive with airspace opacity EXAM: PORTABLE CHEST 1 VIEW COMPARISON:  Chest radiograph and chest CT October 30, 2019 FINDINGS: Airspace opacity is again noted throughout much of the right lung with greatest concentration of opacity in the right mid and lower lung regions. Subtle opacity noted in the left mid and lower lung regions. Stable cardiac enlargement. Pulmonary vascularity normal. No adenopathy. No bone lesions. IMPRESSION: Stable multifocal airspace opacity, consistent with known atypical organism pneumonia. No new areas of opacity evident. Stable cardiac prominence. No adenopathy appreciable. Electronically Signed   By: Bretta Bang III M.D.   On: 11/01/2019 08:26   DG Chest Port 1 View  Result Date:  10/30/2019 CLINICAL DATA:  Shortness of breath.  COVID. EXAM: PORTABLE CHEST 1 VIEW COMPARISON:  10/15/2016 FINDINGS: Multifocal airspace opacities throughout the right greater than left lungs. No visible pleural effusions. No pneumothorax. Cardiac silhouette is accentuated by low lung volumes and portable technique. No acute osseous abnormality. IMPRESSION: Multifocal right greater than left airspace opacities, compatible with multifocal pneumonia and reported history of COVID. Electronically Signed   By: Feliberto Harts MD   On: 10/30/2019 19:36   VAS [place] LOWER EXTREMITY VENOUS (DVT)  Result Date: 11/03/2019  Lower Venous DVTStudy Indications: D-dimer.  Anticoagulation: Lovenox. Comparison Study: No prior studies.  Performing Technologist: Jean Rosenthal  Examination Guidelines: A complete evaluation includes B-mode imaging, spectral Doppler, color Doppler, and power Doppler as needed of all accessible portions of each vessel. Bilateral testing is considered an integral part of a complete examination. Limited examinations for reoccurring indications may be performed as noted. The reflux portion of the exam is performed with the patient in reverse Trendelenburg.  +---------+---------------+---------+-----------+----------+--------------+ RIGHT    CompressibilityPhasicitySpontaneityPropertiesThrombus Aging +---------+---------------+---------+-----------+----------+--------------+ CFV      Full           Yes      Yes                                 +---------+---------------+---------+-----------+----------+--------------+ SFJ      Full                                                        +---------+---------------+---------+-----------+----------+--------------+ FV Prox  Full                                                        +---------+---------------+---------+-----------+----------+--------------+ FV Mid   Full                                                         +---------+---------------+---------+-----------+----------+--------------+ FV DistalFull                                                        +---------+---------------+---------+-----------+----------+--------------+ PFV      Full                                                        +---------+---------------+---------+-----------+----------+--------------+ POP      Full           Yes      Yes                                 +---------+---------------+---------+-----------+----------+--------------+ PTV      Full                                                        +---------+---------------+---------+-----------+----------+--------------+ PERO     Full                                                        +---------+---------------+---------+-----------+----------+--------------+   +---------+---------------+---------+-----------+----------+--------------+  LEFT     CompressibilityPhasicitySpontaneityPropertiesThrombus Aging +---------+---------------+---------+-----------+----------+--------------+ CFV      Full           Yes      Yes                                 +---------+---------------+---------+-----------+----------+--------------+ SFJ      Full                                                        +---------+---------------+---------+-----------+----------+--------------+ FV Prox  Full                                                        +---------+---------------+---------+-----------+----------+--------------+ FV Mid   Full                                                        +---------+---------------+---------+-----------+----------+--------------+ FV DistalFull                                                        +---------+---------------+---------+-----------+----------+--------------+ PFV      Full                                                         +---------+---------------+---------+-----------+----------+--------------+ POP      Full           Yes      Yes                                 +---------+---------------+---------+-----------+----------+--------------+ PTV      Full                                                        +---------+---------------+---------+-----------+----------+--------------+ PERO     Full                                                        +---------+---------------+---------+-----------+----------+--------------+     Summary: RIGHT: - There is no evidence of deep vein thrombosis in the lower extremity.  - No cystic structure found in the popliteal fossa.  LEFT: - There is no evidence of deep vein thrombosis in the lower extremity.  - No  cystic structure found in the popliteal fossa.  *See table(s) above for measurements and observations. Electronically signed by Coral Else MD on 11/03/2019 at 7:41:49 PM.    Final    ECHOCARDIOGRAM LIMITED  Result Date: 11/13/2019    ECHOCARDIOGRAM LIMITED REPORT   Patient Name:   Brandon Snow Date of Exam: 11/13/2019 Medical Rec #:  [id number]        Height:       69.0 in Accession #:    [id number]       Weight:       287.8 lb Date of Birth:  1960-06-19       BSA:          2.412 m Patient Age:    58 years         BP:           107/75 mmHg Patient Gender: M                HR:           89 bpm. Exam Location:  Inpatient Procedure: Limited Echo, Limited Color Doppler and Cardiac Doppler Indications:    acute diastolic chf 428.31  History:        Patient has no prior history of Echocardiogram examinations.                 Covid; Risk Factors:Hypertension.  Sonographer:    Delcie Roch Referring Phys: Effie Shy PRASHANT [facility] IMPRESSIONS  1. Left ventricular ejection fraction, by estimation, is 60 to 65%. The left ventricle has normal function. The left ventricle has no regional wall motion abnormalities. There is mild concentric left ventricular hypertrophy.  Left ventricular diastolic parameters are consistent with Grade I diastolic dysfunction (impaired relaxation).  2. Right ventricular systolic function is normal. The right ventricular size is normal.  3. The mitral valve is normal in structure. No evidence of mitral valve regurgitation.  4. The aortic valve is tricuspid. There is mild thickening of the aortic valve. Aortic valve regurgitation is not visualized.  5. The inferior vena cava is normal in size with greater than 50% respiratory variability, suggesting right atrial pressure of 3 mmHg. Comparison(s): No prior Echocardiogram. FINDINGS  Left Ventricle: Left ventricular ejection fraction, by estimation, is 60 to 65%. The left ventricle has normal function. The left ventricle has no regional wall motion abnormalities. There is mild concentric left ventricular hypertrophy. Left ventricular diastolic parameters are consistent with Grade I diastolic dysfunction (impaired relaxation). Right Ventricle: The right ventricular size is normal. Right ventricular systolic function is normal. Left Atrium: Left atrial size was normal in size. Right Atrium: Right atrial size was normal in size. Pericardium: There is no evidence of pericardial effusion. Mitral Valve: The mitral valve is normal in structure. There is mild thickening of the mitral valve leaflet(s). Mild mitral annular calcification. Tricuspid Valve: The tricuspid valve is normal in structure. Tricuspid valve regurgitation is trivial. Aortic Valve: The aortic valve is tricuspid. There is mild thickening of the aortic valve. Aortic valve regurgitation is not visualized. Pulmonic Valve: The pulmonic valve was normal in structure. Pulmonic valve regurgitation is trivial. Aorta: The aortic root is normal in size and structure. Venous: The inferior vena cava is normal in size with greater than 50% respiratory variability, suggesting right atrial pressure of 3 mmHg. LEFT VENTRICLE PLAX 2D LVIDd:         4.00 cm   Diastology LVIDs:         2.60 cm  LV e' medial:    7.29 cm/s LV PW:         1.10 cm  LV E/e' medial:  9.3 LV IVS:        0.90 cm  LV e' lateral:   6.96 cm/s LVOT diam:     2.40 cm  LV E/e' lateral: 9.7 LV SV:         105 LV SV Index:   44 LVOT Area:     4.52 cm  IVC IVC diam: 1.60 cm LEFT ATRIUM         Index LA diam:    4.20 cm 1.74 cm/m  AORTIC VALVE LVOT Vmax:   129.00 cm/s LVOT Vmean:  89.100 cm/s LVOT VTI:    0.232 m  AORTA Ao Root diam: 3.60 cm MITRAL VALVE MV Area (PHT): 3.53 cm    SHUNTS MV Decel Time: 215 msec    Systemic VTI:  0.23 m MV E velocity: 67.70 cm/s  Systemic Diam: 2.40 cm MV A velocity: 72.80 cm/s MV E/A ratio:  0.93 Laurance Flatten MD Electronically signed by Laurance Flatten MD Signature Date/Time: 11/13/2019/5:23:52 PM    Final      Assessment & Plan:   Pneumonia due to COVID-19 virus Acute hypoxic respiratory failure Deconditioning:  Stay well hydrated  Stay active  Deep breathing exercises  May take tylenol or fever or pain  May take mucinex twice daily  Will order chest x ray  Continue O2 at 4 L [place]  Continue PT  Continue Eliquis as directed at ED D/C  Will place referral to Pulmonary   Anxiety:  Will order lexapro   Follow up:  Follow up in 1 month or sooner if needed      Ivonne Andrew, NP 11/26/2019

## 2019-11-26 NOTE — Patient Instructions (Signed)
Pneumonia due to COVID-19 virus Acute hypoxic respiratory failure Deconditioning:  Stay well hydrated  Stay active  Deep breathing exercises  May take tylenol or fever or pain  May take mucinex twice daily  Will order chest x ray  Continue O2 at 4 L [place]  Continue PT  Continue Eliquis as directed at ED D/C  Will place referral to Pulmonary   Anxiety:  Will order lexapro   Follow up:  Follow up in 1 month or sooner if needed

## 2019-11-27 ENCOUNTER — Other Ambulatory Visit: Payer: Self-pay | Admitting: Nurse Practitioner

## 2019-11-27 DIAGNOSIS — F419 Anxiety disorder, unspecified: Secondary | ICD-10-CM

## 2019-11-27 MED ORDER — HYDROXYZINE HCL 10 MG PO TABS: 10.0000 mg | ORAL_TABLET | Freq: Three times a day (TID) | ORAL | 0 refills | Status: DC | PRN

## 2019-12-05 ENCOUNTER — Ambulatory Visit: Payer: Self-pay

## 2019-12-17 ENCOUNTER — Other Ambulatory Visit: Payer: Self-pay | Admitting: Nurse Practitioner

## 2019-12-17 ENCOUNTER — Telehealth: Payer: Self-pay | Admitting: Nurse Practitioner

## 2019-12-17 DIAGNOSIS — F411 Generalized anxiety disorder: Secondary | ICD-10-CM

## 2019-12-17 MED ORDER — ESCITALOPRAM OXALATE 10 MG PO TABS: 10.0000 mg | ORAL_TABLET | Freq: Every day | ORAL | 0 refills | Status: DC

## 2019-12-17 NOTE — Telephone Encounter (Signed)
Patient called with an update. He states that he is much improved. He is no longer wheel chair bond. He states that the Lexapro has really improved his anxiety. He is requesting a refill. He also states that he has an appointment scheduled with pulmonary for next week.

## 2019-12-25 ENCOUNTER — Institutional Professional Consult (permissible substitution): Payer: No Typology Code available for payment source | Admitting: Emergency Medicine

## 2019-12-30 ENCOUNTER — Ambulatory Visit (INDEPENDENT_AMBULATORY_CARE_PROVIDER_SITE_OTHER): Payer: HRSA Program | Admitting: Nurse Practitioner

## 2019-12-30 VITALS — BP 128/84 | HR 95 | Temp 97.3°F | Ht 68.0 in | Wt 299.0 lb

## 2019-12-30 DIAGNOSIS — J1282 Pneumonia due to coronavirus disease 2019: Secondary | ICD-10-CM

## 2019-12-30 DIAGNOSIS — F419 Anxiety disorder, unspecified: Secondary | ICD-10-CM

## 2019-12-30 DIAGNOSIS — U071 COVID-19: Secondary | ICD-10-CM

## 2019-12-30 DIAGNOSIS — R5381 Other malaise: Secondary | ICD-10-CM

## 2019-12-30 DIAGNOSIS — J9601 Acute respiratory failure with hypoxia: Secondary | ICD-10-CM

## 2019-12-30 NOTE — Progress Notes (Signed)
@Patient  ID: , male    DOB: 1960/04/04, 59 y.o.   MRN: [id number]  Chief Complaint  Patient presents with  . Follow-up    no questions or concerns, no sx    Referring provider: No ref. provider found   59 year old male with history of hypertension.  HPI  Patient presents today for post COVID care clinic visit/hospital follow-up. Patient was admitted to the hospital on 10/30/2019 through 11/14/2019. Patient was admitted with acute hypoxic respiratory failure due to Covid pneumonia. Patient was treated with IV steroids, remdesivir, baricitinib. Patient initially required 20 L of O2 via nasal cannula but was discharged home on 3 to 4 L of O2 continuous. Patient was also discharged home on Eliquis due to elevated D-dimer.  Patient was last seen in our office on 11/26/2019.  He was started on Lexapro for anxiety.  He states that this has significantly helped.  He continues to improve. He no longer needs the wheelchair. He is more active.  He only needs his oxygen for short periods of time with exertion. Patient has an upcoming appointment with pulmonary next week. We are going to schedule an appointment for him to establish care with a PCP before he leaves today. Patient was unable to do PT due to financial concerns. Will give handout on home PT exercises. Denies f/c/s, n/v/d, hemoptysis, PND, chest pain or edema.      No Known Allergies  There is no immunization history for the selected administration types on file for this patient.  Past Medical History:  Diagnosis Date  . Hypertension     Tobacco History: Social History   Tobacco Use  Smoking Status Current Some Day Smoker  . Packs/day: 0.50  . Types: Cigarettes  Smokeless Tobacco Never Used   Ready to quit: No Counseling given: Yes   Outpatient Encounter Medications as of 12/30/2019  Medication Sig  . albuterol (VENTOLIN HFA) 108 (90 Base) MCG/ACT inhaler Inhale 2 puffs into the lungs every 6 (six)  hours as needed for wheezing or shortness of breath.  . escitalopram (LEXAPRO) 10 MG tablet Take 1 tablet (10 mg total) by mouth daily.  . hydrOXYzine (ATARAX/VISTARIL) 10 MG tablet Take 1 tablet (10 mg total) by mouth 3 (three) times daily as needed.  . metoprolol tartrate (LOPRESSOR) 50 MG tablet Take 1 tablet (50 mg total) by mouth 2 (two) times daily.  . Multiple Vitamin (MULTIVITAMIN) tablet Take 1 tablet by mouth daily.  . [DISCONTINUED] predniSONE (DELTASONE) 5 MG tablet Label  & dispense according to the schedule below. take 6 Pills PO for 3 days, 4 Pills PO for 3 days, 2 Pills PO for 3 days, 1 Pills PO for 3 days, 1/2 Pill  PO for 3 days then STOP.  01/01/2020 apixaban (ELIQUIS) 2.5 MG TABS tablet Take 1 tablet (2.5 mg total) by mouth 2 (two) times daily for 14 days.  . Ascorbic Acid (VITAMIN C) 1000 MG tablet Take 1,000 mg by mouth daily. (Patient not taking: Reported on 11/25/2019)  . benzonatate (TESSALON) 100 MG capsule Take 2 capsules (200 mg total) by mouth 2 (two) times daily as needed for cough. (Patient not taking: Reported on 12/30/2019)  . tetrahydrozoline-zinc (VISINE-AC) 0.05-0.25 % ophthalmic solution Place 1 drop into both eyes 3 (three) times daily as needed (dry eyes). (Patient not taking: Reported on 11/25/2019)   No facility-administered encounter medications on file as of 12/30/2019.     Review of Systems  Review of Systems  Constitutional: Negative.  Negative for fatigue and fever.  HENT: Negative.   Respiratory: Positive for shortness of breath (with exertion - improving). Negative for cough.   Cardiovascular: Negative.  Negative for chest pain, palpitations and leg swelling.  Gastrointestinal: Negative.   Allergic/Immunologic: Negative.   Neurological: Negative.   Psychiatric/Behavioral: Negative.        Physical Exam  BP 128/84 (BP Location: Left Arm)   Pulse 95   Temp (!) 97.3 F (36.3 C)   Ht 5\' 8"  (1.727 m)   Wt 299 lb 0.1 oz (135.6 kg)   SpO2 93%    BMI 45.46 kg/m   Wt Readings from Last 5 Encounters:  12/30/19 299 lb 0.1 oz (135.6 kg)  10/31/19 287 lb 12.8 oz (130.5 kg)  12/22/18 274 lb 14.6 oz (124.7 kg)  10/15/16 275 lb (124.7 kg)  07/08/15 285 lb (129.3 kg)     Physical Exam Vitals and nursing note reviewed.  Constitutional:      General: He is not in acute distress.    Appearance: He is well-developed.  Cardiovascular:     Rate and Rhythm: Normal rate and regular rhythm.  Pulmonary:     Effort: Pulmonary effort is normal.     Comments: Diminished  Skin:    General: Skin is warm and dry.  Neurological:     Mental Status: He is alert and oriented to person, place, and time.  Psychiatric:        Mood and Affect: Mood normal.        Behavior: Behavior normal.      Lab Results:  CBC    Component Value Date/Time   WBC 8.7 11/14/2019 0058   RBC 4.43 11/14/2019 0058   HGB 14.1 11/14/2019 0058   HCT 42.3 11/14/2019 0058   PLT 258 11/14/2019 0058   MCV 95.5 11/14/2019 0058   MCH 31.8 11/14/2019 0058   MCHC 33.3 11/14/2019 0058   RDW 11.9 11/14/2019 0058   LYMPHSABS 1.0 11/09/2019 0408   MONOABS 0.8 11/09/2019 0408   EOSABS 0.0 11/09/2019 0408   BASOSABS 0.0 11/09/2019 0408    BMET    Component Value Date/Time   NA 139 11/18/2019 1156   K 4.6 11/18/2019 1156   CL 102 11/18/2019 1156   CO2 23 11/18/2019 1156   GLUCOSE 111 (H) 11/18/2019 1156   GLUCOSE 103 (H) 11/13/2019 0247   BUN 10 11/18/2019 1156   CREATININE 0.79 11/18/2019 1156   CALCIUM 9.0 11/18/2019 1156   GFRNONAA 100 11/18/2019 1156   GFRNONAA >60 11/13/2019 0247   GFRAA 116 11/18/2019 1156    BNP    Component Value Date/Time   BNP 22.4 11/13/2019 0247    ProBNP No results found for: PROBNP  Imaging: No results found.   Assessment & Plan:   Pneumonia due to COVID-19 virus Acute hypoxic respiratory failure Deconditioning:  Stay well hydrated  Stay active  Deep breathing exercises  May take tylenol for fever or  pain  May take mucinex twice daily  Continue O2 as needed to keep   Continue PT at home  Continue Eliquis as directed at ED D/C  Keep upcoming appointment with Pulmonary   Anxiety:  Continue lexapro   Follow up:  Follow up if needed      11/15/2019, NP 12/31/2019

## 2019-12-30 NOTE — Patient Instructions (Addendum)
Pneumonia due to COVID-19 virus Acute hypoxic respiratory failure Deconditioning:  Stay well hydrated  Stay active  Deep breathing exercises  May take tylenol for fever or pain  May take mucinex twice daily  Continue O2 as needed to keep   Continue PT at home  Continue Eliquis as directed at ED D/C  Keep upcoming appointment with Pulmonary   Anxiety:  Continue lexapro   Follow up:  Follow up if needed

## 2019-12-31 DIAGNOSIS — F419 Anxiety disorder, unspecified: Secondary | ICD-10-CM | POA: Insufficient documentation

## 2019-12-31 NOTE — Assessment & Plan Note (Signed)
Acute hypoxic respiratory failure Deconditioning:  Stay well hydrated  Stay active  Deep breathing exercises  May take tylenol for fever or pain  May take mucinex twice daily  Continue O2 as needed to keep   Continue PT at home  Continue Eliquis as directed at ED D/C  Keep upcoming appointment with Pulmonary   Anxiety:  Continue lexapro   Follow up:  Follow up if needed

## 2020-01-11 ENCOUNTER — Other Ambulatory Visit: Payer: Self-pay | Admitting: Nurse Practitioner

## 2020-01-11 DIAGNOSIS — F411 Generalized anxiety disorder: Secondary | ICD-10-CM

## 2020-01-20 ENCOUNTER — Institutional Professional Consult (permissible substitution): Payer: No Typology Code available for payment source | Admitting: Pulmonary Disease

## 2020-02-24 ENCOUNTER — Ambulatory Visit: Payer: Self-pay | Admitting: Nurse Practitioner

## 2020-05-10 ENCOUNTER — Ambulatory Visit (HOSPITAL_COMMUNITY)
Admission: EM | Admit: 2020-05-10 | Discharge: 2020-05-10 | Disposition: A | Discharge status: Home / Self Care | Payer: 59 | Attending: Family Medicine | Admitting: Family Medicine

## 2020-05-10 ENCOUNTER — Other Ambulatory Visit: Payer: Self-pay

## 2020-05-10 ENCOUNTER — Encounter (HOSPITAL_COMMUNITY): Payer: Self-pay | Admitting: Emergency Medicine

## 2020-05-10 DIAGNOSIS — M5442 Lumbago with sciatica, left side: Secondary | ICD-10-CM

## 2020-05-10 DIAGNOSIS — R030 Elevated blood-pressure reading, without diagnosis of hypertension: Secondary | ICD-10-CM | POA: Diagnosis not present

## 2020-05-10 DIAGNOSIS — R200 Anesthesia of skin: Secondary | ICD-10-CM

## 2020-05-10 MED ORDER — CYCLOBENZAPRINE HCL 10 MG PO TABS: 10.0000 mg | ORAL_TABLET | Freq: Three times a day (TID) | ORAL | 0 refills | Status: DC | PRN

## 2020-05-10 MED ORDER — PREDNISONE 20 MG PO TABS: 40.0000 mg | ORAL_TABLET | Freq: Every day | ORAL | 0 refills | Status: DC

## 2020-05-10 NOTE — ED Triage Notes (Signed)
Pt presents today with c/o of lower back pain that radiates down left leg into toes x 1 wk. Denies injury. +ambulatory

## 2020-05-10 NOTE — ED Provider Notes (Addendum)
[facility]    CSN: [id number] Arrival date & time: 05/10/20  1045      History   Chief Complaint Chief Complaint  Patient presents with  . Back Pain    HPI Brandon Snow is a 60 y.o. male.   Patient presenting today with 1 week history of left lateral low back pain and states if he gets on the treadmill or does something active for any period of time he starts to get a little bit of numbness and tingling sensation down into the left leg.  He states he is able to ambulate well without significant pain and has not had any known injury.  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  He denies leg weakness, bowel or bladder incontinence, saddle paresthesias, mobility limitations.  Has not tried anything other than over ibuprofen over-the-counter which has been helping temporarily.     Past Medical History:  Diagnosis Date  . COVID-19 2021  . Hypertension     Patient Active Problem List   Diagnosis Date Noted  . Anxiety 12/31/2019  . Generalized anxiety disorder 11/26/2019  . Shortness of breath 11/26/2019  . Physical deconditioning 11/18/2019  . Sepsis (HCC) 10/31/2019  . Acute respiratory failure with hypoxemia (HCC) 10/31/2019  . Elevated transaminase level 10/31/2019  . Essential hypertension 10/31/2019  . Pneumonia due to COVID-19 virus 10/30/2019  . Right knee pain 11/26/2010    History reviewed. No pertinent surgical history.     Home Medications    Prior to Admission medications   Medication Sig Start Date End Date Taking? Authorizing Provider  Ascorbic Acid (VITAMIN C) 1000 MG tablet Take 1,000 mg by mouth daily.   Yes [provider]  cyclobenzaprine (FLEXERIL) 10 MG tablet Take 1 tablet (10 mg total) by mouth 3 (three) times daily as needed for muscle spasms. 05/10/20  Yes Particia Nearing, PA-C  Multiple Vitamin (MULTIVITAMIN) tablet Take 1 tablet by mouth daily.   Yes [provider]  predniSONE (DELTASONE) 20 MG tablet Take 2 tablets (40 mg total) by mouth daily with breakfast. 05/10/20  Yes Particia Nearing, PA-C  albuterol (VENTOLIN HFA) 108 (90 Base) MCG/ACT inhaler Inhale 2 puffs into the lungs every 6 (six) hours as needed for wheezing or shortness of breath. 11/14/19   Leroy Sea, MD  apixaban (ELIQUIS) 2.5 MG TABS tablet Take 1 tablet (2.5 mg total) by mouth 2 (two) times daily for 14 days. 11/14/19 11/28/19  Leroy Sea, MD  benzonatate (TESSALON) 100 MG capsule Take 2 capsules (200 mg total) by mouth 2 (two) times daily as needed for cough. Patient not taking: No sig reported 11/14/19   Leroy Sea, MD  escitalopram (LEXAPRO) 10 MG tablet TAKE 1 TABLET BY MOUTH EVERY DAY 01/15/20   Ivonne Andrew, NP  hydrOXYzine (ATARAX/VISTARIL) 10 MG tablet Take 1 tablet (10 mg total) by mouth 3 (three) times daily as needed. 11/27/19   Ivonne Andrew, NP  metoprolol tartrate (LOPRESSOR) 50 MG tablet Take 1 tablet (50 mg total) by mouth 2 (two) times daily. 11/14/19   Leroy Sea, MD  tetrahydrozoline-zinc (VISINE-AC) 0.05-0.25 % ophthalmic solution Place 1 drop into both eyes 3 (three) times daily as needed (dry eyes). Patient not taking: Reported on 11/25/2019    [provider]    Family History Family History  Problem Relation Age of Onset  . Diabetes Mother   . Hyperlipidemia Mother   .  Hyperlipidemia Father   . Hypertension Father   . Hypertension Sister   . Hyperlipidemia Sister   . Diabetes Sister   . Diabetes Maternal Grandfather   . Diabetes Paternal Grandmother   . Hyperlipidemia Paternal Grandmother   . Hypertension Paternal Grandmother   . Sudden death Paternal Grandmother   . Heart attack Neg Hx     Social History Social History   Tobacco Use  . Smoking status: Current Some Day Smoker    Packs/day: 0.50    Types: Cigarettes  . Smokeless tobacco: Never Used  Vaping Use  . Vaping Use: Never  used  Substance Use Topics  . Alcohol use: No  . Drug use: No     Allergies   Patient has no known allergies.   Review of Systems Review of Systems Per HPI  Physical Exam Triage Vital Signs ED Triage Vitals  Enc Vitals Group     BP 05/10/20 1128 (!) 157/97     Pulse Rate 05/10/20 1128 93     Resp 05/10/20 1128 20     Temp 05/10/20 1128 98.2 F (36.8 C)     Temp Source 05/10/20 1128 Oral     SpO2 05/10/20 1128 95 %     Weight --      Height --      Head Circumference --      Peak Flow --      Pain Score 05/10/20 1124 0     Pain Loc --      Pain Edu? --      Excl. in GC? --    No data found.  Updated Vital Signs BP (!) 157/97 (BP Location: Right Arm)   Pulse 93   Temp 98.2 F (36.8 C) (Oral)   Resp 20   SpO2 95%   Visual Acuity Right Eye Distance:   Left Eye Distance:   Bilateral Distance:    Right Eye Near:   Left Eye Near:    Bilateral Near:     Physical Exam Vitals and nursing note reviewed.  Constitutional:      Appearance: Normal appearance.  HENT:     Head: Atraumatic.  Eyes:     Extraocular Movements: Extraocular movements intact.     Conjunctiva/sclera: Conjunctivae normal.  Cardiovascular:     Rate and Rhythm: Normal rate and regular rhythm.  Pulmonary:     Effort: Pulmonary effort is normal.     Breath sounds: Normal breath sounds.  Musculoskeletal:        General: No swelling, tenderness, deformity or signs of injury. Normal range of motion.     Cervical back: Normal range of motion and neck supple.     Comments: No midline tenderness palpation diffusely across spine No reproducible pain on range of motion exam including with resistance.  Excellent mobility with no subjective pain elicited  Skin:    General: Skin is warm and dry.  Neurological:     General: No focal deficit present.     Mental Status: He is oriented to person, place, and time.     Motor: No weakness.     Gait: Gait normal.     Comments: Bilateral lower  extremities neurovascularly intact  Psychiatric:        Mood and Affect: Mood normal.        Thought Content: Thought content normal.        Judgment: Judgment normal.      UC Treatments / Results  Labs (all labs ordered  are listed, but only abnormal results are displayed) Labs Reviewed - No data to display  EKG   Radiology No results found.  Procedures Procedures (including critical care time)  Medications Ordered in UC Medications - No data to display  Initial Impression / Assessment and Plan / UC Course  I have reviewed the triage vital signs and the nursing notes.  Pertinent labs & imaging results that were available during my care of the patient were reviewed by me and considered in my medical decision making (see chart for details).     Exam and vitals very reassuring today, no evidence of spinal trauma/bony injury so will defer x-ray imaging today.  Additionally, no evidence of neurologic compromise at lumbar spine.  Suspect muscular in nature/inflammatory will treat with prednisone, Flexeril, over-the-counter anti-inflammatories, heat Epsom salt soaks, stretches.  Does not currently have a PCP, will enter him into the PCP search database to help him coordinate this.  Return for acutely worsening symptoms in the meantime. Did additionally discuss elevated blood pressure reading in triage today.  Unclear if this is a historical issue for him.  Discussed trying to check it at home as much as able or at the drugstore if not having machine at home.  DASH diet, exercise, stress control reviewed.  Follow-up with primary care soon as possible for this as well.  Final Clinical Impressions(s) / UC Diagnoses   Final diagnoses:  Acute left-sided low back pain with left-sided sciatica  Left leg numbness  Elevated blood pressure reading   Discharge Instructions   None    ED Prescriptions    Medication Sig Dispense Auth. Provider   predniSONE (DELTASONE) 20 MG tablet Take 2  tablets (40 mg total) by mouth daily with breakfast. 10 tablet Particia Nearing, PA-C   cyclobenzaprine (FLEXERIL) 10 MG tablet Take 1 tablet (10 mg total) by mouth 3 (three) times daily as needed for muscle spasms. 15 tablet Particia Nearing, [place]     PDMP not reviewed this encounter.   Particia Nearing, [place] 05/10/20 1230    [address] [place], [place] 05/10/20 1230    Roosvelt Maser [place], [place] 05/10/20 1231

## 2020-05-21 ENCOUNTER — Ambulatory Visit: Payer: No Typology Code available for payment source | Admitting: Nurse Practitioner

## 2020-05-25 ENCOUNTER — Encounter: Payer: Self-pay | Admitting: Nurse Practitioner

## 2020-05-25 ENCOUNTER — Ambulatory Visit (INDEPENDENT_AMBULATORY_CARE_PROVIDER_SITE_OTHER): Payer: 59 | Admitting: Nurse Practitioner

## 2020-05-25 ENCOUNTER — Other Ambulatory Visit: Payer: Self-pay

## 2020-05-25 VITALS — BP 122/81 | HR 83 | Temp 97.7°F | Ht 67.0 in | Wt 316.2 lb

## 2020-05-25 DIAGNOSIS — Z7689 Persons encountering health services in other specified circumstances: Secondary | ICD-10-CM

## 2020-05-25 DIAGNOSIS — Z8616 Personal history of COVID-19: Secondary | ICD-10-CM | POA: Diagnosis not present

## 2020-05-25 DIAGNOSIS — M5442 Lumbago with sciatica, left side: Secondary | ICD-10-CM | POA: Diagnosis not present

## 2020-05-25 MED ORDER — GABAPENTIN 100 MG PO CAPS: 100.0000 mg | ORAL_CAPSULE | Freq: Every day | ORAL | 1 refills | Status: DC

## 2020-05-25 NOTE — Patient Instructions (Signed)
 Sciatica  Sciatica is pain, weakness, tingling, or loss of feeling (numbness) along the sciatic nerve. The sciatic nerve starts in the lower back and goes down the back of each leg. Sciatica usually goes away on its own or with treatment. Sometimes, sciatica may come back (recur). What are the causes? This condition happens when the sciatic nerve is pinched or has pressure put on it. This may be the result of:  A disk in between the bones of the spine bulging out too far (herniated disk).  Changes in the spinal disks that occur with aging.  A condition that affects a muscle in the butt.  Extra bone growth near the sciatic nerve.  A break (fracture) of the area between your hip bones (pelvis).  Pregnancy.  Tumor. This is rare. What increases the risk? You are more likely to develop this condition if you:  Play sports that put pressure or stress on the spine.  Have poor strength and ease of movement (flexibility).  Have had a back injury in the past.  Have had back surgery.  Sit for long periods of time.  Do activities that involve bending or lifting over and over again.  Are very overweight (obese). What are the signs or symptoms? Symptoms can vary from mild to very bad. They may include:  Any of these problems in the lower back, leg, hip, or butt: ? Mild tingling, loss of feeling, or dull aches. ? Burning sensations. ? Sharp pains.  Loss of feeling in the back of the calf or the sole of the foot.  Leg weakness.  Very bad back pain that makes it hard to move. These symptoms may get worse when you cough, sneeze, or laugh. They may also get worse when you sit or stand for long periods of time. How is this treated? This condition often gets better without any treatment. However, treatment may include:  Changing or cutting back on physical activity when you have pain.  Doing exercises and stretching.  Putting ice or heat on the affected area.  Medicines that  help: ? To relieve pain and swelling. ? To relax your muscles.  Shots (injections) of medicines that help to relieve pain, irritation, and swelling.  Surgery. Follow these instructions at home: Medicines  Take over-the-counter and prescription medicines only as told by your doctor.  Ask your doctor if the medicine prescribed to you: ? Requires you to avoid driving or using heavy machinery. ? Can cause trouble pooping (constipation). You may need to take these steps to prevent or treat trouble pooping:  Drink enough fluids to keep your pee (urine) pale yellow.  Take over-the-counter or prescription medicines.  Eat foods that are high in fiber. These include beans, whole grains, and fresh fruits and vegetables.  Limit foods that are high in fat and sugar. These include fried or sweet foods. Managing pain  If told, put ice on the affected area. ? Put ice in a plastic bag. ? Place a towel between your skin and the bag. ? Leave the ice on for 20 minutes, 2-3 times a day.  If told, put heat on the affected area. Use the heat source that your doctor tells you to use, such as a moist heat pack or a heating pad. ? Place a towel between your skin and the heat source. ? Leave the heat on for 20-30 minutes. ? Remove the heat if your skin turns bright red. This is very important if you are unable to feel   pain, heat, or cold. You may have a greater risk of getting burned.      Activity  Return to your normal activities as told by your doctor. Ask your doctor what activities are safe for you.  Avoid activities that make your symptoms worse.  Take short rests during the day. ? When you rest for a long time, do some physical activity or stretching between periods of rest. ? Avoid sitting for a long time without moving. Get up and move around at least one time each hour.  Exercise and stretch regularly, as told by your doctor.  Do not lift anything that is heavier than 10 lb (4.5 kg)  while you have symptoms of sciatica. ? Avoid lifting heavy things even when you do not have symptoms. ? Avoid lifting heavy things over and over.  When you lift objects, always lift in a way that is safe for your body. To do this, you should: ? Bend your knees. ? Keep the object close to your body. ? Avoid twisting.   General instructions  Stay at a healthy weight.  Wear comfortable shoes that support your feet. Avoid wearing high heels.  Avoid sleeping on a mattress that is too soft or too hard. You might have less pain if you sleep on a mattress that is firm enough to support your back.  Keep all follow-up visits as told by your doctor. This is important. Contact a doctor if:  You have pain that: ? Wakes you up when you are sleeping. ? Gets worse when you lie down. ? Is worse than the pain you have had in the past. ? Lasts longer than 4 weeks.  You lose weight without trying. Get help right away if:  You cannot control when you pee (urinate) or poop (have a bowel movement).  You have weakness in any of these areas and it gets worse: ? Lower back. ? The area between your hip bones. ? Butt. ? Legs.  You have redness or swelling of your back.  You have a burning feeling when you pee. Summary  Sciatica is pain, weakness, tingling, or loss of feeling (numbness) along the sciatic nerve.  This condition happens when the sciatic nerve is pinched or has pressure put on it.  Sciatica can cause pain, tingling, or loss of feeling (numbness) in the lower back, legs, hips, and butt.  Treatment often includes rest, exercise, medicines, and putting ice or heat on the affected area. This information is not intended to replace advice given to you by your health care provider. Make sure you discuss any questions you have with your health care provider. Document Revised: 02/05/2018 Document Reviewed: 02/05/2018 Elsevier Patient Education  2021 [employer]     Sciatica  Rehab Ask your health care provider which exercises are safe for you. Do exercises exactly as told by your health care provider and adjust them as directed. It is normal to feel mild stretching, pulling, tightness, or discomfort as you do these exercises. Stop right away if you feel sudden pain or your pain gets worse. Do not begin these exercises until told by your health care provider. Stretching and range-of-motion exercises These exercises warm up your muscles and joints and improve the movement and flexibility of your hips and back. These exercises also help to relieve pain, numbness, and tingling. Sciatic nerve glide 1. Sit in a chair with your head facing down toward your chest. Place your hands behind your back. Let your shoulders slump forward.   2. Slowly straighten one of your legs while you tilt your head back as if you are looking toward the ceiling. Only straighten your leg as far as you can without making your symptoms worse. 3. Hold this position for __________ seconds. 4. Slowly return to the starting position. 5. Repeat with your other leg. Repeat __________ times. Complete this exercise __________ times a day. Knee to chest with hip adduction and internal rotation 1. Lie on your back on a firm surface with both legs straight. 2. Bend one of your knees and move it up toward your chest until you feel a gentle stretch in your lower back and buttock. Then, move your knee toward the shoulder that is on the opposite side from your leg. This is hip adduction and internal rotation. ? Hold your leg in this position by holding on to the front of your knee. 3. Hold this position for __________ seconds. 4. Slowly return to the starting position. 5. Repeat with your other leg. Repeat __________ times. Complete this exercise __________ times a day.   Prone extension on elbows 1. Lie on your abdomen on a firm surface. A bed may be too soft for this exercise. 2. Prop yourself up on your  elbows. 3. Use your arms to help lift your chest up until you feel a gentle stretch in your abdomen and your lower back. ? This will place some of your body weight on your elbows. If this is uncomfortable, try stacking pillows under your chest. ? Your hips should stay down, against the surface that you are lying on. Keep your hip and back muscles relaxed. 4. Hold this position for __________ seconds. 5. Slowly relax your upper body and return to the starting position. Repeat __________ times. Complete this exercise __________ times a day.   Strengthening exercises These exercises build strength and endurance in your back. Endurance is the ability to use your muscles for a long time, even after they get tired. Pelvic tilt This exercise strengthens the muscles that lie deep in the abdomen. 1. Lie on your back on a firm surface. Bend your knees and keep your feet flat on the floor. 2. Tense your abdominal muscles. Tip your pelvis up toward the ceiling and flatten your lower back into the floor. ? To help with this exercise, you may place a small towel under your lower back and try to push your back into the towel. 3. Hold this position for __________ seconds. 4. Let your muscles relax completely before you repeat this exercise. Repeat __________ times. Complete this exercise __________ times a day. Alternating arm and leg raises 1. Get on your hands and knees on a firm surface. If you are on a hard floor, you may want to use padding, such as an exercise mat, to cushion your knees. 2. Line up your arms and legs. Your hands should be directly below your shoulders, and your knees should be directly below your hips. 3. Lift your left leg behind you. At the same time, raise your right arm and straighten it in front of you. ? Do not lift your leg higher than your hip. ? Do not lift your arm higher than your shoulder. ? Keep your abdominal and back muscles tight. ? Keep your hips facing the  ground. ? Do not arch your back. ? Keep your balance carefully, and do not hold your breath. 4. Hold this position for __________ seconds. 5. Slowly return to the starting position. 6. Repeat with your right   leg and your left arm. Repeat __________ times. Complete this exercise __________ times a day.   Posture and body mechanics Good posture and healthy body mechanics can help to relieve stress in your body's tissues and joints. Body mechanics refers to the movements and positions of your body while you do your daily activities. Posture is part of body mechanics. Good posture means:  Your spine is in its natural S-curve position (neutral).  Your shoulders are pulled back slightly.  Your head is not tipped forward. Follow these guidelines to improve your posture and body mechanics in your everyday activities. Standing  When standing, keep your spine neutral and your feet about hip width apart. Keep a slight bend in your knees. Your ears, shoulders, and hips should line up.  When you do a task in which you stand in one place for a long time, place one foot up on a stable object that is 2-4 inches (5-10 cm) high, such as a footstool. This helps keep your spine neutral.   Sitting  When sitting, keep your spine neutral and keep your feet flat on the floor. Use a footrest, if necessary, and keep your thighs parallel to the floor. Avoid rounding your shoulders, and avoid tilting your head forward.  When working at a desk or a computer, keep your desk at a height where your hands are slightly lower than your elbows. Slide your chair under your desk so you are close enough to maintain good posture.  When working at a computer, place your monitor at a height where you are looking straight ahead and you do not have to tilt your head forward or downward to look at the screen.   Resting  When lying down and resting, avoid positions that are most painful for you.  If you have pain with activities  such as sitting, bending, stooping, or squatting, lie in a position in which your body does not bend very much. For example, avoid curling up on your side with your arms and knees near your chest (fetal position).  If you have pain with activities such as standing for a long time or reaching with your arms, lie with your spine in a neutral position and bend your knees slightly. Try the following positions: ? Lying on your side with a pillow between your knees. ? Lying on your back with a pillow under your knees. Lifting  When lifting objects, keep your feet at least shoulder width apart and tighten your abdominal muscles.  Bend your knees and hips and keep your spine neutral. It is important to lift using the strength of your legs, not your back. Do not lock your knees straight out.  Always ask for help to lift heavy or awkward objects.   This information is not intended to replace advice given to you by your health care provider. Make sure you discuss any questions you have with your health care provider. Document Revised: 05/11/2018 Document Reviewed: 02/08/2018 Elsevier Patient Education  2021 [employer] Inc.  

## 2020-05-25 NOTE — Progress Notes (Signed)
New Patient Office Visit  Subjective:  Patient ID: Brandon Snow, male    DOB: 1960/10/31  Age: 60 y.o. MRN: [id number]  CC:  Chief Complaint  Patient presents with  . New Patient (Initial Visit)    HPI Brandon Snow presents to establish new primary care provider. He has not had a PCP in some time. He states that in 11/2019 he was hospitalized for COVID 19 pneumonia. At time of discharge from the hospital, his D-dimer was elevated. He was discharged with prescription for eliquis 2.5mg  twice daily. He is no longer taking this or any other medications at this time.  He is c/o low back pain which radiates into the left hip and down the left leg. Radiating pain leads to numbness and burning sensation of the left leg. States that it is hard to walk and participate in normal activities when his leg is bothering him. He states that he was seen in Urgent Care about two weeks ago because of the low back pain and pain in left leg. He was treated with prednisone taper and muscle relaxer. He states that treatment did help the pain in his lower leg, but did not help the numbness and burning sensation in left leg. He rates this pain as 8/10 in severity. He states that no imaging of the spine was done when he was seen in urgent care.  He is due to have routine, fasting labs.  Due to have routine physical Should be scheduled for screening colonoscopy very soon. He states he has never had colon cancer screening.   Past Medical History:  Diagnosis Date  . COVID-19 2021  . Hypertension     History reviewed. No pertinent surgical history.  Family History  Problem Relation Age of Onset  . Diabetes Mother   . Hyperlipidemia Mother   . Hyperlipidemia Father   . Hypertension Father   . Hypertension Sister   . Hyperlipidemia Sister   . Diabetes Sister   . Diabetes Maternal Grandfather   . Diabetes Paternal Grandmother   . Hyperlipidemia Paternal Grandmother   . Hypertension Paternal  Grandmother   . Sudden death Paternal Grandmother   . Heart attack Neg Hx     Social History   Socioeconomic History  . Marital status: Single    Spouse name: Not on file  . Number of children: Not on file  . Years of education: Not on file  . Highest education level: Not on file  Occupational History  . Not on file  Tobacco Use  . Smoking status: Current Some Day Smoker    Packs/day: 0.50    Types: Cigarettes  . Smokeless tobacco: Never Used  Vaping Use  . Vaping Use: Never used  Substance and Sexual Activity  . Alcohol use: Yes  . Drug use: No  . Sexual activity: Yes    Partners: Female  Other Topics Concern  . Not on file  Social History Narrative  . Not on file   Social Determinants of Health   Financial Resource Strain: Not on file  Food Insecurity: Not on file  Transportation Needs: Not on file  Physical Activity: Not on file  Stress: Not on file  Social Connections: Not on file  Intimate Partner Violence: Not on file    ROS Review of Systems  Constitutional: Positive for activity change. Negative for chills and fever.       States that his activity is limited due to the lower back pain and left  leg burning and numbness.   HENT: Negative for congestion, postnasal drip, rhinorrhea, sinus pressure, sinus pain and tinnitus.   Eyes: Negative.   Respiratory: Negative for cough, chest tightness, shortness of breath and wheezing.   Cardiovascular: Negative for chest pain and palpitations.  Gastrointestinal: Negative for constipation, diarrhea, nausea and vomiting.  Endocrine: Negative.   Genitourinary: Negative.   Musculoskeletal: Positive for arthralgias, back pain and myalgias.       Low back pain which radiates into the left hip and left upper leg. Rates pain as 8/0 in severity.   Skin: Negative.  Negative for rash.  Allergic/Immunologic: Negative for environmental allergies.  Neurological: Negative for dizziness, weakness and headaches.  Hematological:  Negative for adenopathy.  Psychiatric/Behavioral: Negative for dysphoric mood. The patient is not nervous/anxious.   All other systems reviewed and are negative.   Objective:   Today's Vitals   05/25/20 0914  BP: 122/81  Pulse: 83  Temp: 97.7 F (36.5 C)  SpO2: 96%  Weight: (!) 316 lb 3.2 oz (143.4 kg)  Height: 5\' 7"  (1.702 m)   Body mass index is 49.52 kg/m.   Physical Exam Vitals and nursing note reviewed.  Constitutional:      Appearance: Normal appearance. He is well-developed.  HENT:     Head: Normocephalic.     Nose: Nose normal.  Eyes:     Extraocular Movements: Extraocular movements intact.     Conjunctiva/sclera: Conjunctivae normal.     Pupils: Pupils are equal, round, and reactive to light.  Neck:     Vascular: No carotid bruit.  Cardiovascular:     Rate and Rhythm: Normal rate and regular rhythm.     Pulses: Normal pulses.     Heart sounds: Normal heart sounds.  Pulmonary:     Effort: Pulmonary effort is normal.     Breath sounds: Normal breath sounds.  Abdominal:     Palpations: Abdomen is soft.  Musculoskeletal:        General: Normal range of motion.     Cervical back: Normal range of motion and neck supple.     Comments: There are no noted abnormalities or deformities present in the lower back or left leg. He has good ROM of the left leg and walking without a limp. He is able to bend and twist at the waist without limitation at this time.   Skin:    General: Skin is warm and dry.     Capillary Refill: Capillary refill takes less than 2 seconds.  Neurological:     General: No focal deficit present.     Mental Status: He is alert and oriented to person, place, and time.  Psychiatric:        Mood and Affect: Mood normal.        Behavior: Behavior normal.        Thought Content: Thought content normal.        Judgment: Judgment normal.     Assessment & Plan:  1. Encounter to establish care Appointment today to establish new primary care  provider. Will get routine, fasting labs and routine physical at next visit. Discuss screening colonoscopy at next visit.   2. Acute left-sided low back pain with left-sided sciatica Start gabapentin 100mg  every evening. Will titrate dosing as indicated and as tolerated. Will get x-ray of lumbar spine for further evaluation.  - DG Lumbar Spine 2-3 Views; Future - gabapentin (NEURONTIN) 100 MG capsule; Take 1 capsule (100 mg total) by mouth at bedtime.  Dispense: 30 capsule; Refill: 1  3. History of COVID-19 Hospitalization 11/2019 due to COVID 19 pneumonia. No longer on medications prescribed at time of discharge. Will continue to monitor.   Problem List Items Addressed This Visit      Nervous and Auditory   Acute left-sided low back pain with left-sided sciatica   Relevant Medications   gabapentin (NEURONTIN) 100 MG capsule   Other Relevant Orders   DG Lumbar Spine 2-3 Views     Other   Encounter to establish care - Primary   History of COVID-19      Outpatient Encounter Medications as of 05/25/2020  Medication Sig  . gabapentin (NEURONTIN) 100 MG capsule Take 1 capsule (100 mg total) by mouth at bedtime.  . Multiple Vitamin (MULTIVITAMIN) tablet Take 1 tablet by mouth daily.  Marland Kitchen albuterol (VENTOLIN HFA) 108 (90 Base) MCG/ACT inhaler Inhale 2 puffs into the lungs every 6 (six) hours as needed for wheezing or shortness of breath. (Patient not taking: Reported on 05/25/2020)  . apixaban (ELIQUIS) 2.5 MG TABS tablet Take 1 tablet (2.5 mg total) by mouth 2 (two) times daily for 14 days.  . Ascorbic Acid (VITAMIN C) 1000 MG tablet Take 1,000 mg by mouth daily. (Patient not taking: Reported on 05/25/2020)  . benzonatate (TESSALON) 100 MG capsule Take 2 capsules (200 mg total) by mouth 2 (two) times daily as needed for cough. (Patient not taking: Reported on 05/25/2020)  . cyclobenzaprine (FLEXERIL) 10 MG tablet Take 1 tablet (10 mg total) by mouth 3 (three) times daily as needed for muscle  spasms. (Patient not taking: Reported on 05/25/2020)  . escitalopram (LEXAPRO) 10 MG tablet TAKE 1 TABLET BY MOUTH EVERY DAY (Patient not taking: Reported on 05/25/2020)  . hydrOXYzine (ATARAX/VISTARIL) 10 MG tablet Take 1 tablet (10 mg total) by mouth 3 (three) times daily as needed. (Patient not taking: Reported on 05/25/2020)  . metoprolol tartrate (LOPRESSOR) 50 MG tablet Take 1 tablet (50 mg total) by mouth 2 (two) times daily. (Patient not taking: Reported on 05/25/2020)  . predniSONE (DELTASONE) 20 MG tablet Take 2 tablets (40 mg total) by mouth daily with breakfast. (Patient not taking: Reported on 05/25/2020)  . tetrahydrozoline-zinc (VISINE-AC) 0.05-0.25 % ophthalmic solution Place 1 drop into both eyes 3 (three) times daily as needed (dry eyes). (Patient not taking: Reported on 05/25/2020)   No facility-administered encounter medications on file as of 05/25/2020.    Follow-up: Return in about 3 weeks (around 06/15/2020) for physical - FBW one week prior to appointment. please also add PSA to screen for prostate cancer .   Carlean Jews, NP

## 2020-06-08 ENCOUNTER — Telehealth: Payer: Self-pay | Admitting: Nurse Practitioner

## 2020-06-08 NOTE — Telephone Encounter (Signed)
Order faxed.

## 2020-06-08 NOTE — Telephone Encounter (Signed)
Patient needs the work order for his x-ray faxed over, patient is at the appt now. [phone], thanks.

## 2020-06-08 NOTE — Telephone Encounter (Signed)
Bringing order to [facility] to fax to requested location. AS, CMA

## 2020-06-09 ENCOUNTER — Ambulatory Visit
Admission: RE | Admit: 2020-06-09 | Discharge: 2020-06-09 | Disposition: A | Discharge status: Home / Self Care | Payer: 59 | Source: Ambulatory Visit | Attending: Nurse Practitioner | Admitting: Nurse Practitioner

## 2020-06-09 ENCOUNTER — Other Ambulatory Visit: Payer: 59

## 2020-06-09 ENCOUNTER — Other Ambulatory Visit: Payer: Self-pay

## 2020-06-09 DIAGNOSIS — M5442 Lumbago with sciatica, left side: Secondary | ICD-10-CM

## 2020-06-10 ENCOUNTER — Other Ambulatory Visit: Payer: 59

## 2020-06-11 NOTE — Progress Notes (Signed)
Multilevel degenerative disc disease and scoliosis noted. Discuss with patient at visit 06/16/2020

## 2020-06-15 ENCOUNTER — Other Ambulatory Visit: Payer: Self-pay

## 2020-06-15 ENCOUNTER — Other Ambulatory Visit: Payer: 59

## 2020-06-15 ENCOUNTER — Other Ambulatory Visit: Payer: Self-pay | Admitting: Nurse Practitioner

## 2020-06-15 DIAGNOSIS — I10 Essential (primary) hypertension: Secondary | ICD-10-CM

## 2020-06-15 DIAGNOSIS — Z125 Encounter for screening for malignant neoplasm of prostate: Secondary | ICD-10-CM

## 2020-06-15 DIAGNOSIS — Z0000 Encounter for general adult medical examination without abnormal findings: Secondary | ICD-10-CM

## 2020-06-16 ENCOUNTER — Ambulatory Visit (INDEPENDENT_AMBULATORY_CARE_PROVIDER_SITE_OTHER): Payer: 59 | Admitting: Nurse Practitioner

## 2020-06-16 ENCOUNTER — Encounter: Payer: Self-pay | Admitting: Nurse Practitioner

## 2020-06-16 VITALS — BP 118/76 | HR 83 | Temp 99.1°F | Ht 68.0 in | Wt 316.8 lb

## 2020-06-16 DIAGNOSIS — M5137 Other intervertebral disc degeneration, lumbosacral region: Secondary | ICD-10-CM

## 2020-06-16 DIAGNOSIS — Z0001 Encounter for general adult medical examination with abnormal findings: Secondary | ICD-10-CM

## 2020-06-16 DIAGNOSIS — R229 Localized swelling, mass and lump, unspecified: Secondary | ICD-10-CM | POA: Diagnosis not present

## 2020-06-16 DIAGNOSIS — E7800 Pure hypercholesterolemia, unspecified: Secondary | ICD-10-CM

## 2020-06-16 DIAGNOSIS — M5442 Lumbago with sciatica, left side: Secondary | ICD-10-CM | POA: Diagnosis not present

## 2020-06-16 DIAGNOSIS — R7303 Prediabetes: Secondary | ICD-10-CM

## 2020-06-16 DIAGNOSIS — M51379 Other intervertebral disc degeneration, lumbosacral region without mention of lumbar back pain or lower extremity pain: Secondary | ICD-10-CM

## 2020-06-16 LAB — COMPREHENSIVE METABOLIC PANEL
ALT: 21 IU/L (ref 0–44)
AST: 20 IU/L (ref 0–40)
Albumin/Globulin Ratio: 1.3 (ref 1.2–2.2)
Albumin: 4.1 g/dL (ref 3.8–4.9)
Alkaline Phosphatase: 89 IU/L (ref 44–121)
BUN/Creatinine Ratio: 12 (ref 9–20)
BUN: 13 mg/dL (ref 6–24)
Bilirubin Total: 0.8 mg/dL (ref 0.0–1.2)
CO2: 21 mmol/L (ref 20–29)
Calcium: 9 mg/dL (ref 8.7–10.2)
Chloride: 103 mmol/L (ref 96–106)
Creatinine, Ser: 1.09 mg/dL (ref 0.76–1.27)
Globulin, Total: 3.2 g/dL (ref 1.5–4.5)
Glucose: 102 mg/dL — ABNORMAL HIGH (ref 65–99)
Potassium: 4.4 mmol/L (ref 3.5–5.2)
Sodium: 139 mmol/L (ref 134–144)
Total Protein: 7.3 g/dL (ref 6.0–8.5)
eGFR: 79 mL/min/{1.73_m2} (ref >59)

## 2020-06-16 LAB — LIPID PANEL
Chol/HDL Ratio: 3.5 ratio (ref 0.0–5.0)
Cholesterol, Total: 233 mg/dL — ABNORMAL HIGH (ref 100–199)
HDL: 67 mg/dL (ref >39)
LDL Chol Calc (NIH): 140 mg/dL — ABNORMAL HIGH (ref 0–99)
Triglycerides: 147 mg/dL (ref 0–149)
VLDL Cholesterol Cal: 26 mg/dL (ref 5–40)

## 2020-06-16 LAB — PSA: Prostate Specific Ag, Serum: 3.8 ng/mL (ref 0.0–4.0)

## 2020-06-16 LAB — CBC
Hematocrit: 46 % (ref 37.5–51.0)
Hemoglobin: 15.5 g/dL (ref 13.0–17.7)
MCH: 33 pg (ref 26.6–33.0)
MCHC: 33.7 g/dL (ref 31.5–35.7)
MCV: 98 fL — ABNORMAL HIGH (ref 79–97)
Platelets: 238 10*3/uL (ref 150–450)
RBC: 4.7 x10E6/uL (ref 4.14–5.80)
RDW: 11.9 % (ref 11.6–15.4)
WBC: 4.9 10*3/uL (ref 3.4–10.8)

## 2020-06-16 LAB — HEMOGLOBIN A1C
Est. average glucose Bld gHb Est-mCnc: 117 mg/dL
Hgb A1c MFr Bld: 5.7 % — ABNORMAL HIGH (ref 4.8–5.6)

## 2020-06-16 LAB — TSH: TSH: 1.16 u[IU]/mL (ref 0.450–4.500)

## 2020-06-16 MED ORDER — PREDNISONE 20 MG PO TABS: 40.0000 mg | ORAL_TABLET | Freq: Every day | ORAL | 0 refills | Status: DC

## 2020-06-16 MED ORDER — GABAPENTIN 100 MG PO CAPS: 100.0000 mg | ORAL_CAPSULE | Freq: Two times a day (BID) | ORAL | 2 refills | Status: DC

## 2020-06-16 NOTE — Progress Notes (Signed)
Established Patient Office Visit  Subjective:  Patient ID: Brandon Snow, male    DOB: 1960-11-05  Age: 60 y.o. MRN: [id number]  CC:  Chief Complaint  Patient presents with  . Annual Exam    HPI Brandon Snow presents for annual wellness visit. Today, he c/o low back pain which radiates into to left hip and left lower leg. He c/o this at initial visit. Did prednisone taper which he states helped. Renewed gabapentin 121m at bedtime. He states that he only took once, so he is not sre if it really helped or not. Id did have an x-ray of his lmbar spine. This did show degenerative disc disease of the lumbar spine at multiple levels along with mild right scoliosis.  States that he has noted a lump at the lower left part of the abdomen, just above the waistline of his pants. State sthat it is not tender. He has noted this for at least several months. States that it has become a little larger, but does not bother him day today.  Had routine, fasting labs done since his last visit. His lipid panel was mildly elevated with a normal CHOL.HDL ratio.  Lipid Panel     Component Value Date/Time   CHOL 233 (H) 06/15/2020 1041   TRIG 147 06/15/2020 1041   HDL 67 06/15/2020 1041   CHOLHDL 3.5 06/15/2020 1041   LDLCALC 140 (H) 06/15/2020 1041   LABVLDL 26 06/15/2020 1041   Labs also indicate a mildly increased risk for developing diabetes in the future. His glucose was 102 and the HgbA1c is 5.7.  He denies other concerns or complaints today. He denies chest pain, chest pressure, or shortness of breath. He denies headaches or visual disturbances. He denies abdominal pain, nausea, vomiting, or changes in bowel or bladder habits.    Past Medical History:  Diagnosis Date  . COVID-19 2021  . Hypertension     History reviewed. No pertinent surgical history.  Family History  Problem Relation Age of Onset  . Diabetes Mother   . Hyperlipidemia Mother   . Hyperlipidemia Father   . Hypertension  Father   . Hypertension Sister   . Hyperlipidemia Sister   . Diabetes Sister   . Diabetes Maternal Grandfather   . Diabetes Paternal Grandmother   . Hyperlipidemia Paternal Grandmother   . Hypertension Paternal Grandmother   . Sudden death Paternal Grandmother   . Heart attack Neg Hx     Social History   Socioeconomic History  . Marital status: Single    Spouse name: Not on file  . Number of children: Not on file  . Years of education: Not on file  . Highest education level: Not on file  Occupational History  . Not on file  Tobacco Use  . Smoking status: Current Some Day Smoker    Packs/day: 0.50    Types: Cigarettes  . Smokeless tobacco: Never Used  Vaping Use  . Vaping Use: Never used  Substance and Sexual Activity  . Alcohol use: Yes  . Drug use: No  . Sexual activity: Yes    Partners: Female  Other Topics Concern  . Not on file  Social History Narrative  . Not on file   Social Determinants of Health   Financial Resource Strain: Not on file  Food Insecurity: Not on file  Transportation Needs: Not on file  Physical Activity: Not on file  Stress: Not on file  Social Connections: Not on file  Intimate Partner  Violence: Not on file    Outpatient Medications Prior to Visit  Medication Sig Dispense Refill  . Ascorbic Acid (VITAMIN C) 1000 MG tablet Take 1,000 mg by mouth daily.    . cyclobenzaprine (FLEXERIL) 10 MG tablet Take 1 tablet (10 mg total) by mouth 3 (three) times daily as needed for muscle spasms. 15 tablet 0  . escitalopram (LEXAPRO) 10 MG tablet TAKE 1 TABLET BY MOUTH EVERY DAY 30 tablet 0  . hydrOXYzine (ATARAX/VISTARIL) 10 MG tablet Take 1 tablet (10 mg total) by mouth 3 (three) times daily as needed. 30 tablet 0  . metoprolol tartrate (LOPRESSOR) 50 MG tablet Take 1 tablet (50 mg total) by mouth 2 (two) times daily. 60 tablet 0  . Multiple Vitamin (MULTIVITAMIN) tablet Take 1 tablet by mouth daily.    Marland Kitchen tetrahydrozoline-zinc (VISINE-AC) 0.05-0.25  % ophthalmic solution Place 1 drop into both eyes 3 (three) times daily as needed (dry eyes).    Marland Kitchen albuterol (VENTOLIN HFA) 108 (90 Base) MCG/ACT inhaler Inhale 2 puffs into the lungs every 6 (six) hours as needed for wheezing or shortness of breath. 6.7 g 0  . benzonatate (TESSALON) 100 MG capsule Take 2 capsules (200 mg total) by mouth 2 (two) times daily as needed for cough. 20 capsule 0  . gabapentin (NEURONTIN) 100 MG capsule Take 1 capsule (100 mg total) by mouth at bedtime. 30 capsule 1  . predniSONE (DELTASONE) 20 MG tablet Take 2 tablets (40 mg total) by mouth daily with breakfast. 10 tablet 0  . apixaban (ELIQUIS) 2.5 MG TABS tablet Take 1 tablet (2.5 mg total) by mouth 2 (two) times daily for 14 days. 28 tablet 0   No facility-administered medications prior to visit.    No Known Allergies  ROS Review of Systems  Constitutional: Positive for fatigue. Negative for activity change, chills and fever.  HENT: Negative for congestion, rhinorrhea, sinus pressure, sinus pain and sneezing.   Eyes: Negative.   Respiratory: Negative for cough, chest tightness and wheezing.   Cardiovascular: Negative for chest pain and palpitations.  Gastrointestinal: Negative for constipation, diarrhea and nausea.  Endocrine: Negative for cold intolerance, heat intolerance, polydipsia and polyuria.  Genitourinary: Negative.   Musculoskeletal: Positive for back pain. Negative for myalgias.       Low back pain which radiates into the left hip and left leg.   Skin: Negative for rash.  Allergic/Immunologic: Negative.   Neurological: Negative for dizziness, weakness and headaches.  Psychiatric/Behavioral: Negative for dysphoric mood. The patient is not nervous/anxious.   All other systems reviewed and are negative.     Objective:    Physical Exam Vitals and nursing note reviewed.  Constitutional:      Appearance: Normal appearance. He is well-developed.  HENT:     Head: Normocephalic and atraumatic.      Right Ear: Ear canal and external ear normal.     Left Ear: Ear canal and external ear normal.     Nose: Nose normal.     Mouth/Throat:     Mouth: Mucous membranes are moist.     Pharynx: Oropharynx is clear.  Eyes:     Conjunctiva/sclera: Conjunctivae normal.     Pupils: Pupils are equal, round, and reactive to light.  Cardiovascular:     Rate and Rhythm: Normal rate and regular rhythm.  Pulmonary:     Effort: Pulmonary effort is normal.     Breath sounds: Normal breath sounds.  Abdominal:     General: Bowel sounds are  normal.     Palpations: Abdomen is soft.     Tenderness: There is no abdominal tenderness.    Musculoskeletal:        General: Normal range of motion.     Cervical back: Normal range of motion and neck supple.  Skin:    General: Skin is warm and dry.     Capillary Refill: Capillary refill takes less than 2 seconds.  Neurological:     General: No focal deficit present.     Mental Status: He is alert and oriented to person, place, and time.  Psychiatric:        Mood and Affect: Mood normal.        Behavior: Behavior normal.        Thought Content: Thought content normal.        Judgment: Judgment normal.     Today's Vitals   06/16/20 1026  BP: 118/76  Pulse: 83  Temp: 99.1 F (37.3 C)  SpO2: 93%  Weight: (!) 316 lb 12.8 oz (143.7 kg)  Height: _0  (1.727 m)   Body mass index is 48.17 kg/m.   Wt Readings from Last 3 Encounters:  06/16/20 (!) 316 lb 12.8 oz (143.7 kg)  05/25/20 (!) 316 lb 3.2 oz (143.4 kg)  12/30/19 299 lb 0.1 oz (135.6 kg)     Health Maintenance Due  Topic Date Due  . COVID-19 Vaccine (1) Never done  . Zoster Vaccines- Shingrix (1 of 2) Never done    There are no preventive care reminders to display for this patient.  Lab Results  Component Value Date   TSH 1.160 06/15/2020   Lab Results  Component Value Date   WBC 4.9 06/15/2020   HGB 15.5 06/15/2020   HCT 46.0 06/15/2020   MCV 98 (H) 06/15/2020   PLT 238  06/15/2020   Lab Results  Component Value Date   NA 139 06/15/2020   K 4.4 06/15/2020   CO2 21 06/15/2020   GLUCOSE 102 (H) 06/15/2020   BUN 13 06/15/2020   CREATININE 1.09 06/15/2020   BILITOT 0.8 06/15/2020   ALKPHOS 89 06/15/2020   AST 20 06/15/2020   ALT 21 06/15/2020   PROT 7.3 06/15/2020   ALBUMIN 4.1 06/15/2020   CALCIUM 9.0 06/15/2020   ANIONGAP 10 11/13/2019   EGFR 79 06/15/2020   Lab Results  Component Value Date   CHOL 233 (H) 06/15/2020   Lab Results  Component Value Date   HDL 67 06/15/2020   Lab Results  Component Value Date   LDLCALC 140 (H) 06/15/2020   Lab Results  Component Value Date   TRIG 147 06/15/2020   Lab Results  Component Value Date   CHOLHDL 3.5 06/15/2020   Lab Results  Component Value Date   HGBA1C 5.7 (H) 06/15/2020      Assessment & Plan:  1. Encounter for general adult medical examination with abnormal findings Annual health maintenance exam   2. Acute left-sided low back pain with left-sided sciatica Reviewed x-ray showing multilevel degenerative disc disease. Will repeat prednisone taper. Take as directed for 6 days. Increase dosing of gabapentin to twice daily. Encouraged him to start taking at bedtime. Then start taking twice daily if tolerated well at night. Will refer to orthopedics for further evaluation and treatment.  - predniSONE (DELTASONE) 20 MG tablet; Take 2 tablets (40 mg total) by mouth daily with breakfast.  Dispense: 10 tablet; Refill: 0 - gabapentin (NEURONTIN) 100 MG capsule; Take 1 capsule (100 mg  total) by mouth 2 (two) times daily.  Dispense: 60 capsule; Refill: 2 - Ambulatory referral to Orthopedic Surgery  3. Degenerative disc disease at L5-S1 level X-ray of lumbar spine showing degenerative disc disease, most severe at L5/S1. Will refer to orthopedics for further evaluation and treatment. - Ambulatory referral to Orthopedic Surgery  4. Local superficial swelling, mass or lump Ultrasound of pelvis  ordered for further evaluation. Referral as indicated.   5. Elevated cholesterol Reviewed labs showing elevated total cholesterol and LDL. Discussed diet and lifestyle changes which can be made to help lower cholesterol without adding medication. The patient voiced understanding. Written information provided to support discussion.   6. Prediabetes Reviewed labs showing mildly elevated blood glucose and elevated HgbA1c. Discussed diet and lifestyle changes needed to help lower blood sugar without adding medication. The patient voiced understanding.   Problem List Items Addressed This Visit      Nervous and Auditory   Acute left-sided low back pain with left-sided sciatica   Relevant Medications   predniSONE (DELTASONE) 20 MG tablet   gabapentin (NEURONTIN) 100 MG capsule   Other Relevant Orders   Ambulatory referral to Orthopedic Surgery     Musculoskeletal and Integument   Degenerative disc disease at L5-S1 level   Relevant Medications   predniSONE (DELTASONE) 20 MG tablet   Other Relevant Orders   Ambulatory referral to Orthopedic Surgery     Other   Encounter for general adult medical examination with abnormal findings - Primary   Local superficial swelling, mass or lump   Elevated cholesterol   Prediabetes      Meds ordered this encounter  Medications  . predniSONE (DELTASONE) 20 MG tablet    Sig: Take 2 tablets (40 mg total) by mouth daily with breakfast.    Dispense:  10 tablet    Refill:  0    Order Specific Question:   Supervising Provider    Answer:   Beatrice Lecher D [[id number]]  . gabapentin (NEURONTIN) 100 MG capsule    Sig: Take 1 capsule (100 mg total) by mouth 2 (two) times daily.    Dispense:  60 capsule    Refill:  2    Order Specific Question:   Supervising Provider    Answer:   Beatrice Lecher D [[id number]]    Follow-up: Return in about 6 weeks (around 07/28/2020) for increased gabapentin - review ultrasound results. Ronnell Freshwater, NP

## 2020-06-16 NOTE — Patient Instructions (Signed)
Degenerative Disk Disease  Degenerative disk disease is a condition caused by changes that occur in the spinal disks as a person ages. Spinal disks are soft and compressible disks located between the bones of your spine (vertebrae). These disks act like shock absorbers. Degenerative disk disease can affect the whole spine. However, the neck and lower back are most often affected. Many changes can occur in the spinal disks with aging, such as:  The spinal disks may dry and shrink.  Small tears may occur in the tough, outer covering of the disk (annulus).  The disk space may become smaller due to loss of water.  Abnormal growths in the bone (spurs) may occur. This can put pressure on the nerve roots exiting the spinal canal, causing pain.  The spinal canal may become narrowed. What are the causes? This condition may be caused by:  Normal degeneration with age.  Injuries.  Certain activities and sports that cause damage. What increases the risk? The following factors may make you more likely to develop this condition:  Being overweight.  Having a family history of degenerative disk disease.  Smoking and use of products that contain nicotine and tobacco.  Sudden injury.  Doing work that requires heavy lifting. What are the signs or symptoms? Symptoms of this condition include:  Pain that varies in intensity. Some people have no pain, while others have severe pain. The location of the pain depends on the part of your backbone that is affected. You may have: ? Pain in your neck or arm if a disk in your neck area is affected. ? Pain in your back, buttocks, or legs if a disk in your lower back is affected.  Pain that becomes worse while bending or reaching up, or with twisting movements.  Pain that may start gradually and worsen as time passes. It may also start after a major or minor injury.  Numbness or tingling in the arms or legs. How is this diagnosed? This condition may  be diagnosed based on:  Your symptoms and medical history.  A physical exam.  Imaging tests, including: ? X-ray of the spine. ? CT scan. ? MRI. How is this treated? This condition may be treated with:  Medicines.  Injection of steroids into the back.  Rehabilitation exercises. These activities aim to strengthen muscles in your back and abdomen to better support your spine. If treatments do not help to relieve your symptoms or you have severe pain, you may need surgery. Follow these instructions at home: Medicines  Take over-the-counter and prescription medicines only as told by your health care provider.  Ask your health care provider if the medicine prescribed to you: ? Requires you to avoid driving or using machinery. ? Can cause constipation. You may need to take these actions to prevent or treat constipation:  Drink enough fluid to keep your urine pale yellow.  Take over-the-counter or prescription medicines.  Eat foods that are high in fiber, such as beans, whole grains, and fresh fruits and vegetables.  Limit foods that are high in fat and processed sugars, such as fried or sweet foods. Activity  Rest as told by your health care provider.  Avoid sitting for a long time without moving. Get up to take short walks every 1-2 hours. This is important to improve blood flow and breathing. Ask for help if you feel weak or unsteady.  Return to your normal activities as told by your health care provider. Ask your health care provider what activities   are safe for you.  Perform relaxation exercises as told by your health care provider.  Maintain good posture.  Do not lift anything that is heavier than 10 lb (4.5 kg), or the limit that you are told, until your health care provider says that it is safe.  Follow proper lifting and walking techniques as told by your health care provider. Managing pain, stiffness, and swelling  If directed, put ice on the painful area. Icing  can help to relieve pain. To do this: ? Put ice in a plastic bag. ? Place a towel between your skin and the bag. ? Leave the ice on for 20 minutes, 2-3 times a day. ? Remove the ice if your skin turns bright red. This is very important. If you cannot feel pain, heat, or cold, you have a greater risk of damage to the area.  If directed, apply heat to the painful area as often as told by your health care provider. Heat can reduce the stiffness of your muscles. Use the heat source that your health care provider recommends, such as a moist heat pack or a heating pad. ? Place a towel between your skin and the heat source. ? Leave the heat on for 20-30 minutes. ? Remove the heat if your skin turns bright red. This is especially important if you are unable to feel pain, heat, or cold. You may have a greater risk of getting burned.      General instructions  Change your sitting, standing, and sleeping habits as told by your health care provider.  Avoid sitting in the same position for long periods of time. Change positions frequently.  Lose weight or maintain a healthy weight as told by your health care provider.  Do not use any products that contain nicotine or tobacco, such as cigarettes, e-cigarettes, and chewing tobacco. If you need help quitting, ask your health care provider.  Wear supportive footwear.  Keep all follow-up visits. This is important. This may include visits for physical therapy. Contact a health care provider if you:  Have pain that does not go away within 1-4 weeks.  Lose your appetite.  Lose weight without trying. Get help right away if you:  Have severe pain.  Notice weakness in your arms, hands, or legs.  Begin to lose control of your bladder or bowel movements.  Have fevers or night sweats. Summary  Degenerative disk disease is a condition caused by changes that occur in the spinal disks as a person ages.  This condition can affect the whole spine.  However, the neck and lower back are most often affected.  Take over-the-counter and prescription medicines only as told by your health care provider. This information is not intended to replace advice given to you by your health care provider. Make sure you discuss any questions you have with your health care provider. Document Revised: 05/02/2019 Document Reviewed: 05/02/2019 Elsevier Patient Education  2021 Elsevier Inc.  

## 2020-06-17 ENCOUNTER — Telehealth: Payer: Self-pay | Admitting: Nurse Practitioner

## 2020-06-17 DIAGNOSIS — R229 Localized swelling, mass and lump, unspecified: Secondary | ICD-10-CM

## 2020-06-17 NOTE — Telephone Encounter (Signed)
Spoke with April and per [facility] gave verbal auth for pelvis US soft tissue. AS, CMA

## 2020-06-17 NOTE — Telephone Encounter (Signed)
Hey. I didn't see option for US soft tissue abdomen. The closest to that was thorax/chest.

## 2020-06-17 NOTE — Telephone Encounter (Signed)
[facility] called about our patient's [place] Chest Tissue order that was put in yesterday. They pointed out that it sounds like it is more in the pelvic region according to the reason and notes for the exam. Please advise, thanks.

## 2020-06-17 NOTE — Addendum Note (Signed)
Addended by: Sylvester Harder on: 06/17/2020 04:01 PM   Modules accepted: Orders

## 2020-06-22 ENCOUNTER — Encounter: Payer: Self-pay | Admitting: Family Medicine

## 2020-06-22 ENCOUNTER — Other Ambulatory Visit: Payer: Self-pay

## 2020-06-22 ENCOUNTER — Ambulatory Visit (INDEPENDENT_AMBULATORY_CARE_PROVIDER_SITE_OTHER): Payer: 59 | Admitting: Family Medicine

## 2020-06-22 ENCOUNTER — Ambulatory Visit: Payer: 59 | Admitting: Family Medicine

## 2020-06-22 DIAGNOSIS — G8929 Other chronic pain: Secondary | ICD-10-CM | POA: Diagnosis not present

## 2020-06-22 DIAGNOSIS — M5442 Lumbago with sciatica, left side: Secondary | ICD-10-CM | POA: Diagnosis not present

## 2020-06-22 NOTE — Progress Notes (Signed)
Office Visit Note   Patient: Brandon Snow           Date of Birth: 12/12/1960           MRN: [id number] Visit Date: 06/22/2020 Requested by: Carlean Jews, NP [address] [place],  [place] [address] PCP: Carlean Jews, NP  Subjective: Chief Complaint  Patient presents with  . Lower Back - Pain    Pain in the left lower back and numbness down the left leg to the foot. Some pain in the leg also. Cannot walk far without having to stop and sit down, due to the pain. This pain started about January of this year. He said he had just gotten out of the hospital (after 13 days) for covid and had gained a lot of weight. He feels like this has contributed to the back pain.    HPI: He is here with low back and left leg pain.  He has had troubles for several years, but the pain was always manageable until a couple months ago.  He was sick and hospitalized for a little while and gained some weight, he thinks this increase his pain.  Pain travels down the leg into the toes and gives him a numbness and tingling sensation.  He denies any weakness, denies any bowel or bladder dysfunction.  Pain is better when up and moving around and worse when sitting or lying down.  Gabapentin helps a little bit makes him sleepy.  Prednisone helped temporarily.               ROS:   All other systems were reviewed and are negative.  Objective: Vital Signs: There were no vitals taken for this visit.  Physical Exam:  General:  Alert and oriented, in no acute distress. Pulm:  Breathing unlabored. Psy:  Normal mood, congruent affect.  Low back: He has tenderness near the left SI joint and in the sciatic notch on the left.  He has 5/5 lower extremity strength bilaterally.  Piriformis test equivocal.  Imaging: X-Rays show L4-5 DDD with sacralization of L5 vertebra    Assessment & Plan: 1.  Low back pain with left-sided sciatica, suspicious for lumbar disc protrusion. -We discussed options and  he would like to try an epidural injection followed by physical therapy.  MRI scan if he fails to improve.     Procedures: No procedures performed        PMFS History: Patient Active Problem List   Diagnosis Date Noted  . Encounter to establish care 05/25/2020  . Acute left-sided low back pain with left-sided sciatica 05/25/2020  . History of COVID-19 05/25/2020  . Anxiety 12/31/2019  . Generalized anxiety disorder 11/26/2019  . Shortness of breath 11/26/2019  . Physical deconditioning 11/18/2019  . Sepsis (HCC) 10/31/2019  . Acute respiratory failure with hypoxemia (HCC) 10/31/2019  . Elevated transaminase level 10/31/2019  . Essential hypertension 10/31/2019  . Pneumonia due to COVID-19 virus 10/30/2019  . Right knee pain 11/26/2010   Past Medical History:  Diagnosis Date  . COVID-19 2021  . Hypertension     Family History  Problem Relation Age of Onset  . Diabetes Mother   . Hyperlipidemia Mother   . Hyperlipidemia Father   . Hypertension Father   . Hypertension Sister   . Hyperlipidemia Sister   . Diabetes Sister   . Diabetes Maternal Grandfather   . Diabetes Paternal Grandmother   . Hyperlipidemia Paternal Grandmother   .  Hypertension Paternal Grandmother   . Sudden death Paternal Grandmother   . Heart attack Neg Hx     History reviewed. No pertinent surgical history. Social History   Occupational History  . Not on file  Tobacco Use  . Smoking status: Current Some Day Smoker    Packs/day: 0.50    Types: Cigarettes  . Smokeless tobacco: Never Used  Vaping Use  . Vaping Use: Never used  Substance and Sexual Activity  . Alcohol use: Yes  . Drug use: No  . Sexual activity: Yes    Partners: Female

## 2020-06-24 ENCOUNTER — Other Ambulatory Visit: Payer: 59

## 2020-06-29 DIAGNOSIS — M5137 Other intervertebral disc degeneration, lumbosacral region: Secondary | ICD-10-CM | POA: Insufficient documentation

## 2020-06-29 DIAGNOSIS — E7800 Pure hypercholesterolemia, unspecified: Secondary | ICD-10-CM | POA: Insufficient documentation

## 2020-06-29 DIAGNOSIS — Z0001 Encounter for general adult medical examination with abnormal findings: Secondary | ICD-10-CM | POA: Insufficient documentation

## 2020-06-29 DIAGNOSIS — R229 Localized swelling, mass and lump, unspecified: Secondary | ICD-10-CM | POA: Insufficient documentation

## 2020-06-29 DIAGNOSIS — R7303 Prediabetes: Secondary | ICD-10-CM | POA: Insufficient documentation

## 2020-06-29 DIAGNOSIS — E785 Hyperlipidemia, unspecified: Secondary | ICD-10-CM | POA: Insufficient documentation

## 2020-06-30 ENCOUNTER — Encounter: Payer: Self-pay | Admitting: Family Medicine

## 2020-06-30 ENCOUNTER — Other Ambulatory Visit: Payer: Self-pay | Admitting: Family Medicine

## 2020-06-30 DIAGNOSIS — M5442 Lumbago with sciatica, left side: Secondary | ICD-10-CM

## 2020-06-30 DIAGNOSIS — G8929 Other chronic pain: Secondary | ICD-10-CM

## 2020-06-30 NOTE — Progress Notes (Signed)
Insurance won't cover injections until you've tried physical therapy first, so I will request referral.

## 2020-07-13 ENCOUNTER — Ambulatory Visit: Payer: 59 | Admitting: Physical Therapy

## 2020-07-29 ENCOUNTER — Ambulatory Visit: Payer: 59 | Admitting: Nurse Practitioner

## 2020-12-13 ENCOUNTER — Ambulatory Visit (HOSPITAL_COMMUNITY)
Admission: EM | Admit: 2020-12-13 | Discharge: 2020-12-13 | Disposition: A | Discharge status: Home / Self Care | Payer: 59 | Attending: Medical Oncology | Admitting: Medical Oncology

## 2020-12-13 ENCOUNTER — Encounter (HOSPITAL_COMMUNITY): Payer: Self-pay

## 2020-12-13 DIAGNOSIS — A64 Unspecified sexually transmitted disease: Secondary | ICD-10-CM | POA: Diagnosis not present

## 2020-12-13 DIAGNOSIS — R229 Localized swelling, mass and lump, unspecified: Secondary | ICD-10-CM

## 2020-12-13 DIAGNOSIS — M25522 Pain in left elbow: Secondary | ICD-10-CM | POA: Diagnosis not present

## 2020-12-13 MED ORDER — METRONIDAZOLE 500 MG PO TABS: 500.0000 mg | ORAL_TABLET | Freq: Two times a day (BID) | ORAL | 0 refills | Status: DC

## 2020-12-13 MED ORDER — NAPROXEN 500 MG PO TABS: 500.0000 mg | ORAL_TABLET | Freq: Two times a day (BID) | ORAL | 0 refills | Status: DC

## 2020-12-13 MED ORDER — CYCLOBENZAPRINE HCL 10 MG PO TABS: 10.0000 mg | ORAL_TABLET | Freq: Three times a day (TID) | ORAL | 0 refills | Status: DC | PRN

## 2020-12-13 NOTE — ED Triage Notes (Signed)
Pt presents with left side elbow pain X 3 days with no known injury.   Pt states his partner recently tested positive for Trich.

## 2020-12-13 NOTE — Discharge Instructions (Addendum)
Please take the Naproxen with food and should you have any blood, red, or coffee ground like vomiting or stools please stop this medication ans seek immediate medical attention

## 2020-12-13 NOTE — ED Provider Notes (Signed)
[facility]    CSN: [id number] Arrival date & time: 12/13/20  1327      History   Chief Complaint Chief Complaint  Patient presents with   Elbow Pain    HPI Brandon Snow is a 60 y.o. male.   HPI  Elbow Pain: Pt reports that he has had left elbow pain for the past 3 days without known injury. He does reports using a car buffer which may have strained this area. He has not tried anything for symptoms. No numbness, tingling, fever. No history of gout.   Pt reports that his sexual partner alerted him that she tested positive for trich. He denies fevers, penile discharge or testicular pain. He declines testing but wishes to be treated.   Past Medical History:  Diagnosis Date   COVID-19 2021   Hypertension     Patient Active Problem List   Diagnosis Date Noted   Encounter for general adult medical examination with abnormal findings 06/29/2020   Degenerative disc disease at L5-S1 level 06/29/2020   Local superficial swelling, mass or lump 06/29/2020   Elevated cholesterol 06/29/2020   Prediabetes 06/29/2020   Encounter to establish care 05/25/2020   Acute left-sided low back pain with left-sided sciatica 05/25/2020   History of COVID-19 05/25/2020   Anxiety 12/31/2019   Generalized anxiety disorder 11/26/2019   Shortness of breath 11/26/2019   Physical deconditioning 11/18/2019   Sepsis (HCC) 10/31/2019   Acute respiratory failure with hypoxemia (HCC) 10/31/2019   Elevated transaminase level 10/31/2019   Essential hypertension 10/31/2019   Pneumonia due to COVID-19 virus 10/30/2019   Right knee pain 11/26/2010    History reviewed. No pertinent surgical history.     Home Medications    Prior to Admission medications   Medication Sig Start Date End Date Taking? Authorizing Provider  Ascorbic Acid (VITAMIN C) 1000 MG tablet Take 1,000 mg by mouth daily.    [provider]  cyclobenzaprine (FLEXERIL) 10 MG tablet Take 1 tablet (10 mg total)  by mouth 3 (three) times daily as needed for muscle spasms. 05/10/20   Particia Nearing, PA-C  escitalopram (LEXAPRO) 10 MG tablet TAKE 1 TABLET BY MOUTH EVERY DAY 01/15/20   Ivonne Andrew, NP  gabapentin (NEURONTIN) 100 MG capsule Take 1 capsule (100 mg total) by mouth 2 (two) times daily. 06/16/20   Carlean Jews, NP  hydrOXYzine (ATARAX/VISTARIL) 10 MG tablet Take 1 tablet (10 mg total) by mouth 3 (three) times daily as needed. 11/27/19   Ivonne Andrew, NP  metoprolol tartrate (LOPRESSOR) 50 MG tablet Take 1 tablet (50 mg total) by mouth 2 (two) times daily. 11/14/19   Leroy Sea, MD  Multiple Vitamin (MULTIVITAMIN) tablet Take 1 tablet by mouth daily.    [provider]  predniSONE (DELTASONE) 20 MG tablet Take 2 tablets (40 mg total) by mouth daily with breakfast. 06/16/20   Carlean Jews, NP  tetrahydrozoline-zinc (VISINE-AC) 0.05-0.25 % ophthalmic solution Place 1 drop into both eyes 3 (three) times daily as needed (dry eyes).    [provider]    Family History Family History  Problem Relation Age of Onset   Diabetes Mother    Hyperlipidemia Mother    Hyperlipidemia Father    Hypertension Father    Hypertension Sister    Hyperlipidemia Sister    Diabetes Sister    Diabetes Maternal Grandfather    Diabetes Paternal Grandmother    Hyperlipidemia Paternal Grandmother    Hypertension  Paternal Grandmother    Sudden death Paternal Grandmother    Heart attack Neg Hx     Social History Social History   Tobacco Use   Smoking status: Some Days    Packs/day: 0.50    Types: Cigarettes   Smokeless tobacco: Never  Vaping Use   Vaping Use: Never used  Substance Use Topics   Alcohol use: Yes   Drug use: No     Allergies   Patient has no known allergies.   Review of Systems Review of Systems  As stated above in hPI Physical Exam Triage Vital Signs ED Triage Vitals  Enc Vitals Group     BP 12/13/20 1509 (!) 139/93     Pulse  Rate 12/13/20 1509 63     Resp --      Temp 12/13/20 1509 98.6 F (37 C)     Temp Source 12/13/20 1509 Oral     SpO2 12/13/20 1509 100 %     Weight --      Height --      Head Circumference --      Peak Flow --      Pain Score 12/13/20 1515 6     Pain Loc --      Pain Edu? --      Excl. in [place]? --    No data found.  Updated Vital Signs BP (!) 139/93 (BP Location: Left Arm)   Pulse 63   Temp 98.6 F (37 C) (Oral)   SpO2 100%   Physical Exam Vitals and nursing note reviewed.  Constitutional:      General: He is not in acute distress.    Appearance: Normal appearance. He is not ill-appearing, toxic-appearing or diaphoretic.  Cardiovascular:     Rate and Rhythm: Normal rate and regular rhythm.     Pulses: Normal pulses.     Heart sounds: Normal heart sounds.  Pulmonary:     Effort: Pulmonary effort is normal.     Breath sounds: Normal breath sounds.  Musculoskeletal:        General: Tenderness (mild tenderness with palpable muscle tension of the left distal upper arm. Palpable muscle notting with more superficial non-mobile lesions.) present. Normal range of motion.  Skin:    Capillary Refill: Capillary refill takes less than 2 seconds.  Neurological:     General: No focal deficit present.     Mental Status: He is alert and oriented to person, place, and time.     UC Treatments / Results  Labs (all labs ordered are listed, but only abnormal results are displayed) Labs Reviewed - No data to display  EKG   Radiology No results found.  Procedures Procedures (including critical care time)  Medications Ordered in UC Medications - No data to display  Initial Impression / Assessment and Plan / UC Course  I have reviewed the triage vital signs and the nursing notes.  Pertinent labs & imaging results that were available during my care of the patient were reviewed by me and considered in my medical decision making (see chart for details).     New.  Appears to be  musculoskeletal in nature.  I did discuss with patient that I do want him to have assessment for the more superficial nonmobile masses and I recommended orthopedics versus dermatology.  In the meantime I am going to treat with a muscle relaxer and naproxen to help with his symptoms.  I am also going to treat him for trichomonas given  his exposure.  Discussed safe sex practices.  Discussed red flag signs and symptoms.  Follow-up PRN.  Final Clinical Impressions(s) / UC Diagnoses   Final diagnoses:  None   Discharge Instructions   None    ED Prescriptions   None    PDMP not reviewed this encounter.   Hughie Closs, [place] 12/13/20 1540

## 2021-06-16 ENCOUNTER — Other Ambulatory Visit: Payer: Self-pay

## 2021-06-16 ENCOUNTER — Emergency Department (HOSPITAL_BASED_OUTPATIENT_CLINIC_OR_DEPARTMENT_OTHER)
Admission: EM | Admit: 2021-06-16 | Discharge: 2021-06-16 | Disposition: A | Discharge status: Home / Self Care | Payer: 59 | Attending: Emergency Medicine | Admitting: Emergency Medicine

## 2021-06-16 ENCOUNTER — Encounter (HOSPITAL_BASED_OUTPATIENT_CLINIC_OR_DEPARTMENT_OTHER): Payer: Self-pay | Admitting: Emergency Medicine

## 2021-06-16 DIAGNOSIS — S40862A Insect bite (nonvenomous) of left upper arm, initial encounter: Secondary | ICD-10-CM | POA: Diagnosis not present

## 2021-06-16 DIAGNOSIS — S4991XA Unspecified injury of right shoulder and upper arm, initial encounter: Secondary | ICD-10-CM | POA: Diagnosis present

## 2021-06-16 DIAGNOSIS — T4995XA Adverse effect of unspecified topical agent, initial encounter: Secondary | ICD-10-CM | POA: Diagnosis not present

## 2021-06-16 DIAGNOSIS — S80861A Insect bite (nonvenomous), right lower leg, initial encounter: Secondary | ICD-10-CM | POA: Diagnosis not present

## 2021-06-16 DIAGNOSIS — S40861A Insect bite (nonvenomous) of right upper arm, initial encounter: Secondary | ICD-10-CM | POA: Insufficient documentation

## 2021-06-16 DIAGNOSIS — W57XXXA Bitten or stung by nonvenomous insect and other nonvenomous arthropods, initial encounter: Secondary | ICD-10-CM | POA: Insufficient documentation

## 2021-06-16 DIAGNOSIS — T7840XA Allergy, unspecified, initial encounter: Secondary | ICD-10-CM

## 2021-06-16 DIAGNOSIS — S80862A Insect bite (nonvenomous), left lower leg, initial encounter: Secondary | ICD-10-CM | POA: Insufficient documentation

## 2021-06-16 MED ORDER — DEXAMETHASONE 4 MG PO TABS
6.0000 mg | ORAL_TABLET | Freq: Once | ORAL | Status: AC
Start: 2021-06-16 — End: 2021-06-16
  Administered 2021-06-16: 6 mg via ORAL
  Filled 2021-06-16: qty 2

## 2021-06-16 NOTE — ED Triage Notes (Addendum)
Pt via pov from home with multiple bites from red ants. Pt reports that he was standing outside and he had ants all over his ankles. Pt reports he put calamine lotion on his arms and took 2 benadryl tablets about 2 hours ago, as he was feeling swollen in his mouth and around his lips. and like he had hives. Pt alert & oriented, nad noted.  ?

## 2021-06-16 NOTE — Discharge Instructions (Signed)
Continue to use the calamine and Benadryl as needed for the itching.  Return to emergency room if you have any swelling of your face or tongue, sore throat, shortness of breath, or other worsening symptoms. ?

## 2021-06-16 NOTE — ED Provider Notes (Signed)
?[facility] EM[facility]ENCY DEPARTMENT ?Provider Note ? ? ?CSN: [id number] ?Arrival date & time: 06/16/21  1528 ? ?  ? ?History ? ?Chief Complaint  ?Patient presents with  ? Insect Bite  ? ? ?Brandon Snow is a 61 y.o. male. ? ?Patient is a 61 year old male who presents with ant bites with a reaction to such.  He said he was outside and got bitten by multiple red ants to his legs and on his hands.  He said they were itching and burning.  Shortly after he got bitten he started having some swelling and tingling around his lips.  No swelling of his tongue or throat.  He took some Benadryl and that seems to have resolved.  He has put some calamine lotion on his arms and legs and says the burning feels better.  The bites happened about 2 hours ago.  He denies any prior history of allergic reactions.  No shortness of breath.  No systemic rash. ? ? ?  ? ?Home Medications ?Prior to Admission medications   ?Medication Sig Start Date End Date Taking? Authorizing Provider  ?Ascorbic Acid (VITAMIN C) 1000 MG tablet Take 1,000 mg by mouth daily.    [provider]  ?cyclobenzaprine (FLEXERIL) 10 MG tablet Take 1 tablet (10 mg total) by mouth 3 (three) times daily as needed for muscle spasms. 12/13/20   Rushie Chestnut, PA-C  ?escitalopram (LEXAPRO) 10 MG tablet TAKE 1 TABLET BY MOUTH EVERY DAY 01/15/20   Ivonne Andrew, NP  ?gabapentin (NEURONTIN) 100 MG capsule Take 1 capsule (100 mg total) by mouth 2 (two) times daily. 06/16/20   Carlean Jews, NP  ?hydrOXYzine (ATARAX/VISTARIL) 10 MG tablet Take 1 tablet (10 mg total) by mouth 3 (three) times daily as needed. 11/27/19   Ivonne Andrew, NP  ?metoprolol tartrate (LOPRESSOR) 50 MG tablet Take 1 tablet (50 mg total) by mouth 2 (two) times daily. 11/14/19   Leroy Sea, MD  ?metroNIDAZOLE (FLAGYL) 500 MG tablet Take 1 tablet (500 mg total) by mouth 2 (two) times daily. 12/13/20   Rushie Chestnut, PA-C  ?Multiple Vitamin (MULTIVITAMIN) tablet Take  1 tablet by mouth daily.    [provider]  ?naproxen (NAPROSYN) 500 MG tablet Take 1 tablet (500 mg total) by mouth 2 (two) times daily. 12/13/20   Rushie Chestnut, PA-C  ?tetrahydrozoline-zinc (VISINE-AC) 0.05-0.25 % ophthalmic solution Place 1 drop into both eyes 3 (three) times daily as needed (dry eyes).    [provider]  ?   ? ?Allergies    ?Patient has no known allergies.   ? ?Review of Systems   ?Review of Systems  ?Constitutional:  Negative for chills, diaphoresis, fatigue and fever.  ?HENT:  Positive for facial swelling. Negative for congestion, rhinorrhea and sneezing.   ?Eyes: Negative.   ?Respiratory:  Negative for cough, chest tightness and shortness of breath.   ?Cardiovascular:  Negative for chest pain and leg swelling.  ?Gastrointestinal:  Negative for abdominal pain, blood in stool, diarrhea, nausea and vomiting.  ?Genitourinary:  Negative for difficulty urinating, flank pain, frequency and hematuria.  ?Musculoskeletal:  Negative for arthralgias and back pain.  ?Skin:  Positive for wound (Ant bites). Negative for rash.  ?Neurological:  Negative for dizziness, speech difficulty, weakness, numbness and headaches.  ? ?Physical Exam ?Updated Vital Signs ?BP (!) 147/88   Pulse 87   Temp 97.8 ?F (36.6 ?C)   Resp 16   Ht 5\' 9"  (1.753 m)  Wt 127 kg   SpO2 99%   BMI 41.35 kg/m?  ?Physical Exam ?Constitutional:   ?   Appearance: He is well-developed.  ?HENT:  ?   Head: Normocephalic and atraumatic.  ?   Mouth/Throat:  ?   Comments: No visible angioedema, no trismus ?Eyes:  ?   Pupils: Pupils are equal, round, and reactive to light.  ?Cardiovascular:  ?   Rate and Rhythm: Normal rate and regular rhythm.  ?   Heart sounds: Normal heart sounds.  ?Pulmonary:  ?   Effort: Pulmonary effort is normal. No respiratory distress.  ?   Breath sounds: Normal breath sounds. No wheezing or rales.  ?Chest:  ?   Chest wall: No tenderness.  ?Abdominal:  ?   General: Bowel sounds are normal.  ?    Palpations: Abdomen is soft.  ?   Tenderness: There is no abdominal tenderness. There is no guarding or rebound.  ?Musculoskeletal:     ?   General: Normal range of motion.  ?   Cervical back: Normal range of motion and neck supple.  ?   Comments: Patient has calamine lotion on his arms and legs.  No swelling.  No rashes noted.  Small bite marks.  ?Lymphadenopathy:  ?   Cervical: No cervical adenopathy.  ?Skin: ?   General: Skin is warm and dry.  ?   Findings: No rash.  ?Neurological:  ?   Mental Status: He is alert and oriented to person, place, and time.  ? ? ?ED Results / Procedures / Treatments   ?Labs ?(all labs ordered are listed, but only abnormal results are displayed) ?Labs Reviewed - No data to display ? ?EKG ?None ? ?Radiology ?No results found. ? ?Procedures ?Procedures  ? ? ?Medications Ordered in ED ?Medications  ?dexamethasone (DECADRON) tablet 6 mg (6 mg Oral Given 06/16/21 1601)  ? ? ?ED Course/ Medical Decision Making/ A&P ?  ?                        ?Medical Decision Making ?Risk ?Prescription drug management. ? ? ?Patient is a 61 year old who presents with allergic reaction after ant bites.  He had some tingling of his lips.  This is resolved after he took Benadryl at home.  He was given a dose of Decadron here in the ED.  I do not appreciate any other angioedema.  He had a little bit of blistering around the ant bites earlier but says this is since resolved.  Overall is well-appearing.  I do not feel that epi was indicated.  He was monitored for about an hour and a half in the ED and did not have any further symptoms.  At this point he does not need hospitalization.  No signs of anaphylaxis.  He was discharged home in good condition.  He was advised to continue using Benadryl and calamine lotion for symptomatic relief.  Return precautions were given. ? ?Final Clinical Impression(s) / ED Diagnoses ?Final diagnoses:  ?Insect bite, unspecified site, initial encounter  ?Allergic reaction, initial  encounter  ? ? ?Rx / DC Orders ?ED Discharge Orders   ? ? None  ? ?  ? ? ?  ?Rolan Bucco, MD ?06/16/21 1716 ? ?

## 2021-08-29 IMAGING — DX DG CHEST 1V PORT
1 series · 1 of 1 positions shown · non-contrast
Comparison: 10/15/2016

CLINICAL DATA: Shortness of breath.  COVID.

EXAM:
PORTABLE CHEST 1 VIEW

[chest ap]
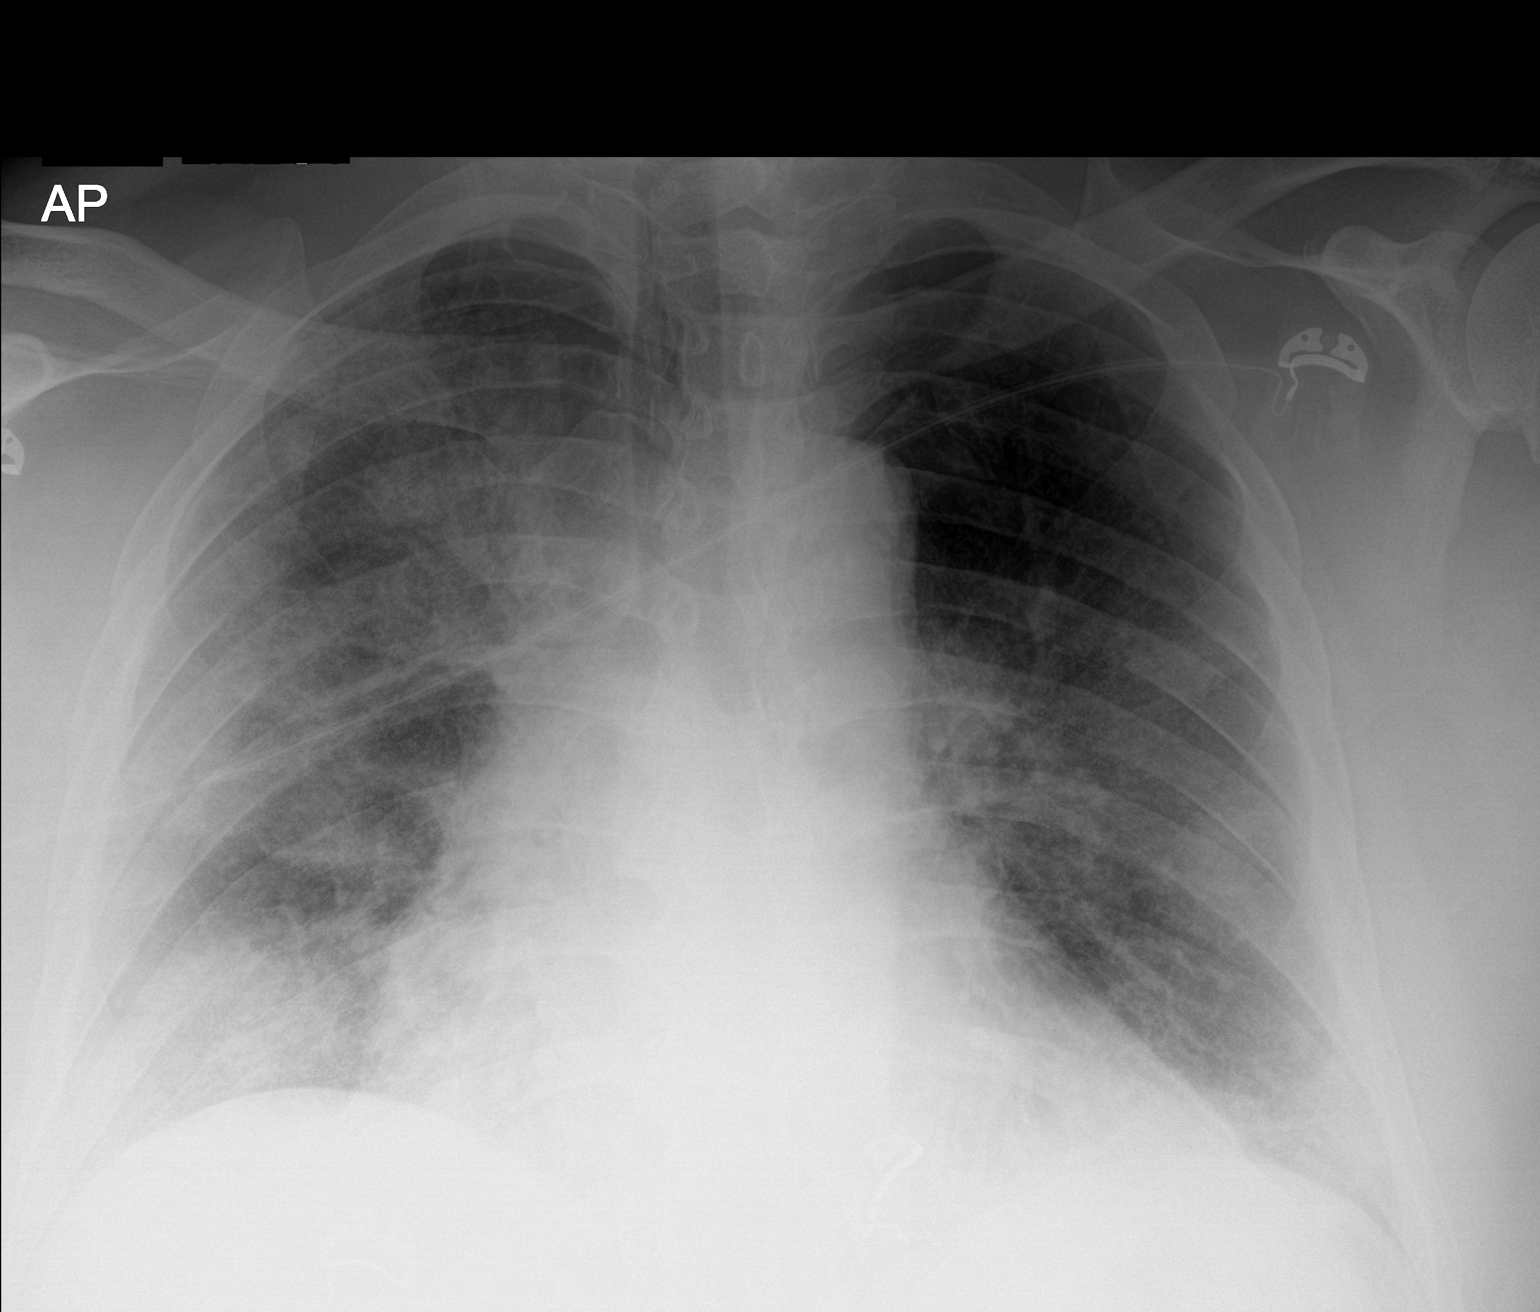

[1 of 1 positions shown; findings below may reference images not displayed]

FINDINGS: Multifocal airspace opacities throughout the right greater than left
lungs. No visible pleural effusions. No pneumothorax. Cardiac
silhouette is accentuated by low lung volumes and portable
technique. No acute osseous abnormality.
IMPRESSION: Multifocal right greater than left airspace opacities, compatible
with multifocal pneumonia and reported history of COVID.

## 2021-08-31 IMAGING — DX DG CHEST 1V PORT
1 series · 1 of 1 positions shown · non-contrast
Comparison: Chest radiograph and chest CT October 30, 2019

CLINICAL DATA: [id number] positive with airspace opacity

EXAM:
PORTABLE CHEST 1 VIEW

[chest ap]
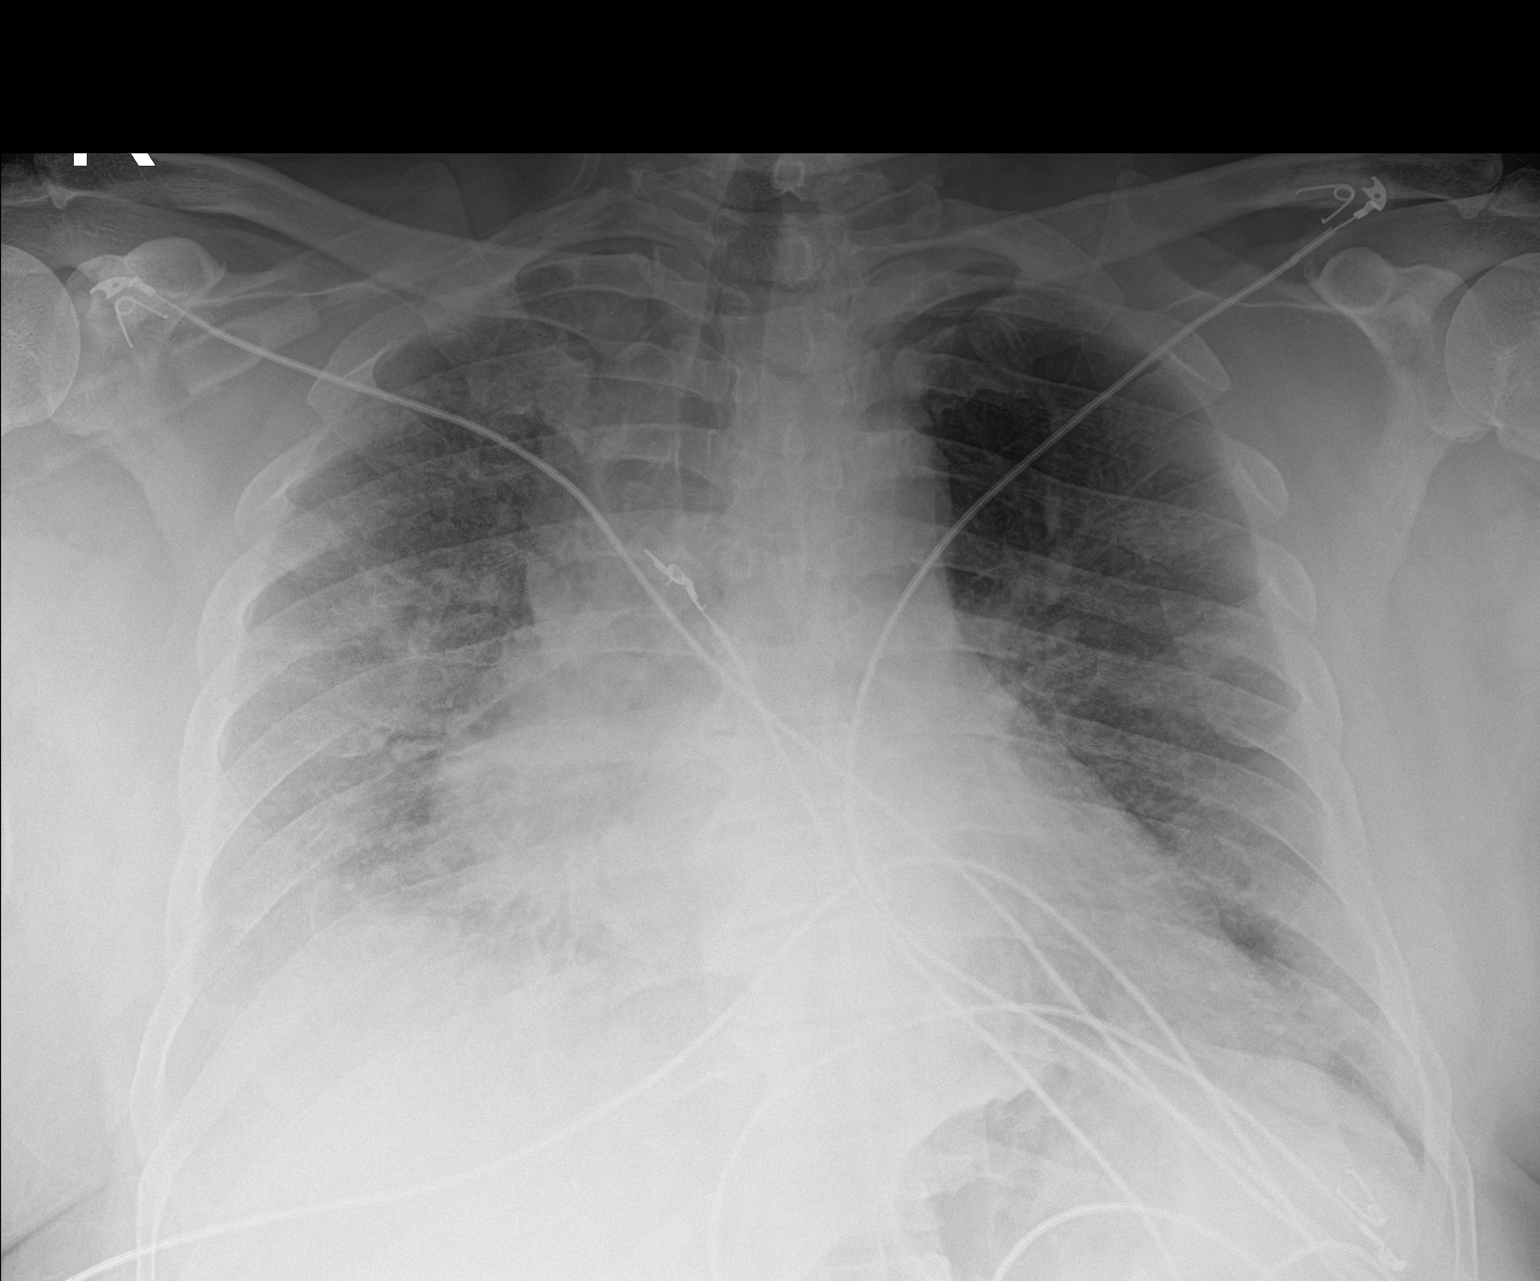

[1 of 1 positions shown; findings below may reference images not displayed]

FINDINGS: Airspace opacity is again noted throughout much of the right lung
with greatest concentration of opacity in the right mid and lower
lung regions. Subtle opacity noted in the left mid and lower lung
regions. Stable cardiac enlargement. Pulmonary vascularity normal.
No adenopathy. No bone lesions.
IMPRESSION: Stable multifocal airspace opacity, consistent with known atypical
organism pneumonia. No new areas of opacity evident. Stable cardiac
prominence. No adenopathy appreciable.

## 2021-12-31 ENCOUNTER — Other Ambulatory Visit: Payer: Self-pay

## 2021-12-31 DIAGNOSIS — Z79899 Other long term (current) drug therapy: Secondary | ICD-10-CM | POA: Insufficient documentation

## 2021-12-31 DIAGNOSIS — U071 COVID-19: Secondary | ICD-10-CM | POA: Diagnosis not present

## 2021-12-31 DIAGNOSIS — Z1152 Encounter for screening for COVID-19: Secondary | ICD-10-CM | POA: Insufficient documentation

## 2021-12-31 DIAGNOSIS — Z87891 Personal history of nicotine dependence: Secondary | ICD-10-CM | POA: Insufficient documentation

## 2021-12-31 DIAGNOSIS — I10 Essential (primary) hypertension: Secondary | ICD-10-CM | POA: Insufficient documentation

## 2022-01-01 ENCOUNTER — Encounter (HOSPITAL_BASED_OUTPATIENT_CLINIC_OR_DEPARTMENT_OTHER): Payer: Self-pay | Admitting: Emergency Medicine

## 2022-01-01 ENCOUNTER — Other Ambulatory Visit: Payer: Self-pay

## 2022-01-01 ENCOUNTER — Emergency Department (HOSPITAL_BASED_OUTPATIENT_CLINIC_OR_DEPARTMENT_OTHER)
Admission: EM | Admit: 2022-01-01 | Discharge: 2022-01-01 | Disposition: A | Discharge status: Home / Self Care | Payer: Self-pay | Attending: Emergency Medicine | Admitting: Emergency Medicine

## 2022-01-01 DIAGNOSIS — U071 COVID-19: Secondary | ICD-10-CM

## 2022-01-01 LAB — RESP PANEL BY RT-PCR (FLU A&B, COVID) ARPGX2
Influenza A by PCR: NEGATIVE
Influenza B by PCR: NEGATIVE
SARS Coronavirus 2 by RT PCR: POSITIVE — AB

## 2022-01-01 LAB — BASIC METABOLIC PANEL
Anion gap: 10 (ref 5–15)
BUN: 12 mg/dL (ref 6–20)
CO2: 19 mmol/L — ABNORMAL LOW (ref 22–32)
Calcium: 8.3 mg/dL — ABNORMAL LOW (ref 8.9–10.3)
Chloride: 107 mmol/L (ref 98–111)
Creatinine, Ser: 1.07 mg/dL (ref 0.61–1.24)
GFR, Estimated: >60 mL/min (ref >60)
Glucose, Bld: 96 mg/dL (ref 70–99)
Potassium: 4.1 mmol/L (ref 3.5–5.1)
Sodium: 136 mmol/L (ref 135–145)

## 2022-01-01 MED ORDER — NIRMATRELVIR/RITONAVIR (PAXLOVID)TABLET: 3.0000 | ORAL_TABLET | Freq: Two times a day (BID) | ORAL | 0 refills | Status: AC

## 2022-01-01 NOTE — ED Triage Notes (Signed)
Reports cough x 1-2 days and feeling "hot and cold". Pt taking vicks cold & flu meds OTC with some relief. Denies SHOB and chest pain.

## 2022-01-01 NOTE — ED Provider Notes (Signed)
[facility] EM[facility]PART[facility]  Provider Note  CSN: [id number] Arrival date & time: 12/31/21 2337  History Chief Complaint  Patient presents with   Cough    Brandon Snow is a 61 y.o. male with history of HTN, tobacco use reports cough, chills, body aches and general malaise for 2 days. Brandon Snow is unsure if Brandon Snow was running a fever. Brandon Snow had a severe case of Covid in 2019 requiring admission and high flow oxygen for about 2 weeks. Brandon Snow has since fully recovered but Brandon Snow remains unvaccinated.    Home Medications Prior to Admission medications   Medication Sig Start Date End Date Taking? Authorizing Provider  nirmatrelvir/ritonavir EUA (PAXLOVID) 20 x 150 MG & 10 x 100MG  TABS Take 3 tablets by mouth 2 (two) times daily for 5 days. Patient GFR is >60. Take nirmatrelvir (150 mg) two tablets twice daily for 5 days and ritonavir (100 mg) one tablet twice daily for 5 days. 01/01/22 01/06/22 Yes 14/7/23, MD  Ascorbic Acid (VITAMIN C) 1000 MG tablet Take 1,000 mg by mouth daily.    [provider]  cyclobenzaprine (FLEXERIL) 10 MG tablet Take 1 tablet (10 mg total) by mouth 3 (three) times daily as needed for muscle spasms. 12/13/20   12/15/20, PA-C  escitalopram (LEXAPRO) 10 MG tablet TAKE 1 TABLET BY MOUTH EVERY DAY 01/15/20   01/17/20, NP  gabapentin (NEURONTIN) 100 MG capsule Take 1 capsule (100 mg total) by mouth 2 (two) times daily. 06/16/20   06/18/20, NP  hydrOXYzine (ATARAX/VISTARIL) 10 MG tablet Take 1 tablet (10 mg total) by mouth 3 (three) times daily as needed. 11/27/19   11/29/19, NP  metoprolol tartrate (LOPRESSOR) 50 MG tablet Take 1 tablet (50 mg total) by mouth 2 (two) times daily. 11/14/19   11/16/19, MD  metroNIDAZOLE (FLAGYL) 500 MG tablet Take 1 tablet (500 mg total) by mouth 2 (two) times daily. 12/13/20   12/15/20, PA-C  Multiple Vitamin (MULTIVITAMIN) tablet Take 1 tablet by mouth daily.    [provider]  naproxen (NAPROSYN) 500 MG tablet Take 1 tablet (500 mg total) by mouth 2 (two) times daily. 12/13/20   12/15/20, PA-C  tetrahydrozoline-zinc (VISINE-AC) 0.05-0.25 % ophthalmic solution Place 1 drop into both eyes 3 (three) times daily as needed (dry eyes).    [provider]     Allergies    Patient has no known allergies.   Review of Systems   Review of Systems Please see HPI for pertinent positives and negatives  Physical Exam BP (!) 145/86 (BP Location: Right Arm)   Pulse (!) 102   Temp 99.3 F (37.4 C) (Oral)   Resp 20   Ht 5\' 9"  (1.753 m)   Wt 128.4 kg   SpO2 97%   BMI 41.79 kg/m   Physical Exam Vitals and nursing note reviewed.  HENT:     Head: Normocephalic.     Nose: Nose normal.  Eyes:     Extraocular Movements: Extraocular movements intact.  Pulmonary:     Effort: Pulmonary effort is normal.  Musculoskeletal:        General: Normal range of motion.     Cervical back: Neck supple.  Skin:    Findings: No rash (on exposed skin).  Neurological:     Mental Status: Brandon Snow is alert and oriented to person, place, and time.  Psychiatric:  Mood and Affect: Mood normal.     ED Results / Procedures / Treatments   EKG None  Procedures Procedures  Medications Ordered in the ED Medications - No data to display  Initial Impression and Plan  Patient here with viral URI symptoms, Covid swab in triage is positive. Brandon Snow has not had labs checked in over a year, would benefit from Paxlovid, so will check BMP to confirm normal renal function. Otherwise Brandon Snow is well appearing, no distress and normal SpO2. Encouraged to stop smoking and to get vaccinated when Brandon Snow is recovered. Given standard quarantine instructions.   ED Course   Clinical Course as of 01/01/22 0308  Sat Jan 01, 2022  0307 BMP with normal renal function. Rx for Paxlovid, PCP follow up. RTED for worsening.  [CS]    Clinical Course User Index [CS] Pollyann Savoy,  MD     MDM Rules/Calculators/A&P Medical Decision Making Problems Addressed: COVID-19: acute illness or injury  Amount and/or Complexity of Data Reviewed Labs: ordered. Decision-making details documented in ED Course.  Risk Prescription drug management.    Final Clinical Impression(s) / ED Diagnoses Final diagnoses:  COVID-19    Rx / DC Orders ED Discharge Orders          Ordered    nirmatrelvir/ritonavir EUA (PAXLOVID) 20 x 150 MG & 10 x 100MG  TABS  2 times daily        01/01/22 0306             14/02/23, MD 01/01/22 [phone]

## 2022-02-05 ENCOUNTER — Encounter (HOSPITAL_BASED_OUTPATIENT_CLINIC_OR_DEPARTMENT_OTHER): Payer: Self-pay | Admitting: Emergency Medicine

## 2022-02-05 ENCOUNTER — Emergency Department (HOSPITAL_BASED_OUTPATIENT_CLINIC_OR_DEPARTMENT_OTHER)
Admission: EM | Admit: 2022-02-05 | Discharge: 2022-02-05 | Disposition: A | Discharge status: Home / Self Care | Payer: 59 | Attending: Emergency Medicine | Admitting: Emergency Medicine

## 2022-02-05 ENCOUNTER — Other Ambulatory Visit: Payer: Self-pay

## 2022-02-05 DIAGNOSIS — Y9389 Activity, other specified: Secondary | ICD-10-CM | POA: Insufficient documentation

## 2022-02-05 DIAGNOSIS — Y998 Other external cause status: Secondary | ICD-10-CM | POA: Diagnosis not present

## 2022-02-05 DIAGNOSIS — X58XXXA Exposure to other specified factors, initial encounter: Secondary | ICD-10-CM | POA: Diagnosis not present

## 2022-02-05 DIAGNOSIS — S3992XA Unspecified injury of lower back, initial encounter: Secondary | ICD-10-CM | POA: Diagnosis not present

## 2022-02-05 DIAGNOSIS — Y9289 Other specified places as the place of occurrence of the external cause: Secondary | ICD-10-CM | POA: Diagnosis not present

## 2022-02-05 DIAGNOSIS — S39012A Strain of muscle, fascia and tendon of lower back, initial encounter: Secondary | ICD-10-CM | POA: Diagnosis not present

## 2022-02-05 DIAGNOSIS — I10 Essential (primary) hypertension: Secondary | ICD-10-CM | POA: Diagnosis not present

## 2022-02-05 MED ORDER — PREDNISONE 10 MG PO TABS: 40.0000 mg | ORAL_TABLET | Freq: Every day | ORAL | 0 refills | Status: AC

## 2022-02-05 MED ORDER — LIDOCAINE 5 % EX PTCH
1.0000 | MEDICATED_PATCH | CUTANEOUS | 0 refills | Status: DC
Start: 2022-02-05 — End: 2022-11-28

## 2022-02-05 MED ORDER — CYCLOBENZAPRINE HCL 10 MG PO TABS: 10.0000 mg | ORAL_TABLET | Freq: Three times a day (TID) | ORAL | 0 refills | Status: DC | PRN

## 2022-02-05 NOTE — ED Provider Notes (Signed)
[facility] Provider Note   CSN: [id number] Arrival date & time: 02/05/22  2111     History  Chief Complaint  Patient presents with   Back Pain    Brandon Snow is a 62 y.o. male.  Patient with history of hypertension presents today with complaints of back pain. He states that same has been ongoing for the past 2 weeks. Endorses history of same in 4/22 when he was seen at urgent care and given prednisone and flexeril which he states resolved his pain. He denies any known injury or overuse. Denies any loss of bowel or bladder function or saddle paresthesias.  He is able to ambulate with some pain.  States the pain is located on the left side consistent with previous.  Denies any hematuria or dysuria.  No history of malignancy or long-term steroid use.  No sharp shooting pain down his legs or numbness/tingling in extremities.  Denies fevers or chills.  The history is provided by the patient. No language interpreter was used.  Back Pain      Home Medications Prior to Admission medications   Medication Sig Start Date End Date Taking? Authorizing Provider  Ascorbic Acid (VITAMIN C) 1000 MG tablet Take 1,000 mg by mouth daily.    [provider]  cyclobenzaprine (FLEXERIL) 10 MG tablet Take 1 tablet (10 mg total) by mouth 3 (three) times daily as needed for muscle spasms. 12/13/20   Hughie Closs, PA-C  escitalopram (LEXAPRO) 10 MG tablet TAKE 1 TABLET BY MOUTH EVERY DAY 01/15/20   Fenton Foy, NP  gabapentin (NEURONTIN) 100 MG capsule Take 1 capsule (100 mg total) by mouth 2 (two) times daily. 06/16/20   Ronnell Freshwater, NP  hydrOXYzine (ATARAX/VISTARIL) 10 MG tablet Take 1 tablet (10 mg total) by mouth 3 (three) times daily as needed. 11/27/19   Fenton Foy, NP  metoprolol tartrate (LOPRESSOR) 50 MG tablet Take 1 tablet (50 mg total) by mouth 2 (two) times daily. 11/14/19   Thurnell Lose, MD  metroNIDAZOLE (FLAGYL) 500 MG  tablet Take 1 tablet (500 mg total) by mouth 2 (two) times daily. 12/13/20   Hughie Closs, PA-C  Multiple Vitamin (MULTIVITAMIN) tablet Take 1 tablet by mouth daily.    [provider]  naproxen (NAPROSYN) 500 MG tablet Take 1 tablet (500 mg total) by mouth 2 (two) times daily. 12/13/20   Hughie Closs, PA-C  tetrahydrozoline-zinc (VISINE-AC) 0.05-0.25 % ophthalmic solution Place 1 drop into both eyes 3 (three) times daily as needed (dry eyes).    [provider]      Allergies    Patient has no known allergies.    Review of Systems   Review of Systems  Musculoskeletal:  Positive for back pain.  All other systems reviewed and are negative.   Physical Exam Updated Vital Signs BP (!) 143/83 (BP Location: Right Arm)   Pulse 89   Temp 98 F (36.7 C) (Oral)   Resp 18   Ht 5\' 8"  (1.727 m)   Wt 129.3 kg   SpO2 94%   BMI 43.33 kg/m  Physical Exam Vitals and nursing note reviewed.  Constitutional:      General: He is not in acute distress.    Appearance: Normal appearance. He is normal weight. He is not ill-appearing, toxic-appearing or diaphoretic.  HENT:     Head: Normocephalic and atraumatic.  Cardiovascular:     Rate and Rhythm: Normal rate.  Pulmonary:  Effort: Pulmonary effort is normal. No respiratory distress.  Abdominal:     General: Abdomen is flat.     Palpations: Abdomen is soft.  Musculoskeletal:        General: Normal range of motion.     Cervical back: Normal range of motion.     Comments: No midline cervical spine tenderness to palpation over cervical, thoracic, or lumbar spine.  No overlying skin changes, step-offs, lesions, or deformity.  Mild paraspinous tenderness to palpation on the left side.  Patient observed to be ambulatory with steady gait.  Skin:    General: Skin is warm and dry.  Neurological:     General: No focal deficit present.     Mental Status: He is alert.  Psychiatric:        Mood and Affect: Mood normal.         Behavior: Behavior normal.     ED Results / Procedures / Treatments   Labs (all labs ordered are listed, but only abnormal results are displayed) Labs Reviewed - No data to display  EKG None  Radiology No results found.  Procedures Procedures    Medications Ordered in ED Medications - No data to display  ED Course/ Medical Decision Making/ A&P                           Medical Decision Making  Patient presents today with complaints of back pain x 2 weeks.  He is afebrile, nontoxic-appearing, and in no acute distress with reassuring vital signs.  He is without any neurological deficits and normal neuro exam. Patient can walk but states is painful.  No loss of bowel or bladder control.  No concern for cauda equina.  No fever, night sweats, weight loss, h/o cancer, IVDU.  No evidence of spinal trauma or bony injury so will defer x-ray imaging today. Suspect muscular in nature/inflammatory.  Patient with history of same managed successfully with prednisone, Flexeril, and over-the-counter anti-inflammatories. Will send for same. Will also give lidocaine patches for additional relief and give information about stretching exercises. RICE protocol and pain medicine indicated and discussed with patient.  Patient is understanding and amenable with plan, educated on red flag symptoms that would prompt immediate return.  Patient discharged in stable condition.   Final Clinical Impression(s) / ED Diagnoses Final diagnoses:  Strain of lumbar region, initial encounter    Rx / DC Orders ED Discharge Orders          Ordered    cyclobenzaprine (FLEXERIL) 10 MG tablet  3 times daily PRN       Note to Pharmacy: Do Not drink alcohol or drive while taking this medication   02/05/22 2238    predniSONE (DELTASONE) 10 MG tablet  Daily        02/05/22 2238    lidocaine (LIDODERM) 5 %  Every 24 hours        02/05/22 2238          An After Visit Summary was printed and given to the  patient.     Vear Clock 02/05/22 2240    Melene Plan, DO 02/05/22 2249

## 2022-02-05 NOTE — ED Triage Notes (Signed)
Pt w/ lower back pain x 2wk, no injury; hx of sciatica

## 2022-02-05 NOTE — Discharge Instructions (Addendum)
As we discussed, I have given you a prescription for a muscle relaxer, a steroid, and lidocaine patches for you to take for management of your pain.  I also recommend that you take over-the-counter anti-inflammatory medication such as Tylenol and ibuprofen.  Do not drive or operate heavy machinery after taking Flexeril as it can be sedating.  I have also attached some stretching exercises for you to try at home to help with your pain.  I also recommend that you rest and use a heating pad.  Follow-up with your primary care doctor for continued evaluation and management of the symptoms  Return if development of any new or worsening symptoms.

## 2022-03-01 ENCOUNTER — Ambulatory Visit: Payer: 59 | Admitting: Nurse Practitioner

## 2022-04-09 IMAGING — CR DG LUMBAR SPINE 2-3V
3 series · 3 of 3 positions shown · non-contrast
Comparison: 06/22/2011

CLINICAL DATA: Low back pain, left-sided sciatica

EXAM:
LUMBAR SPINE - 2-3 VIEW

[w lumbar spine ap]
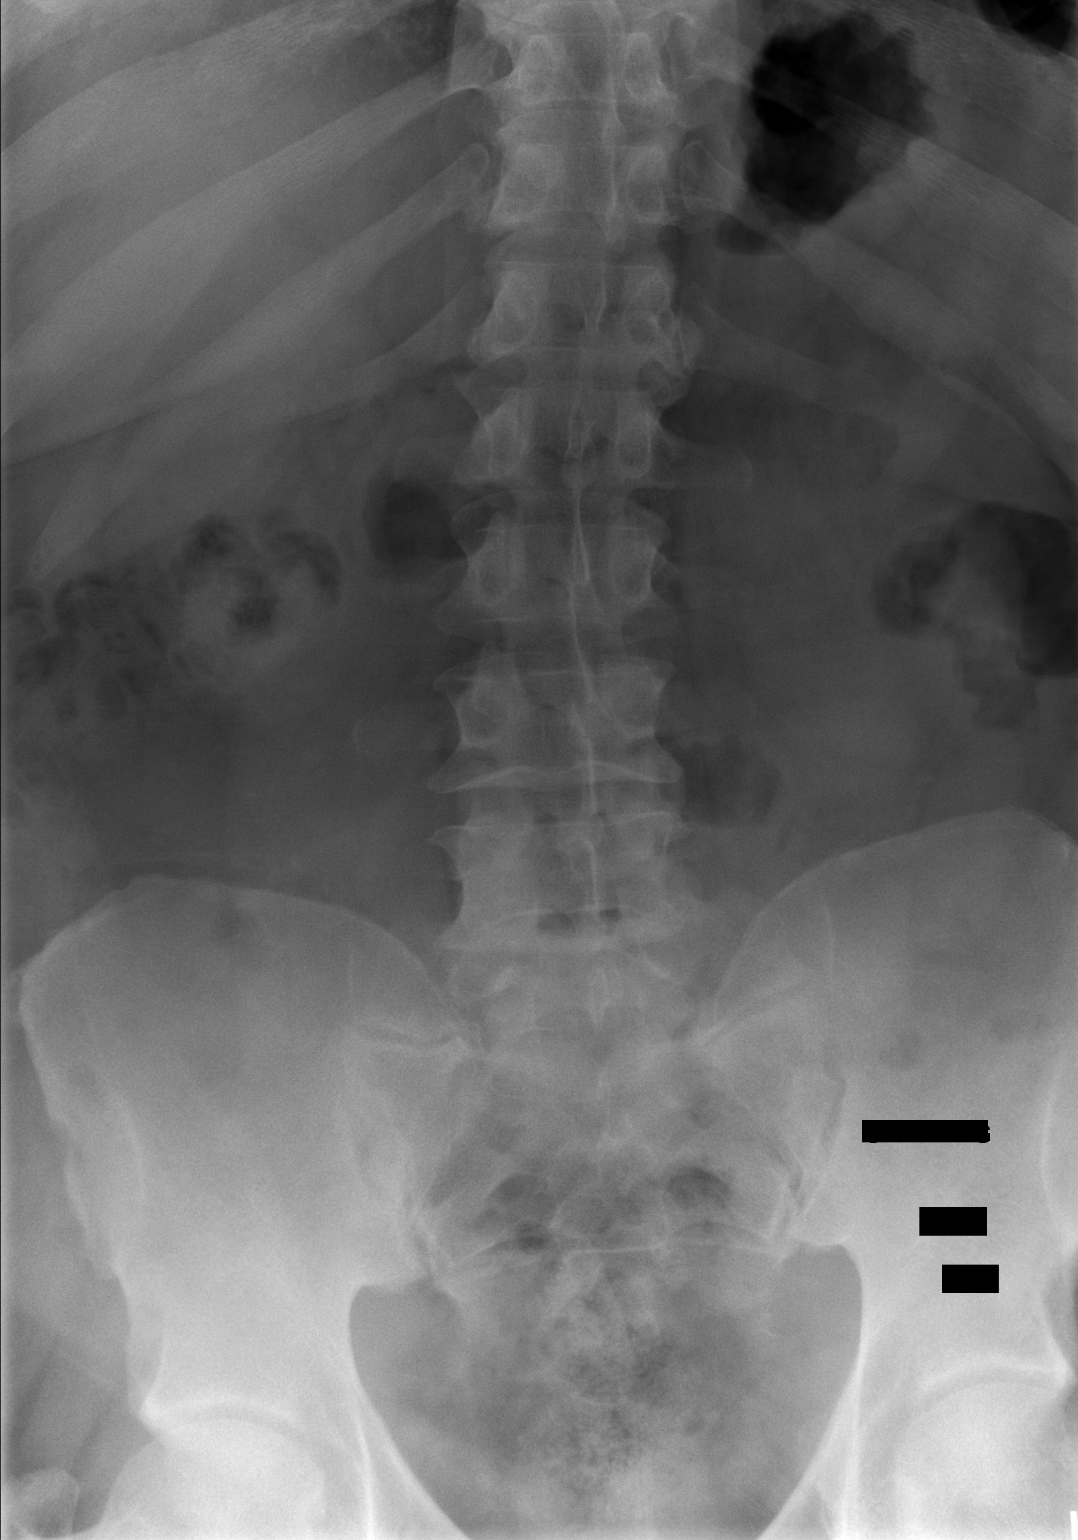

[w lumbar spine lat]
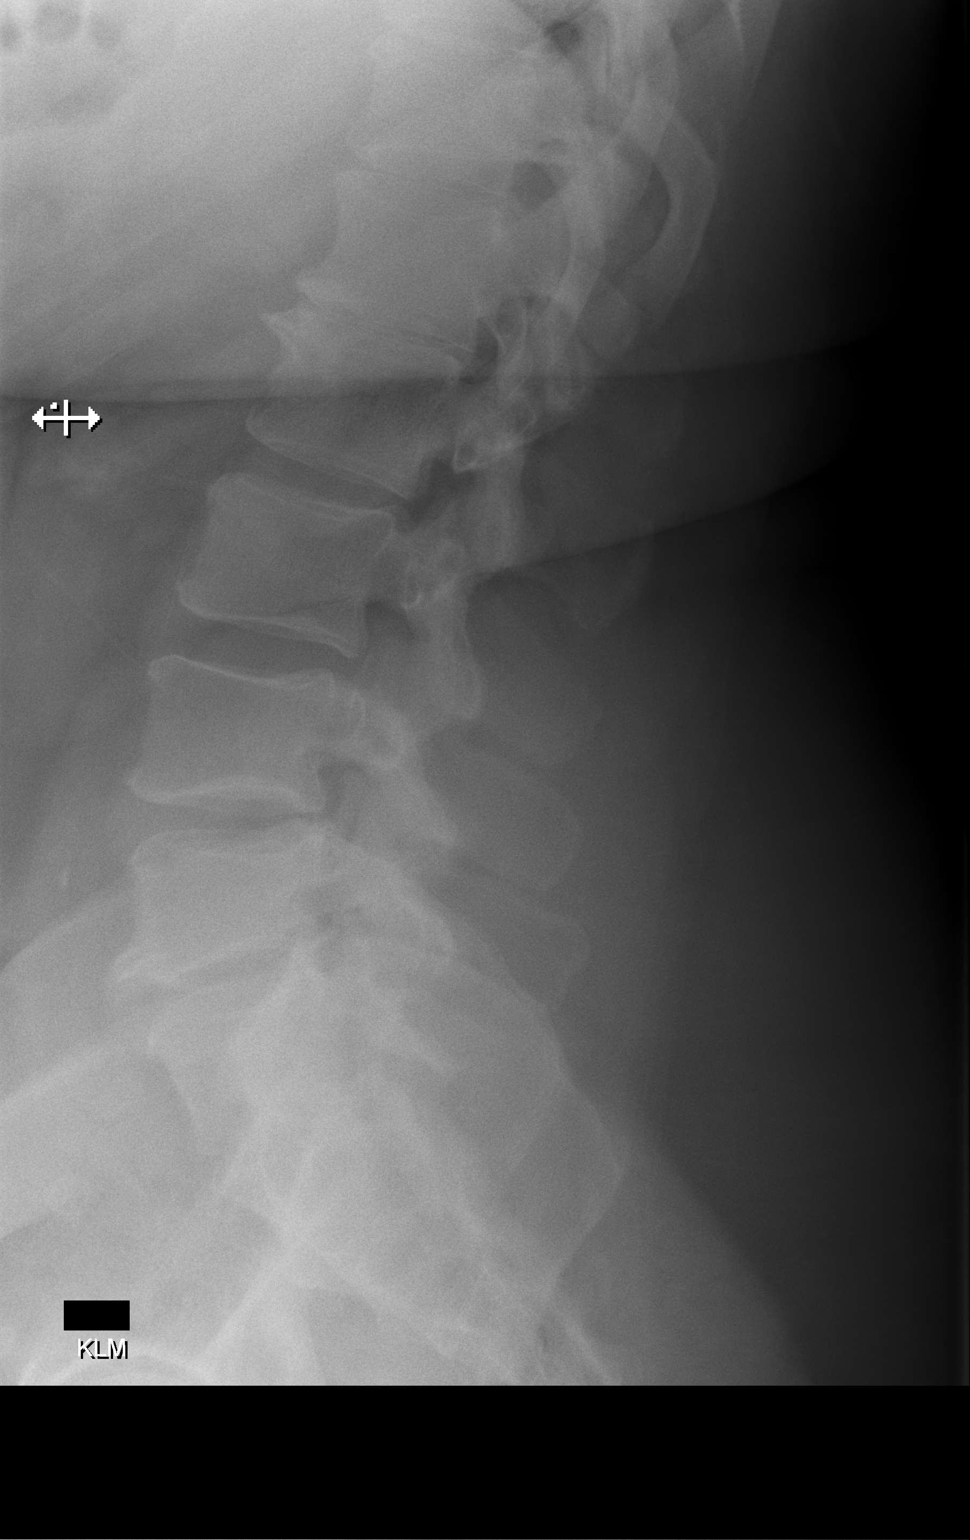

[w lumbar l-5 s-1 spot]
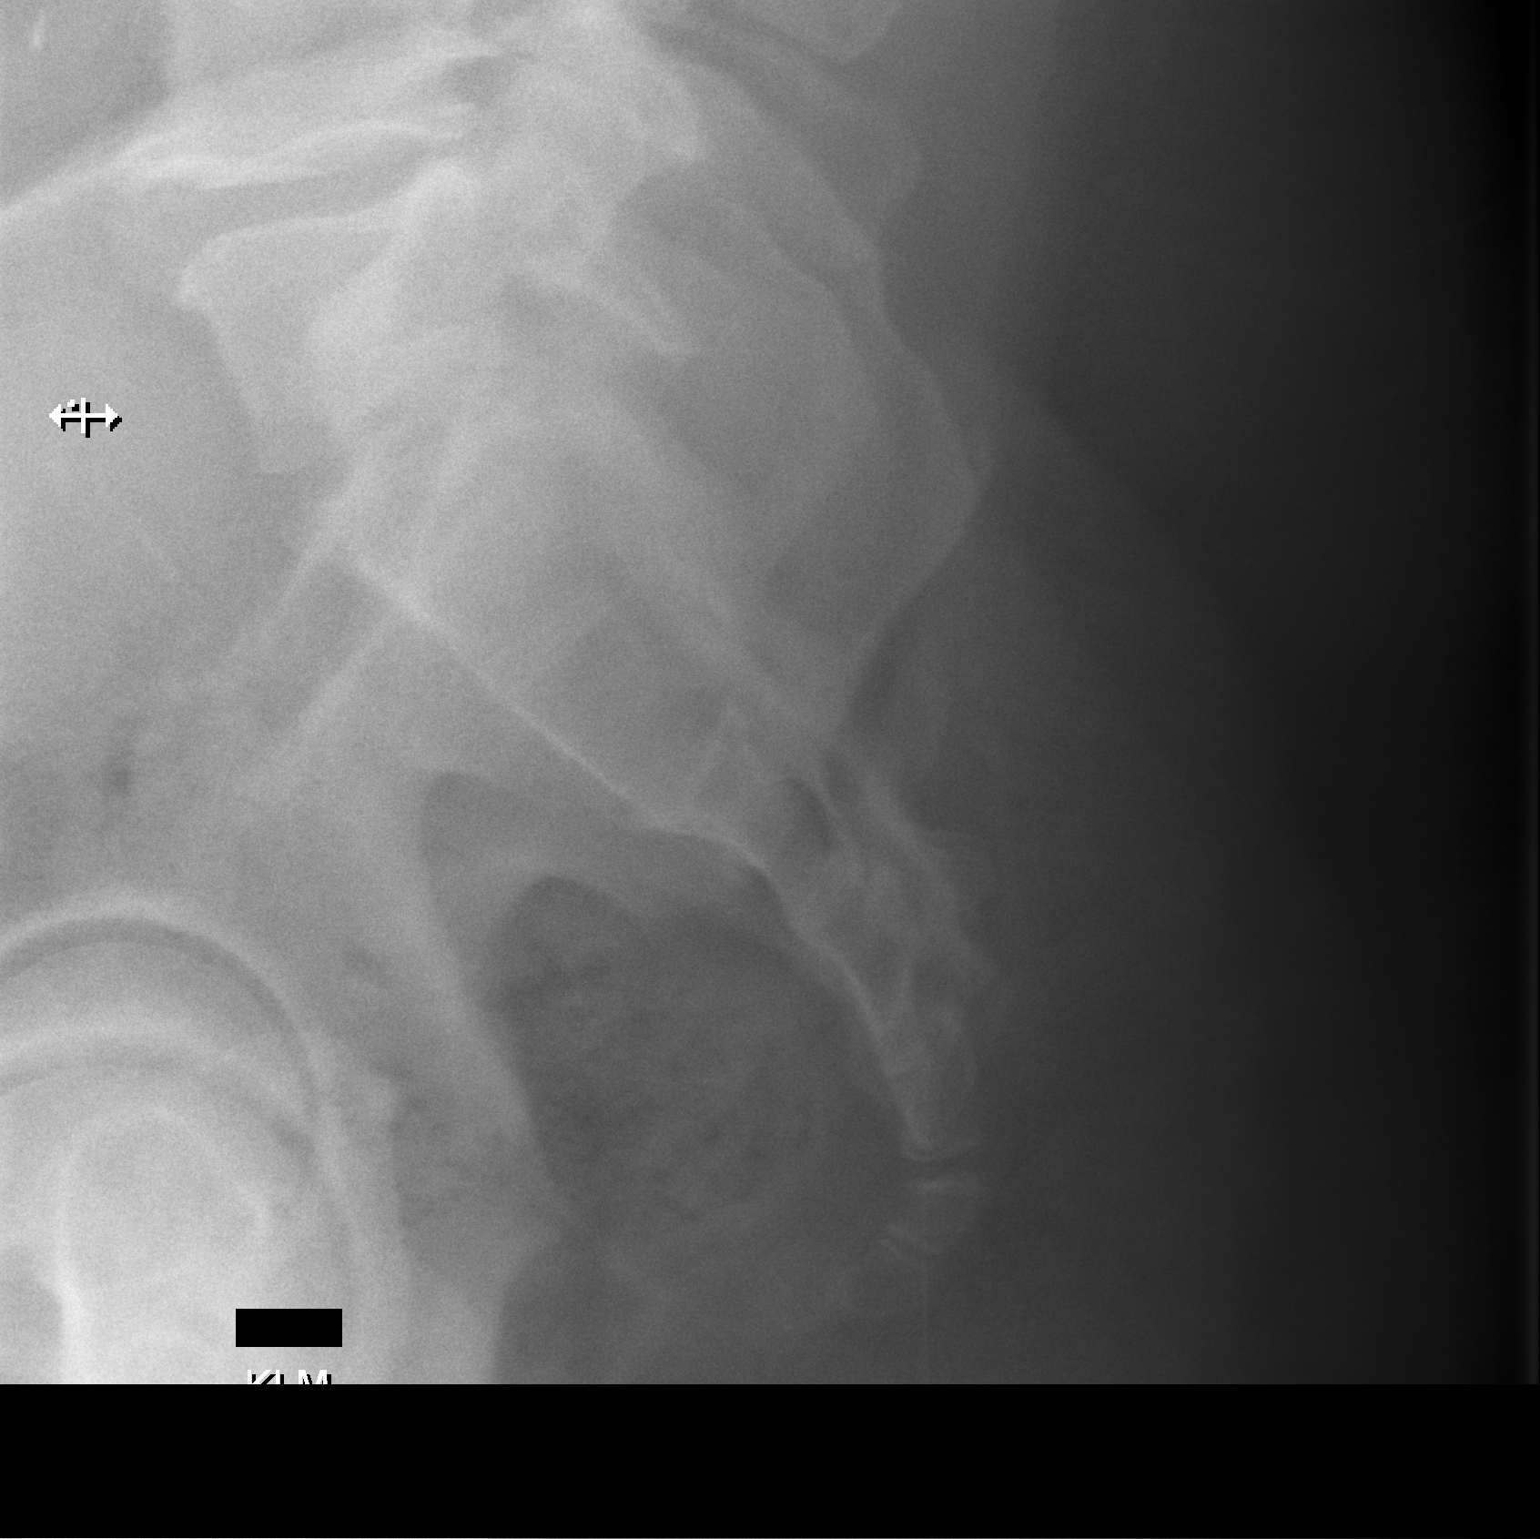

[3 of 3 positions shown; findings below may reference images not displayed]

FINDINGS: Frontal and lateral views of the lumbar spine demonstrate 5
non-rib-bearing lumbar type vertebral bodies, with bilateral
sacralization at the L5 level unchanged. Mild right convex
scoliosis. No acute fractures. There is prominent multilevel
spondylosis and facet hypertrophy, with disc space narrowing
greatest at the L1/L2 and L5/S1 levels. Mild diffuse facet
hypertrophy greatest at the lumbosacral junction. The sacroiliac
joints are normal.
IMPRESSION: 1. Multilevel spondylosis and facet hypertrophy as above.
2. Mild right convex scoliosis.
3. No acute fracture.

## 2022-11-06 ENCOUNTER — Other Ambulatory Visit: Payer: Self-pay

## 2022-11-06 ENCOUNTER — Emergency Department (HOSPITAL_BASED_OUTPATIENT_CLINIC_OR_DEPARTMENT_OTHER)
Admission: EM | Admit: 2022-11-06 | Discharge: 2022-11-06 | Disposition: A | Discharge status: Home / Self Care | Payer: 59 | Attending: Emergency Medicine | Admitting: Emergency Medicine

## 2022-11-06 DIAGNOSIS — M5442 Lumbago with sciatica, left side: Secondary | ICD-10-CM | POA: Diagnosis not present

## 2022-11-06 DIAGNOSIS — G8929 Other chronic pain: Secondary | ICD-10-CM | POA: Insufficient documentation

## 2022-11-06 DIAGNOSIS — M545 Low back pain, unspecified: Secondary | ICD-10-CM | POA: Diagnosis not present

## 2022-11-06 MED ORDER — PREDNISONE 10 MG PO TABS: ORAL_TABLET | ORAL | 0 refills | Status: AC

## 2022-11-06 NOTE — ED Triage Notes (Signed)
Pt reports back pain x 1 month.

## 2022-11-06 NOTE — ED Provider Notes (Addendum)
[facility] Provider Note   CSN: [id number] Arrival date & time: 11/06/22  1117     History  Chief Complaint  Patient presents with   Back Pain    Brandon Snow is a 62 y.o. male with history of left lower back pain with left-sided sciatica presenting with concern for left lower back pain with intermittent pain sometimes goes down into his thigh.  States this has been ongoing for 3 months.  The pain will come and go but worsens with activity such as the stair climber at the gym.  He is taking ibuprofen at home for pain.  Not currently having any pain in the ER.  Denies any saddle anesthesia, loss of bowel or bladder control, urinary retention, history of malignancy, IV drug use, fever or chills.  Denies any hematuria or dysuria.   Back Pain      Home Medications Prior to Admission medications   Medication Sig Start Date End Date Taking? Authorizing Provider  predniSONE (DELTASONE) 10 MG tablet Take 4 tablets (40 mg total) by mouth daily with breakfast for 2 days, THEN 3 tablets (30 mg total) daily with breakfast for 2 days, THEN 2 tablets (20 mg total) daily with breakfast for 2 days, THEN 1 tablet (10 mg total) daily with breakfast for 1 day. 11/06/22 11/13/22 Yes Arabella Merles, PA-C  Ascorbic Acid (VITAMIN C) 1000 MG tablet Take 1,000 mg by mouth daily.    [provider]  cyclobenzaprine (FLEXERIL) 10 MG tablet Take 1 tablet (10 mg total) by mouth 3 (three) times daily as needed for muscle spasms. 02/05/22   Smoot, Sarah A, PA-C  escitalopram (LEXAPRO) 10 MG tablet TAKE 1 TABLET BY MOUTH EVERY DAY 01/15/20   Ivonne Andrew, NP  gabapentin (NEURONTIN) 100 MG capsule Take 1 capsule (100 mg total) by mouth 2 (two) times daily. 06/16/20   Carlean Jews, NP  hydrOXYzine (ATARAX/VISTARIL) 10 MG tablet Take 1 tablet (10 mg total) by mouth 3 (three) times daily as needed. 11/27/19   Ivonne Andrew, NP  lidocaine (LIDODERM)  5 % Place 1 patch onto the skin daily. Remove & Discard patch within 12 hours or as directed by MD 02/05/22   Smoot, Shawn Route, PA-C  metoprolol tartrate (LOPRESSOR) 50 MG tablet Take 1 tablet (50 mg total) by mouth 2 (two) times daily. 11/14/19   Leroy Sea, MD  metroNIDAZOLE (FLAGYL) 500 MG tablet Take 1 tablet (500 mg total) by mouth 2 (two) times daily. 12/13/20   Rushie Chestnut, PA-C  Multiple Vitamin (MULTIVITAMIN) tablet Take 1 tablet by mouth daily.    [provider]  naproxen (NAPROSYN) 500 MG tablet Take 1 tablet (500 mg total) by mouth 2 (two) times daily. 12/13/20   Rushie Chestnut, PA-C  tetrahydrozoline-zinc (VISINE-AC) 0.05-0.25 % ophthalmic solution Place 1 drop into both eyes 3 (three) times daily as needed (dry eyes).    [provider]      Allergies    Patient has no known allergies.    Review of Systems   Review of Systems  Musculoskeletal:  Positive for back pain.    Physical Exam Updated Vital Signs BP (!) 143/79 (BP Location: Right Arm)   Pulse 70   Temp 97.6 F (36.4 C) (Tympanic)   Resp 18   Ht 5\' 8"  (1.727 m)   Wt 134.3 kg   SpO2 98%   BMI 45.01 kg/m  Physical Exam Vitals and  nursing note reviewed.  Constitutional:      Appearance: Normal appearance.  HENT:     Head: Atraumatic.  Cardiovascular:     Rate and Rhythm: Normal rate and regular rhythm.  Pulmonary:     Effort: Pulmonary effort is normal.  Musculoskeletal:     Comments: Able to ambulate without difficulty Able to twist left and right without difficulty.  Able to fully flex and extend the spine without pain. No tenderness palpation of the spinous processes, SI joint, or gluteal area bilaterally  5/5 strength of the lower extremities bilaterally with hip flexion, knee flexion extension, ankle plantarflexion dorsiflexion  Intact sensation in the lower extremities bilaterally  No CVA tenderness bilaterally  Neurological:     General: No focal deficit  present.     Mental Status: He is alert.  Psychiatric:        Mood and Affect: Mood normal.        Behavior: Behavior normal.     ED Results / Procedures / Treatments   Labs (all labs ordered are listed, but only abnormal results are displayed) Labs Reviewed - No data to display  EKG None  Radiology No results found.  Procedures Procedures    Medications Ordered in ED Medications - No data to display  ED Course/ Medical Decision Making/ A&P                                 Medical Decision Making Risk Prescription drug management.    Differential diagnosis includes but is not limited to musculoskeletal lower back pain, radiculopathy, SI joint pain, arthritis, spinal stenosis, cauda equina, epidural abscess, nephrolithiasis  ED Course:  Patient with chronic left lower back pain for 3 months with some episodes of pain going into his left thigh.  No red flag signs, no concern for cauda equina or epidural abscess at this time.  No history of trauma to the area, no indication for imaging at this time.  It also appears he was seen in the ER for this problem 2 years ago upon chart review as well.  He has full range of motion of spine, no tenderness to palpation of the spine or lower back here today, 5/5 strength the lower extremities, intact sensation.  Vital signs stable, and given chronicity, no concern for nephrolithiasis at this time.  Suspect musculoskeletal lower back pain, possible radiculopathy.  He is not currently having any symptoms here today.  He denies any history of diabetes.  Will have patient complete short 7-day course of prednisone, may continue taking ibuprofen and using IcyHot on the area as needed for pain.  He is in agreement with this plan.  Impression: Left lower back pain, possible left-sided radiculopathy.  No pain here today  Disposition:  The patient was discharged home with instructions to take prednisone as prescribed.  Ibuprofen and IcyHot on the  area as needed for pain.  I recommended patient reach out to PCP for a physical therapy referral. Return precautions given.             Final Clinical Impression(s) / ED Diagnoses Final diagnoses:  Chronic left-sided low back pain with left-sided sciatica    Rx / DC Orders ED Discharge Orders          Ordered    predniSONE (DELTASONE) 10 MG tablet  Q breakfast        11/06/22 1151  Arabella Merles, PA-C 11/06/22 1224    Arabella Merles, PA-C 11/06/22 1225    Virgina Norfolk, DO 11/06/22 1349

## 2022-11-06 NOTE — Discharge Instructions (Addendum)
Please engage in light physical activity (like walking) to prevent your back pain from worsening and to prevent stiffness. Refrain from bedrest which can make your pain worse. You may use up to 800mg  ibuprofen every 8 hours as needed for pain.  Do not exceed 2.4g of ibuprofen per day.  Alternatively, you may take up to 1000mg  of tylenol every 6 hours as needed for pain.  Do not take more then 4g per day.  You may use a heating back on your back to help with the pain.   You have been prescribed prednisone which is a steroid medication to help with your back pain.  Please take this in the taper format as prescribed.  Please take this first thing in the morning as it can sometimes keep you up at night.  Please contact your PCP as you may benefit from a PT referral   Return to the ER if you have loss of bowel or bladder control, you develop fever or chills, you become very dizzy, you have shortness of breath, chest pain.

## 2022-11-14 ENCOUNTER — Encounter: Payer: Self-pay | Admitting: Family Medicine

## 2022-11-14 ENCOUNTER — Ambulatory Visit: Payer: 59 | Admitting: Family Medicine

## 2022-11-14 VITALS — BP 129/76 | HR 87 | Ht 68.0 in | Wt 302.1 lb

## 2022-11-14 DIAGNOSIS — E7800 Pure hypercholesterolemia, unspecified: Secondary | ICD-10-CM | POA: Diagnosis not present

## 2022-11-14 DIAGNOSIS — E66813 Obesity, class 3: Secondary | ICD-10-CM | POA: Diagnosis not present

## 2022-11-14 DIAGNOSIS — M5442 Lumbago with sciatica, left side: Secondary | ICD-10-CM | POA: Diagnosis not present

## 2022-11-14 DIAGNOSIS — Z6841 Body Mass Index (BMI) 40.0 and over, adult: Secondary | ICD-10-CM

## 2022-11-14 MED ORDER — MELOXICAM 15 MG PO TABS: 15.0000 mg | ORAL_TABLET | Freq: Every day | ORAL | 0 refills | Status: DC

## 2022-11-14 NOTE — Progress Notes (Unsigned)
Acute Office Visit  Subjective:     Patient ID: Brandon Snow, male    DOB: 10/28/1960, 62 y.o.   MRN: [id number]  No chief complaint on file.   HPI Patient is in today for sciatica.  Patient was seen in the ED on 10/6.  Prior to that he had been having low back pain radiating down his left leg for over a month.  This has happened to him in the past going back a few years.  Sometimes has numbness in the leg.  Pain waxes and wanes but is always there.  He will occasionally use ice.  Uses ibuprofen as well.  Does not use Tylenol.  No other treatments for this.  He wants to go to a physical therapist for exercises as well as stretching that may help him with this.  We discussed weight loss as well that could help with improvement in symptoms.  He had lost 40 pounds by cutting out junk food a few years ago and noted that his back pain almost resolved completely.  Encouraged patient to not do any heavy lifting that may be put excess load on the back.   The 10-year ASCVD risk score (Arnett DK, et al., 2019) is: 20.7%    ROS      Objective:    There were no vitals taken for this visit. {Vitals History (Optional):23777}  Physical Exam General: Alert, oriented CV: Regular rate and rhythm Pulmonary: Clear bilaterally MSK: No tenderness of the low back midline or laterally.  Negative straight leg test bilaterally.  No results found for any visits on 11/14/22.      Assessment & Plan:   There are no diagnoses linked to this encounter.   No follow-ups on file.  Sandre Kitty, MD

## 2022-11-14 NOTE — Patient Instructions (Signed)
It was nice to see you today,  We addressed the following topics today: I have sent in a referral to physical therapy.  I will also send in a prescription for meloxicam that you will take once a day instead of your ibuprofen. - I have ordered some blood test to check your cholesterol and blood sugar levels.  You can schedule a appointment upfront to have this done - I would like to see back in 2 months to talk about your blood tests and weight.   Have a great day,  Frederic Jericho, MD

## 2022-11-15 DIAGNOSIS — E66813 Obesity, class 3: Secondary | ICD-10-CM | POA: Insufficient documentation

## 2022-11-15 NOTE — Assessment & Plan Note (Signed)
Discussed weight loss.  Patient has lost weight in the past by simply cutting out empty calories.  Will check A1c, lipid panel, CMP.  Will follow-up in 2 months.

## 2022-11-15 NOTE — Assessment & Plan Note (Signed)
Referral to PT placed.  Patient prescribed meloxicam and advised not to use ibuprofen while taking it.  Weight loss encouraged.  Encouraged to continue activity but avoid overexertion or heavy lifting that may increase strain on his back.

## 2022-11-28 ENCOUNTER — Emergency Department (HOSPITAL_BASED_OUTPATIENT_CLINIC_OR_DEPARTMENT_OTHER)
Admission: EM | Admit: 2022-11-28 | Discharge: 2022-11-28 | Disposition: A | Discharge status: Home / Self Care | Payer: 59 | Attending: Emergency Medicine | Admitting: Emergency Medicine

## 2022-11-28 ENCOUNTER — Other Ambulatory Visit: Payer: Self-pay

## 2022-11-28 ENCOUNTER — Encounter (HOSPITAL_BASED_OUTPATIENT_CLINIC_OR_DEPARTMENT_OTHER): Payer: Self-pay | Admitting: Pediatrics

## 2022-11-28 DIAGNOSIS — M62830 Muscle spasm of back: Secondary | ICD-10-CM | POA: Insufficient documentation

## 2022-11-28 DIAGNOSIS — Z79899 Other long term (current) drug therapy: Secondary | ICD-10-CM | POA: Insufficient documentation

## 2022-11-28 DIAGNOSIS — M545 Low back pain, unspecified: Secondary | ICD-10-CM | POA: Diagnosis not present

## 2022-11-28 DIAGNOSIS — I10 Essential (primary) hypertension: Secondary | ICD-10-CM | POA: Insufficient documentation

## 2022-11-28 DIAGNOSIS — Z8616 Personal history of COVID-19: Secondary | ICD-10-CM | POA: Diagnosis not present

## 2022-11-28 MED ORDER — METHYLPREDNISOLONE 4 MG PO TBPK: ORAL_TABLET | Freq: Once | ORAL | 0 refills | Status: AC

## 2022-11-28 MED ORDER — LIDOCAINE 5 % EX PTCH: 1.0000 | MEDICATED_PATCH | CUTANEOUS | 0 refills | Status: DC

## 2022-11-28 MED ORDER — METHOCARBAMOL 500 MG PO TABS: 500.0000 mg | ORAL_TABLET | Freq: Two times a day (BID) | ORAL | 0 refills | Status: DC

## 2022-11-28 MED ORDER — KETOROLAC TROMETHAMINE 60 MG/2ML IM SOLN
30.0000 mg | Freq: Once | INTRAMUSCULAR | Status: AC
Start: 2022-11-28 — End: 2022-11-28
  Administered 2022-11-28: 30 mg via INTRAMUSCULAR
  Filled 2022-11-28: qty 2

## 2022-11-28 NOTE — ED Provider Notes (Signed)
[place] [facility] Provider Note   CSN: [id number] Arrival date & time: 11/28/22  1418     History  Chief Complaint  Patient presents with   Back Pain    Brandon Snow is a 62 y.o. male with PMH as listed below who presents with left lower back pain that started today as he was bending over to pick something up off the ground. Severe pain felt like a "charlie horse" in his left lower back. Pain has improved now but the area is still sore. No midline pain, trauma, falls, h/o malignancy or IVDU, numbness/tingling, LE weakness. No recent illnesses, f/c, new medications. Does have left sciatic anyway which she does feel now and that is about the same, what is different is the left lower back pain.  He denies any urinary symptoms, abdominal pain, diarrhea constipation.  Tried Tylenol which did not help very much.   Past Medical History:  Diagnosis Date   COVID-19 2021   Hypertension        Home Medications Prior to Admission medications   Medication Sig Start Date End Date Taking? Authorizing Provider  lidocaine (LIDODERM) 5 % Place 1 patch onto the skin daily. Remove & Discard patch within 12 hours or as directed by MD 11/28/22  Yes Loetta Rough, MD  methocarbamol (ROBAXIN) 500 MG tablet Take 1 tablet (500 mg total) by mouth 2 (two) times daily. 11/28/22  Yes Loetta Rough, MD  methylPREDNISolone (MEDROL DOSEPAK) 4 MG TBPK tablet Take by mouth once for 1 dose. Please take according to instructions on the box. 11/28/22 11/28/22 Yes Loetta Rough, MD  Ascorbic Acid (VITAMIN C) 1000 MG tablet Take 1,000 mg by mouth daily.    [provider]  cyclobenzaprine (FLEXERIL) 10 MG tablet Take 1 tablet (10 mg total) by mouth 3 (three) times daily as needed for muscle spasms. 02/05/22   Smoot, Sarah A, PA-C  escitalopram (LEXAPRO) 10 MG tablet TAKE 1 TABLET BY MOUTH EVERY DAY 01/15/20   Ivonne Andrew, NP  gabapentin (NEURONTIN) 100 MG  capsule Take 1 capsule (100 mg total) by mouth 2 (two) times daily. 06/16/20   Carlean Jews, NP  hydrOXYzine (ATARAX/VISTARIL) 10 MG tablet Take 1 tablet (10 mg total) by mouth 3 (three) times daily as needed. 11/27/19   Ivonne Andrew, NP  meloxicam (MOBIC) 15 MG tablet Take 1 tablet (15 mg total) by mouth daily. 11/14/22   Sandre Kitty, MD  metoprolol tartrate (LOPRESSOR) 50 MG tablet Take 1 tablet (50 mg total) by mouth 2 (two) times daily. 11/14/19   Leroy Sea, MD  metroNIDAZOLE (FLAGYL) 500 MG tablet Take 1 tablet (500 mg total) by mouth 2 (two) times daily. 12/13/20   Rushie Chestnut, PA-C  Multiple Vitamin (MULTIVITAMIN) tablet Take 1 tablet by mouth daily.    [provider]  tetrahydrozoline-zinc (VISINE-AC) 0.05-0.25 % ophthalmic solution Place 1 drop into both eyes 3 (three) times daily as needed (dry eyes).    [provider]      Allergies    Patient has no known allergies.    Review of Systems   Review of Systems A 10 point review of systems was performed and is negative unless otherwise reported in HPI.  Physical Exam Updated Vital Signs BP (!) 145/79   Pulse 83   Temp 97.6 F (36.4 C) (Oral)   Resp 17   Ht 5\' 8"  (1.727 m)   Wt Marland Kitchen)  137 kg   SpO2 97%   BMI 45.92 kg/m  Physical Exam General: Normal appearing obese male, sitting in chair. HEENT: Sclera anicteric, MMM, trachea midline.  Cardiology: RRR Resp: Normal respiratory rate and effort. CTAB, no wheezes, rhonchi, crackles.  Abd: Soft, non-tender, non-distended. No rebound tenderness or guarding.  GU: Deferred. MSK: No peripheral edema or signs of trauma.  Skin: warm, dry.  Back: No CVA tenderness. L lower paraspinal lumbar muscle TTP. No midline T or L spine TTP.  Neuro: A&Ox4, CNs II-XII grossly intact. MAEs. Sensation grossly intact.  Psych: Normal mood and affect.   ED Results / Procedures / Treatments   Labs (all labs ordered are listed, but only abnormal results  are displayed) Labs Reviewed - No data to display  EKG None  Radiology No results found.  Procedures Procedures    Medications Ordered in ED Medications  ketorolac (TORADOL) injection 30 mg (has no administration in time range)    ED Course/ Medical Decision Making/ A&P                          Medical Decision Making   This patient presents to the ED for concern of LBP, this involves an extensive number of treatment options, and is a complaint that carries with it a high risk of complications and morbidity.  I considered the following differential and admission for this acute, potentially life threatening condition.   MDM:    DDX for low back pain includes but is not limited to:   Consider MSK pain as presenting etiology and back strain/sprain, muscle spasm, or sciatica. No back pain red flags on history or physical. Presentation not consistent with malignancy (lack of history of malignancy, lack of B symptoms), fracture (no trauma, no bony tenderness to palpation), cauda equina (no bowel or urinary incontinence/retention, no saddle anesthesia, no distal weakness), AAA, viscus perforation, osteomyelitis or epidural abscess (no IVDU, vertebral tenderness), renal colic, pyelonephritis (afebrile, no CVAT, no urinary symptoms). Given the clinical picture, no indication for imaging at this time.  Will prescribe robaxin, medrol dosepak for sciatica symptoms, lidocaine patch. Given toradol IM injection here and instructed to avoid NSAIDs for the next 6-8 hours at home. Tylenol, ibuprofen, heat, and massage also instructed. Pt states he starts physical therapy on Wednesday for sciatica.       Additional history obtained from chart review.    Social Determinants of Health: Lives independently  Disposition:  DC w/ discharge instructions/return precautions. All questions answered to patient's satisfaction.    Co morbidities that complicate the patient evaluation  Past Medical  History:  Diagnosis Date   COVID-19 2021   Hypertension      Medicines Meds ordered this encounter  Medications   ketorolac (TORADOL) injection 30 mg   methocarbamol (ROBAXIN) 500 MG tablet    Sig: Take 1 tablet (500 mg total) by mouth 2 (two) times daily.    Dispense:  20 tablet    Refill:  0   methylPREDNISolone (MEDROL DOSEPAK) 4 MG TBPK tablet    Sig: Take by mouth once for 1 dose. Please take according to instructions on the box.    Dispense:  1 each    Refill:  0   lidocaine (LIDODERM) 5 %    Sig: Place 1 patch onto the skin daily. Remove & Discard patch within 12 hours or as directed by MD    Dispense:  30 patch    Refill:  0  I have reviewed the patients home medicines and have made adjustments as needed  Problem List / ED Course: Problem List Items Addressed This Visit   None Visit Diagnoses     Muscle spasm of back    -  Primary                   This note was created using dictation software, which may contain spelling or grammatical errors.    Loetta Rough, MD 11/28/22 [phone]

## 2022-11-28 NOTE — ED Triage Notes (Signed)
C/O lower back pain describes as spasm, "like a charlie horse in my back. Denies losing control of bowel and bladder fx

## 2022-11-28 NOTE — ED Notes (Signed)
Pt. Reports he had a severe back pain earlier in the L side to the middle of his back and into the L leg.  Pt. Reports he has issues with Sciatica.

## 2022-11-28 NOTE — Discharge Instructions (Signed)
Thank you for coming to [facility] Emergency Department. You were seen for likely a lower back muscle spasm or back strain. You were given a toradol shot in the ED, which is a strong NSAID. We have prescribed robaxin (a muscle relaxer), lidocaine patches, and a medrol dosepak. You can alternate taking Tylenol and ibuprofen as needed for pain. You can take 650mg  tylenol (acetaminophen) every 4-6 hours, and 600 mg ibuprofen 3 times a day, though do not take ibuprofen for the rest of the day today. Heat, massage, and stretching can also help. Please attend your appointment on Wednesday with your physical therapist.    Please follow up with your primary care provider within 1 week if symptoms worsen or do not improve.   Return to the ED if you develop any of the following: - Fever (100.4 F or 38 C) or chills at home that do not respond to over the counter medications - Weakness, numbness, or tingling in your extremities - Difficulty emptying bladder / urinary incontinence -Painful urination or blood in your urine - Fecal incontinence - Uncontrolled nausea/vomiting with inability to keep down liquids - Feeling as though you are going to pass out or passing out - Anything else that concerns you

## 2022-12-01 ENCOUNTER — Telehealth: Payer: Self-pay | Admitting: *Deleted

## 2022-12-01 ENCOUNTER — Ambulatory Visit: Payer: 59 | Attending: Family Medicine

## 2022-12-01 ENCOUNTER — Other Ambulatory Visit: Payer: Self-pay

## 2022-12-01 DIAGNOSIS — R2689 Other abnormalities of gait and mobility: Secondary | ICD-10-CM | POA: Diagnosis not present

## 2022-12-01 DIAGNOSIS — M6281 Muscle weakness (generalized): Secondary | ICD-10-CM | POA: Diagnosis not present

## 2022-12-01 DIAGNOSIS — M5459 Other low back pain: Secondary | ICD-10-CM | POA: Diagnosis not present

## 2022-12-01 DIAGNOSIS — M545 Low back pain, unspecified: Secondary | ICD-10-CM | POA: Diagnosis not present

## 2022-12-01 DIAGNOSIS — M5442 Lumbago with sciatica, left side: Secondary | ICD-10-CM | POA: Insufficient documentation

## 2022-12-01 NOTE — Therapy (Signed)
OUTPATIENT PHYSICAL THERAPY THORACOLUMBAR EVALUATION   Patient Name: Brandon Snow MRN: [id number] DOB:[birth year], 62 y.o., male Today's Date: 12/01/2022  END OF SESSION:  PT End of Session - 12/01/22 1502     Visit Number 1    Number of Visits 12    Date for PT Re-Evaluation 01/12/23    Authorization Type [employer]    PT Start Time 1315    PT Stop Time 1355    PT Time Calculation (min) 40 min    Activity Tolerance Patient limited by pain             Past Medical History:  Diagnosis Date   COVID-19 2021   Hypertension    History reviewed. No pertinent surgical history. Patient Active Problem List   Diagnosis Date Noted   Class 3 severe obesity due to excess calories without serious comorbidity with body mass index (BMI) of 45.0 to 49.9 in adult [facility]) 11/15/2022   Encounter for general adult medical examination with abnormal findings 06/29/2020   Degenerative disc disease at L5-S1 level 06/29/2020   Local superficial swelling, mass or lump 06/29/2020   Elevated cholesterol 06/29/2020   Prediabetes 06/29/2020   Encounter to establish care 05/25/2020   Acute left-sided low back pain with left-sided sciatica 05/25/2020   History of COVID-19 05/25/2020   Anxiety 12/31/2019   Generalized anxiety disorder 11/26/2019   Shortness of breath 11/26/2019   Physical deconditioning 11/18/2019   Sepsis (HCC) 10/31/2019   Acute respiratory failure with hypoxemia (HCC) 10/31/2019   Elevated transaminase level 10/31/2019   Essential hypertension 10/31/2019   Pneumonia due to COVID-19 virus 10/30/2019   Right knee pain 11/26/2010    PCP: Sandre Kitty, MD  REFERRING PROVIDER: Sandre Kitty, MD  REFERRING DIAG: [phone] (ICD-10-CM) - Acute left-sided low back pain with left-sided sciatica   Rationale for Evaluation and Treatment: Rehabilitation  THERAPY DIAG:  Other low back pain - Plan: PT plan of care cert/re-cert  Muscle weakness (generalized) - Plan: PT plan of  care cert/re-cert  Other abnormalities of gait and mobility - Plan: PT plan of care cert/re-cert  ONSET DATE: Chronic - worse in last few weeks  SUBJECTIVE:                                                                                                                                                                                           SUBJECTIVE STATEMENT: Pt arrives to PT with reports of excruciating LBP with referral into L LE. Is hesitant to perform PT evaluation due to pain. Denies bowel/bladder changes or saddle anesthesia. Pain is chronic but has been worsening  over the last two weeks. He went to ED on 10/28 and was prescribed steroids and muscle relaxer, but this had not had an effect on pain. He bent over to pick something up on 10/28 and notes that his L side of his lower back "seized up" and it has been even more painful since. Pt states he needs stronger medication due to severe pain and limited mobility.   PERTINENT HISTORY:  HTN  PAIN:  Are you having pain?  Yes: NPRS scale: 10/10 Worst: 10/10 Pain location: L low back, L LE Pain description: sharp, numb Aggravating factors: bending forward, movement Relieving factors: heat  PRECAUTIONS: None  RED FLAGS: None   WEIGHT BEARING RESTRICTIONS: No  FALLS:  Has patient fallen in last 6 months? No  LIVING ENVIRONMENT: Lives with: lives alone Lives in: House/apartment Stairs: one flight - R handrail Has following equipment at home: None  OCCUPATION: Owns car detailing business   PLOF: Independent  PATIENT GOALS: decrease back pain, be able to move around a little better  NEXT MD VISIT: 01/16/2023  OBJECTIVE:  Note: Objective measures were completed at Evaluation unless otherwise noted.  DIAGNOSTIC FINDINGS:  None recently  PATIENT SURVEYS:  FOTO: 39% function; 68% predicted  COGNITION: Overall cognitive status: Within functional limits for tasks assessed     SENSATION: Light touch: Impaired - L  anterior thigh  MUSCLE LENGTH: Hamstrings: Right DNT; Left DNT Maisie Fus test: Right DNT; Left DNT  POSTURE: rounded shoulders, forward head, and increased lumbar lordosis  PALPATION: TTP to L lumbar paraspinals, L gluteals   LUMBAR ROM:   AROM eval  Flexion 25% reduced  Extension 75% reduced  Right lateral flexion   Left lateral flexion   Right rotation   Left rotation    (Blank rows = not tested)  LOWER EXTREMITY MMT:    MMT Right eval Left eval  Hip flexion 3/5 3/5  Hip extension 3/5 3/5  Hip abduction 3/5 3/5  Hip adduction    Hip internal rotation    Hip external rotation    Knee flexion    Knee extension    Ankle dorsiflexion    Ankle plantarflexion    Ankle inversion    Ankle eversion     (Blank rows = not tested)  LUMBAR SPECIAL TESTS:  Slump test: Negative - unable to perform supine assessment due to severe pain  FUNCTIONAL TESTS:  30 Second Sit to Stand: 2 rep with UE - unable to do more due to severe L LE pain  GAIT: Distance walked: 70ft Assistive device utilized: None Level of assistance: Complete Independence Comments: antalgic gait to L, trunk flexed  TREATMENT: [facility] Adult PT Treatment:                                                DATE: 12/01/2022 Therapeutic Exercise: Seated lumbar flexion with physioball x 10  Seated clamshell x 15 GTB Seated ball squeeze x 10 - 5" hold  PATIENT EDUCATION:  Education details: eval findings, FOTO, HEP, POC Person educated: Patient Education method: Explanation, Demonstration, and Handouts Education comprehension: verbalized understanding and returned demonstration  HOME EXERCISE PROGRAM: Access Code: [email or web address] URL: [email or web address] Date: 12/01/2022 Prepared by: Edwinna Areola  Exercises - Seated Lumbar Flexion Stretch  - 1 x daily - 7 x weekly - 2 sets - 10 reps - 5 sec hold -  Seated Hip Abduction with Resistance  - 1 x daily - 7 x weekly - 3 sets - 15 reps - green band hold -  Seated Hip Adduction Isometrics with Ball  - 1 x daily - 7 x weekly - 3 sets - 10 reps - 5 sec hold  ASSESSMENT:  CLINICAL IMPRESSION: Patient is a 62 y.o. M who was seen today for physical therapy evaluation and treatment for acute on chronic LBP with referral into L LE. Physical findings are consistent with MD impression as pt demonstrates decrease in core/hip strength and functional mobility. Evaluation limited to sitting/standing as pt could not tolerate supine positioning. FOTO score shows decrease in subjective functional ability below PLOF. PT would benefit from skilled PT services working on decreasing pain and improving comfort.    OBJECTIVE IMPAIRMENTS: Abnormal gait, decreased activity tolerance, decreased balance, decreased endurance, decreased mobility, difficulty walking, decreased ROM, decreased strength, improper body mechanics, postural dysfunction, and pain.   ACTIVITY LIMITATIONS: carrying, lifting, bending, sitting, standing, squatting, stairs, transfers, and locomotion level  PARTICIPATION LIMITATIONS: meal prep, cleaning, driving, shopping, community activity, occupation, and yard work  PERSONAL FACTORS: Time since onset of injury/illness/exacerbation and 1-2 comorbidities: HTN  are also affecting patient's functional outcome.   REHAB POTENTIAL: Good  CLINICAL DECISION MAKING: Evolving/moderate complexity  EVALUATION COMPLEXITY: Moderate   GOALS: Goals reviewed with patient? No  SHORT TERM GOALS: Target date: 12/22/2022   Pt will be compliant and knowledgeable with initial HEP for improved comfort and carryover Baseline: initial HEP given  Goal status: INITIAL  2.  Pt will self report low back and L LE pain no greater than 7/10 for improved comfort and functional ability Baseline: 10/10 at worst Goal status: INITIAL   LONG TERM GOALS: Target date: 01/26/2023   Pt will improve FOTO function score to no less than 68% as proxy for functional  improvement Baseline: 39% function Goal status: INITIAL   2.  Pt will self report low back and L LE pain no greater than 3/10 for improved comfort and functional ability Baseline: 10/10 at worst Goal status: INITIAL   3.  Pt will increase 30 Second Sit to Stand rep count to no less than 4 reps for improved balance, strength, and functional mobility Baseline: 2 reps with UE Goal status: INITIAL   4.  Pt will improve bilateral hip MMT to no less than 4/5 for improved functional mobility and decrease in pain Baseline: see chart Goal status: INITIAL   PLAN:  PT FREQUENCY: 1-2x/week  PT DURATION: 6 weeks  PLANNED INTERVENTIONS: 97164- PT Re-evaluation, 97110-Therapeutic exercises, 97530- Therapeutic activity, 97112- Neuromuscular re-education, 97535- Self Care, 69629- Manual therapy, [id number]- Gait training, [id number]- Aquatic Therapy, 97014- Electrical stimulation (unattended), [id number]- Electrical stimulation (manual), Dry Needling, Cryotherapy, and Moist heat  PLAN FOR NEXT SESSION: assess HEP response, supine assessment, core/hip strengthening   Eloy End, PT 12/01/2022, 3:03 PM

## 2022-12-01 NOTE — Telephone Encounter (Signed)
Pt calling to say that he went to PT today and could not complete due to being in sever pain.  He said he wanted to see if provider could send in something for pain stronger that what he is currently taking.  He mentioned going to [facility] and said as soon as he got off the phone he was going to head that way.  I told him that I would send the message to provider but he would probably not send anything in if anything until he sees what the [facility] was going to do.

## 2022-12-02 ENCOUNTER — Telehealth: Payer: Self-pay | Admitting: *Deleted

## 2022-12-02 NOTE — Telephone Encounter (Signed)
Yes, please let him know to update [place] after he has been to the orthopedic urgent care

## 2022-12-02 NOTE — Telephone Encounter (Signed)
Lvm for pt to call office to give him the below message and to see if he has any updates from his visit to [facility].

## 2022-12-07 ENCOUNTER — Encounter (HOSPITAL_COMMUNITY): Payer: Self-pay | Admitting: Emergency Medicine

## 2022-12-07 ENCOUNTER — Ambulatory Visit (HOSPITAL_COMMUNITY)
Admission: EM | Admit: 2022-12-07 | Discharge: 2022-12-07 | Disposition: A | Discharge status: Home / Self Care | Payer: 59 | Attending: Emergency Medicine | Admitting: Emergency Medicine

## 2022-12-07 DIAGNOSIS — G8929 Other chronic pain: Secondary | ICD-10-CM | POA: Diagnosis not present

## 2022-12-07 DIAGNOSIS — M5442 Lumbago with sciatica, left side: Secondary | ICD-10-CM

## 2022-12-07 MED ORDER — BACLOFEN 5 MG PO TABS: 5.0000 mg | ORAL_TABLET | Freq: Two times a day (BID) | ORAL | 0 refills | Status: DC | PRN

## 2022-12-07 NOTE — ED Triage Notes (Signed)
Pt c/o left leg pain and numbness. Pain starts at hip and runs down upper leg. Pt states he was recently dx with sciatic pain.

## 2022-12-07 NOTE — Discharge Instructions (Signed)
STOP taking the methocarbamol (Robaxin)  START taking the baclofen. This is a different muscle relaxer. Can be taken twice daily. If it makes you drowsy take only once before bedtime  CONTINUE daily meloxicam (mobic). Only once a day. Do not use ibuprofen/Advil or naproxen/Aleve while using this medication.  Go to [facility] tomorrow for follow up

## 2022-12-07 NOTE — ED Provider Notes (Signed)
[facility]    CSN: [id number] Arrival date & time: 12/07/22  1812     History   Chief Complaint Chief Complaint  Patient presents with   Back Pain   Leg Pain    HPI Brandon Snow is a 62 y.o. male.  Here with left low back and leg pain. Starts at left hip/low back. Not having weakness or numbness. Every now and then has tingling.  He has chronic back pain and sciatica. DDD at L5-S1  Seen in ED 10/6, given prednisone Seen by PCP 10/14, started meloxicam Seen again in ED 10/28, given toradol IM, another steroid pack, and robaxin He then [facility] and was given tramadol None of the medications have helped   No injury or trauma. Not having bowel/bladder dysfunction  He has MRI scheduled for this month   Past Medical History:  Diagnosis Date   COVID-19 2021   Hypertension     Patient Active Problem List   Diagnosis Date Noted   Class 3 severe obesity due to excess calories without serious comorbidity with body mass index (BMI) of 45.0 to 49.9 in adult [facility]) 11/15/2022   Encounter for general adult medical examination with abnormal findings 06/29/2020   Degenerative disc disease at L5-S1 level 06/29/2020   Local superficial swelling, mass or lump 06/29/2020   Elevated cholesterol 06/29/2020   Prediabetes 06/29/2020   Encounter to establish care 05/25/2020   Acute left-sided low back pain with left-sided sciatica 05/25/2020   History of COVID-19 05/25/2020   Anxiety 12/31/2019   Generalized anxiety disorder 11/26/2019   Shortness of breath 11/26/2019   Physical deconditioning 11/18/2019   Sepsis (HCC) 10/31/2019   Acute respiratory failure with hypoxemia (HCC) 10/31/2019   Elevated transaminase level 10/31/2019   Essential hypertension 10/31/2019   Pneumonia due to COVID-19 virus 10/30/2019   Right knee pain 11/26/2010    History reviewed. No pertinent surgical history.     Home Medications    Prior to Admission medications    Medication Sig Start Date End Date Taking? Authorizing Provider  Baclofen 5 MG TABS Take 1 tablet (5 mg total) by mouth 2 (two) times daily as needed. 12/07/22  Yes Kameren Pargas, Lurena Joiner, PA-C  Ascorbic Acid (VITAMIN C) 1000 MG tablet Take 1,000 mg by mouth daily.    [provider]  lidocaine (LIDODERM) 5 % Place 1 patch onto the skin daily. Remove & Discard patch within 12 hours or as directed by MD 11/28/22   Loetta Rough, MD  meloxicam (MOBIC) 15 MG tablet Take 1 tablet (15 mg total) by mouth daily. 11/14/22   Sandre Kitty, MD  Multiple Vitamin (MULTIVITAMIN) tablet Take 1 tablet by mouth daily.    [provider]  tetrahydrozoline-zinc (VISINE-AC) 0.05-0.25 % ophthalmic solution Place 1 drop into both eyes 3 (three) times daily as needed (dry eyes).    [provider]    Family History Family History  Problem Relation Age of Onset   Diabetes Mother    Hyperlipidemia Mother    Hyperlipidemia Father    Hypertension Father    Hypertension Sister    Hyperlipidemia Sister    Diabetes Sister    Diabetes Maternal Grandfather    Diabetes Paternal Grandmother    Hyperlipidemia Paternal Grandmother    Hypertension Paternal Grandmother    Sudden death Paternal Grandmother    Heart attack Neg Hx     Social History Social History   Tobacco Use  Smoking status: Some Days    Current packs/day: 0.50    Types: Cigarettes   Smokeless tobacco: Never  Vaping Use   Vaping status: Never Used  Substance Use Topics   Alcohol use: Yes    Comment: sometimes   Drug use: No     Allergies   Patient has no known allergies.   Review of Systems Review of Systems Per HPI  Physical Exam Triage Vital Signs ED Triage Vitals  Encounter Vitals Group     BP 12/07/22 1842 131/78     Systolic BP Percentile --      Diastolic BP Percentile --      Pulse Rate 12/07/22 1842 84     Resp 12/07/22 1842 18     Temp 12/07/22 1842 98 F (36.7 C)     Temp Source 12/07/22  1842 Oral     SpO2 12/07/22 1842 94 %     Weight --      Height --      Head Circumference --      Peak Flow --      Pain Score 12/07/22 1839 10     Pain Loc --      Pain Education --      Exclude from Growth Chart --    No data found.  Updated Vital Signs BP 131/78 (BP Location: Left Arm)   Pulse 84   Temp 98 F (36.7 C) (Oral)   Resp 18   SpO2 94%    Physical Exam Vitals and nursing note reviewed.  Constitutional:      General: He is not in acute distress.    Appearance: He is obese.  HENT:     Mouth/Throat:     Mouth: Mucous membranes are moist.     Pharynx: Oropharynx is clear.  Eyes:     Extraocular Movements: Extraocular movements intact.     Conjunctiva/sclera: Conjunctivae normal.     Pupils: Pupils are equal, round, and reactive to light.  Cardiovascular:     Rate and Rhythm: Normal rate and regular rhythm.     Heart sounds: Normal heart sounds.  Pulmonary:     Effort: Pulmonary effort is normal.     Breath sounds: Normal breath sounds.  Musculoskeletal:        General: Normal range of motion.     Cervical back: Normal range of motion. No rigidity or tenderness.     Comments: Muscular tenderness left lumbar paraspinals. No bony tenderness. No weakness of extremities. Can lift and extend legs from sitting. Strength 5/5. Sensation normal. No edema  Skin:    General: Skin is warm and dry.  Neurological:     General: No focal deficit present.     Mental Status: He is alert and oriented to person, place, and time.     Cranial Nerves: No cranial nerve deficit.     Sensory: Sensation is intact.     Motor: Motor function is intact. No weakness.     Coordination: Coordination is intact.     Gait: Gait is intact.     UC Treatments / Results  Labs (all labs ordered are listed, but only abnormal results are displayed) Labs Reviewed - No data to display  EKG  Radiology No results found.  Procedures Procedures (including critical care  time)  Medications Ordered in UC Medications - No data to display  Initial Impression / Assessment and Plan / UC Course  I have reviewed the triage vital signs and the  nursing notes.  Pertinent labs & imaging results that were available during my care of the patient were reviewed by me and considered in my medical decision making (see chart for details).  Chronic low back pain with sciatica Has tried multiple treatments over the last month, no improvement. MRI is scheduled for this month. As of now, no red flags.  Discussed limited management of chronic pain available in UC setting. Can try switching to baclofen instead of robaxin. Advised follow up with ortho. ED precautions Patient agreeable to plan  Final Clinical Impressions(s) / UC Diagnoses   Final diagnoses:  Chronic left-sided low back pain with left-sided sciatica     Discharge Instructions      STOP taking the methocarbamol (Robaxin)  START taking the baclofen. This is a different muscle relaxer. Can be taken twice daily. If it makes you drowsy take only once before bedtime  CONTINUE daily meloxicam (mobic). Only once a day. Do not use ibuprofen/Advil or naproxen/Aleve while using this medication.  Go to [facility] tomorrow for follow up     ED Prescriptions     Medication Sig Dispense Auth. Provider   Baclofen 5 MG TABS Take 1 tablet (5 mg total) by mouth 2 (two) times daily as needed. 20 tablet Avian Konigsberg, Lurena Joiner, PA-C      PDMP not reviewed this encounter.   Kathrine Haddock 12/07/22 1914

## 2022-12-11 ENCOUNTER — Other Ambulatory Visit: Payer: Self-pay | Admitting: Family Medicine

## 2022-12-22 DIAGNOSIS — M545 Low back pain, unspecified: Secondary | ICD-10-CM | POA: Diagnosis not present

## 2022-12-23 ENCOUNTER — Ambulatory Visit: Payer: 59 | Attending: Family Medicine | Admitting: Physical Therapy

## 2023-01-11 DIAGNOSIS — M545 Low back pain, unspecified: Secondary | ICD-10-CM | POA: Diagnosis not present

## 2023-01-11 DIAGNOSIS — M48062 Spinal stenosis, lumbar region with neurogenic claudication: Secondary | ICD-10-CM | POA: Diagnosis not present

## 2023-01-16 ENCOUNTER — Encounter: Payer: Self-pay | Admitting: Family Medicine

## 2023-01-16 ENCOUNTER — Ambulatory Visit: Payer: 59 | Admitting: Family Medicine

## 2023-01-16 VITALS — BP 127/76 | HR 87 | Ht 68.0 in | Wt 289.0 lb

## 2023-01-16 DIAGNOSIS — E66813 Obesity, class 3: Secondary | ICD-10-CM | POA: Diagnosis not present

## 2023-01-16 DIAGNOSIS — Z1212 Encounter for screening for malignant neoplasm of rectum: Secondary | ICD-10-CM | POA: Diagnosis not present

## 2023-01-16 DIAGNOSIS — Z6841 Body Mass Index (BMI) 40.0 and over, adult: Secondary | ICD-10-CM | POA: Diagnosis not present

## 2023-01-16 DIAGNOSIS — Z1211 Encounter for screening for malignant neoplasm of colon: Secondary | ICD-10-CM

## 2023-01-16 DIAGNOSIS — M5442 Lumbago with sciatica, left side: Secondary | ICD-10-CM

## 2023-01-16 MED ORDER — MELOXICAM 15 MG PO TABS: 15.0000 mg | ORAL_TABLET | Freq: Every day | ORAL | 1 refills | Status: DC

## 2023-01-16 MED ORDER — BACLOFEN 5 MG PO TABS: 5.0000 mg | ORAL_TABLET | Freq: Every evening | ORAL | 0 refills | Status: DC | PRN

## 2023-01-16 NOTE — Assessment & Plan Note (Signed)
Patient seeing [facility].  Was told he had "spinal stenosis" was prescribed steroids but feels like this is not helping provide meloxicam and baclofen did.  Will send in new prescription for baclofen and meloxicam.  Advised patient on side effects of daily NSAID use.  Advised to use baclofen only at nighttime.  Patient is going to physical therapy, starting tomorrow.  Follow-up after his next Ortho appointment in 2 months.

## 2023-01-16 NOTE — Progress Notes (Signed)
   Acute Office Visit  Subjective:     Patient ID: Brandon Snow, male    DOB: 1960/11/27, 62 y.o.   MRN: [id number]  Chief Complaint  Patient presents with   Hyperlipidemia    HPI Patient is in today for follow-up of chronic issues  Patient has not had repeat of his cholesterol since our last visit.  He states he has lost about 10 pounds by reducing how much food he eats.  No specific foods that he cuts out other than junk food.  Has not been able to exercise as much recently due to low back pain.  Patient would like a colonoscopy referral.  No questions regarding colonoscopies advised him someone will contact him to schedule it.  Patient has been going to [facility] for low back pain/sciatica.  States he was told he had some spinal stenosis.  He had been getting meloxicam and muscle relaxant from some of his ED visits.  When he went to the orthopedist he was prescribed oral steroids.  He feels like the meloxicam helped more than the oral steroids.  He has his first physical therapy appointment today.  Was told he would need to do aquatic therapy as well but does not have the order for this from his orthopedist.  He has follow-up with the orthopedist in 2 months.  The 10-year ASCVD risk score (Arnett DK, et al., 2019) is: 13.1%   ROS      Objective:    BP 127/76   Pulse 87   Ht 5\' 8"  (1.727 m)   Wt 289 lb (131.1 kg)   SpO2 97%   BMI 43.94 kg/m    Physical Exam General: Alert, oriented CV: Regular rate rhythm no murmurs Pulmonary: Lungs clear bilaterally no wheeze or crackles  No results found for any visits on 01/16/23.      Assessment & Plan:   Acute left-sided low back pain with left-sided sciatica Assessment & Plan: Patient seeing [facility].  Was told he had "spinal stenosis" was prescribed steroids but feels like this is not helping provide meloxicam and baclofen did.  Will send in new prescription for baclofen and meloxicam.  Advised patient on side  effects of daily NSAID use.  Advised to use baclofen only at nighttime.  Patient is going to physical therapy, starting tomorrow.  Follow-up after his next Ortho appointment in 2 months.   Encounter for colorectal cancer screening -     Ambulatory referral to Gastroenterology  Class 3 severe obesity due to excess calories without serious comorbidity with body mass index (BMI) of 45.0 to 49.9 in adult [facility]) Assessment & Plan: Patient has lost about 10 pounds since our last visit.  We have just been reducing portion size, reducing snacking.  Will get cholesterol level repeated along with CMP and A1c.  A1c may be artificially elevated due to recent steroid use.   Other orders -     Baclofen; Take 1 tablet (5 mg total) by mouth at bedtime as needed.  Dispense: 20 tablet; Refill: 0 -     Meloxicam; Take 1 tablet (15 mg total) by mouth daily.  Dispense: 30 tablet; Refill: 1     Return in about 3 months (around 04/16/2023) for hld, HTN.  Sandre Kitty, MD

## 2023-01-16 NOTE — Patient Instructions (Addendum)
It was nice to see you today,  We addressed the following topics today: -I have sent in a prescription for meloxicam and your baclofen.  Take the baclofen only at night as needed.  Take meloxicam during the day but try not to take it every day for more than 2 weeks in a row without taking a break.  You can alternate between taking meloxicam every other day and on the days you take meloxicam you can take Tylenol, but not ibuprofen or Aleve. - Someone should call you to schedule a colonoscopy in the next 2 weeks.  If you have not heard from them call us and let us know - Please schedule a lab visit for sometime this week or next.  Have a great day,  Frederic Jericho, MD

## 2023-01-16 NOTE — Assessment & Plan Note (Signed)
Patient has lost about 10 pounds since our last visit.  We have just been reducing portion size, reducing snacking.  Will get cholesterol level repeated along with CMP and A1c.  A1c may be artificially elevated due to recent steroid use.

## 2023-01-17 ENCOUNTER — Ambulatory Visit: Payer: 59 | Attending: Family Medicine

## 2023-01-17 DIAGNOSIS — M5459 Other low back pain: Secondary | ICD-10-CM | POA: Insufficient documentation

## 2023-01-17 DIAGNOSIS — M6281 Muscle weakness (generalized): Secondary | ICD-10-CM | POA: Insufficient documentation

## 2023-01-17 DIAGNOSIS — R2689 Other abnormalities of gait and mobility: Secondary | ICD-10-CM | POA: Insufficient documentation

## 2023-01-17 NOTE — Therapy (Signed)
OUTPATIENT PHYSICAL THERAPY TREATMENT   Patient Name: Brandon Snow MRN: [id number] DOB:Sep 21, 1960, 62 y.o., male Today's Date: 01/17/2023  END OF SESSION:  PT End of Session - 01/17/23 1533     Visit Number 2    Number of Visits 12    Date for PT Re-Evaluation 02/14/23    Authorization Type [employer]    PT Start Time 1533    PT Stop Time 1612    PT Time Calculation (min) 39 min    Activity Tolerance Patient limited by pain              Past Medical History:  Diagnosis Date   COVID-19 2021   Hypertension    History reviewed. No pertinent surgical history. Patient Active Problem List   Diagnosis Date Noted   Class 3 severe obesity due to excess calories without serious comorbidity with body mass index (BMI) of 45.0 to 49.9 in adult [facility]) 11/15/2022   Encounter for general adult medical examination with abnormal findings 06/29/2020   Degenerative disc disease at L5-S1 level 06/29/2020   Local superficial swelling, mass or lump 06/29/2020   Elevated cholesterol 06/29/2020   Prediabetes 06/29/2020   Encounter to establish care 05/25/2020   Acute left-sided low back pain with left-sided sciatica 05/25/2020   History of COVID-19 05/25/2020   Anxiety 12/31/2019   Generalized anxiety disorder 11/26/2019   Shortness of breath 11/26/2019   Physical deconditioning 11/18/2019   Sepsis (HCC) 10/31/2019   Acute respiratory failure with hypoxemia (HCC) 10/31/2019   Elevated transaminase level 10/31/2019   Essential hypertension 10/31/2019   Pneumonia due to COVID-19 virus 10/30/2019   Right knee pain 11/26/2010    PCP: Sandre Kitty, MD  REFERRING PROVIDER: Sandre Kitty, MD  REFERRING DIAG: [phone] (ICD-10-CM) - Acute left-sided low back pain with left-sided sciatica   Rationale for Evaluation and Treatment: Rehabilitation  THERAPY DIAG:  Other low back pain - Plan: PT plan of care cert/re-cert  Muscle weakness (generalized) - Plan: PT plan of care  cert/re-cert  Other abnormalities of gait and mobility - Plan: PT plan of care cert/re-cert  ONSET DATE: Chronic - worse in last few weeks  SUBJECTIVE:                                                                                                                                                                                           SUBJECTIVE STATEMENT: Pt presents to PT with reports of lower back and L LE pain. Did not get chance to perform HEP from evaluation session.   EVAL: Pt arrives to PT with reports of excruciating LBP with  referral into L LE. Is hesitant to perform PT evaluation due to pain. Denies bowel/bladder changes or saddle anesthesia. Pain is chronic but has been worsening over the last two weeks. He went to ED on 10/28 and was prescribed steroids and muscle relaxer, but this had not had an effect on pain. He bent over to pick something up on 10/28 and notes that his L side of his lower back "seized up" and it has been even more painful since. Pt states he needs stronger medication due to severe pain and limited mobility.   PERTINENT HISTORY:  HTN  PAIN:  Are you having pain?  Yes: NPRS scale: 10/10 Worst: 10/10 Pain location: L low back, L LE Pain description: sharp, numb Aggravating factors: bending forward, movement Relieving factors: heat  PRECAUTIONS: None  RED FLAGS: None   WEIGHT BEARING RESTRICTIONS: No  FALLS:  Has patient fallen in last 6 months? No  LIVING ENVIRONMENT: Lives with: lives alone Lives in: House/apartment Stairs: one flight - R handrail Has following equipment at home: None  OCCUPATION: Owns car detailing business   PLOF: Independent  PATIENT GOALS: decrease back pain, be able to move around a little better  NEXT MD VISIT: 01/16/2023  OBJECTIVE:  Note: Objective measures were completed at Evaluation unless otherwise noted.  DIAGNOSTIC FINDINGS:  None recently  PATIENT SURVEYS:  FOTO: 39% function; 68%  predicted  COGNITION: Overall cognitive status: Within functional limits for tasks assessed     SENSATION: Light touch: Impaired - L anterior thigh  MUSCLE LENGTH: Hamstrings: Right DNT; Left DNT Maisie Fus test: Right DNT; Left DNT  POSTURE: rounded shoulders, forward head, and increased lumbar lordosis  PALPATION: TTP to L lumbar paraspinals, L gluteals   LUMBAR ROM:   AROM eval  Flexion 25% reduced  Extension 75% reduced  Right lateral flexion   Left lateral flexion   Right rotation   Left rotation    (Blank rows = not tested)  LOWER EXTREMITY MMT:    MMT Right eval Left eval  Hip flexion 3/5 3/5  Hip extension 3/5 3/5  Hip abduction 3/5 3/5  Hip adduction    Hip internal rotation    Hip external rotation    Knee flexion    Knee extension    Ankle dorsiflexion    Ankle plantarflexion    Ankle inversion    Ankle eversion     (Blank rows = not tested)  LUMBAR SPECIAL TESTS:  Slump test: Negative - unable to perform supine assessment due to severe pain  FUNCTIONAL TESTS:  30 Second Sit to Stand: 2 rep with UE - unable to do more due to severe L LE pain  GAIT: Distance walked: 84ft Assistive device utilized: None Level of assistance: Complete Independence Comments: antalgic gait to L, trunk flexed  TREATMENT: [facility] Adult PT Treatment:                                                DATE: 01/17/2023 Therapeutic Exercise: NuStep lvl 5 UE/LE x 4 min while taking subjective LTR 2x10 Supine ball squeeze 2x10 - 3" hold Supine clamshell 2x15 GTB Seated sciatic nerve glide 2x10 L Seated lumbar flexion/lateral with physioball x 10 each Pilates LAQ 2x10 3# Supine pilates SLR x 10 each - L painful Modified thomas stretch x 30" each Row 2x10 17# Pallof press x 10 7#  [facility] Adult PT Treatment:                                                DATE: 12/01/2022 Therapeutic Exercise: Seated lumbar flexion with physioball x 10  Seated clamshell x 15 GTB Seated ball  squeeze x 10 - 5" hold  PATIENT EDUCATION:  Education details: continue HEP, aquatic therapy Person educated: Patient Education method: Explanation, Demonstration, and Handouts Education comprehension: verbalized understanding and returned demonstration  HOME EXERCISE PROGRAM: Access Code: [email or web address] URL: [email or web address] Date: 12/01/2022 Prepared by: Edwinna Areola  Exercises - Seated Lumbar Flexion Stretch  - 1 x daily - 7 x weekly - 2 sets - 10 reps - 5 sec hold - Seated Hip Abduction with Resistance  - 1 x daily - 7 x weekly - 3 sets - 15 reps - green band hold - Seated Hip Adduction Isometrics with Ball  - 1 x daily - 7 x weekly - 3 sets - 10 reps - 5 sec hold  ASSESSMENT:  CLINICAL IMPRESSION: Pt was able to complete prescribed exercises but was limited in standing activity secondary to increased pain and fatigue. Does seem to respond well to flexion-based exercises with noted decrease in discomfort. Overall he continues to operate well below PLOF, will continue to work in both aquatic and gym environment.   EVAL: Patient is a 62 y.o. M who was seen today for physical therapy evaluation and treatment for acute on chronic LBP with referral into L LE. Physical findings are consistent with MD impression as pt demonstrates decrease in core/hip strength and functional mobility. Evaluation limited to sitting/standing as pt could not tolerate supine positioning. FOTO score shows decrease in subjective functional ability below PLOF. PT would benefit from skilled PT services working on decreasing pain and improving comfort.    OBJECTIVE IMPAIRMENTS: Abnormal gait, decreased activity tolerance, decreased balance, decreased endurance, decreased mobility, difficulty walking, decreased ROM, decreased strength, improper body mechanics, postural dysfunction, and pain.   ACTIVITY LIMITATIONS: carrying, lifting, bending, sitting, standing, squatting, stairs, transfers, and locomotion  level  PARTICIPATION LIMITATIONS: meal prep, cleaning, driving, shopping, community activity, occupation, and yard work  PERSONAL FACTORS: Time since onset of injury/illness/exacerbation and 1-2 comorbidities: HTN  are also affecting patient's functional outcome.   REHAB POTENTIAL: Good  CLINICAL DECISION MAKING: Evolving/moderate complexity  EVALUATION COMPLEXITY: Moderate   GOALS: Goals reviewed with patient? No  SHORT TERM GOALS: Target date: 12/22/2022   Pt will be compliant and knowledgeable with initial HEP for improved comfort and carryover Baseline: initial HEP given  Goal status: NOT MET  2.  Pt will self report low back and L LE pain no greater than 7/10 for improved comfort and functional ability Baseline: 10/10 at worst Goal status: IN PROGRESS   LONG TERM GOALS: Target date: 02/14/2023    Pt will improve FOTO function score to no less than 68% as proxy for functional improvement Baseline: 39% function Goal status: INITIAL   2.  Pt will self report low back and L LE pain no greater than 3/10 for improved comfort and functional ability Baseline: 10/10 at worst Goal status: INITIAL   3.  Pt will increase 30 Second Sit to Stand rep count to no less than 4 reps for improved balance, strength, and functional mobility Baseline: 2 reps with UE Goal status: INITIAL   4.  Pt will  improve bilateral hip MMT to no less than 4/5 for improved functional mobility and decrease in pain Baseline: see chart Goal status: INITIAL   PLAN:  PT FREQUENCY: 1-2x/week  PT DURATION: 6 weeks  PLANNED INTERVENTIONS: 97164- PT Re-evaluation, 97110-Therapeutic exercises, 97530- Therapeutic activity, 97112- Neuromuscular re-education, 97535- Self Care, 81191- Manual therapy, [id number]- Gait training, [id number]- Aquatic Therapy, 97014- Electrical stimulation (unattended), [id number]- Electrical stimulation (manual), Dry Needling, Cryotherapy, and Moist heat  PLAN FOR NEXT SESSION: assess HEP  response, supine assessment, core/hip strengthening   Eloy End, PT 01/17/2023, 4:30 PM

## 2023-01-18 ENCOUNTER — Ambulatory Visit: Payer: 59

## 2023-01-23 ENCOUNTER — Other Ambulatory Visit: Payer: 59

## 2023-01-24 ENCOUNTER — Ambulatory Visit: Payer: 59

## 2023-01-24 ENCOUNTER — Telehealth: Payer: Self-pay

## 2023-01-24 NOTE — Telephone Encounter (Signed)
PT called and spoke with patient who got his appointment days mixed up. Confirmed next appointment in the pool at [facility].  Brandon Snow   01/24/23 10:35 AM

## 2023-01-27 ENCOUNTER — Ambulatory Visit: Payer: 59

## 2023-01-27 DIAGNOSIS — M6281 Muscle weakness (generalized): Secondary | ICD-10-CM

## 2023-01-27 DIAGNOSIS — R2689 Other abnormalities of gait and mobility: Secondary | ICD-10-CM | POA: Diagnosis not present

## 2023-01-27 DIAGNOSIS — M5459 Other low back pain: Secondary | ICD-10-CM

## 2023-01-27 NOTE — Therapy (Addendum)
 OUTPATIENT PHYSICAL THERAPY TREATMENT NOTE/DISCHARGE  PHYSICAL THERAPY DISCHARGE SUMMARY  Visits from Start of Care: 3  Current functional level related to goals / functional outcomes: See goals/objective   Remaining deficits: Unable to assess   Education / Equipment: HEP   Patient agrees to discharge. Patient goals were unable to assess. Patient is being discharged due to not returning since the last visit.     Patient Name: Brandon Snow MRN: [id number] DOB:[birth year], 62 y.o., male Today's Date: 01/27/2023  END OF SESSION:  PT End of Session - 01/27/23 1329     Visit Number 3    Number of Visits 12    Date for PT Re-Evaluation 02/14/23    Authorization Type [employer]    PT Start Time 1329    PT Stop Time 1359    PT Time Calculation (min) 30 min               Past Medical History:  Diagnosis Date   COVID-19 2021   Hypertension    No past surgical history on file. Patient Active Problem List   Diagnosis Date Noted   Class 3 severe obesity due to excess calories without serious comorbidity with body mass index (BMI) of 45.0 to 49.9 in adult [facility]) 11/15/2022   Encounter for general adult medical examination with abnormal findings 06/29/2020   Degenerative disc disease at L5-S1 level 06/29/2020   Local superficial swelling, mass or lump 06/29/2020   Elevated cholesterol 06/29/2020   Prediabetes 06/29/2020   Encounter to establish care 05/25/2020   Acute left-sided low back pain with left-sided sciatica 05/25/2020   History of COVID-19 05/25/2020   Anxiety 12/31/2019   Generalized anxiety disorder 11/26/2019   Shortness of breath 11/26/2019   Physical deconditioning 11/18/2019   Sepsis (HCC) 10/31/2019   Acute respiratory failure with hypoxemia (HCC) 10/31/2019   Elevated transaminase level 10/31/2019   Essential hypertension 10/31/2019   Pneumonia due to COVID-19 virus 10/30/2019   Right knee pain 11/26/2010    PCP: Sandre Kitty,  MD  REFERRING PROVIDER: Sandre Kitty, MD  REFERRING DIAG: [phone] (ICD-10-CM) - Acute left-sided low back pain with left-sided sciatica   Rationale for Evaluation and Treatment: Rehabilitation  THERAPY DIAG:  Other low back pain  Muscle weakness (generalized)  Other abnormalities of gait and mobility  ONSET DATE: Chronic - worse in last few weeks  SUBJECTIVE:                                                                                                                                                                                           SUBJECTIVE STATEMENT: Patient  reports he has not been able to complete HEP yet, states his LLE and lower back are sore today. Was not prepared for the long walk from parking to the pool, states he will bring his walker next time.  EVAL: Pt arrives to PT with reports of excruciating LBP with referral into L LE. Is hesitant to perform PT evaluation due to pain. Denies bowel/bladder changes or saddle anesthesia. Pain is chronic but has been worsening over the last two weeks. He went to ED on 10/28 and was prescribed steroids and muscle relaxer, but this had not had an effect on pain. He bent over to pick something up on 10/28 and notes that his L side of his lower back "seized up" and it has been even more painful since. Pt states he needs stronger medication due to severe pain and limited mobility.   PERTINENT HISTORY:  HTN  PAIN:  Are you having pain?  Yes: NPRS scale: 10/10 Worst: 10/10 Pain location: L low back, L LE Pain description: sharp, numb Aggravating factors: bending forward, movement Relieving factors: heat  PRECAUTIONS: None  RED FLAGS: None   WEIGHT BEARING RESTRICTIONS: No  FALLS:  Has patient fallen in last 6 months? No  LIVING ENVIRONMENT: Lives with: lives alone Lives in: House/apartment Stairs: one flight - R handrail Has following equipment at home: None  OCCUPATION: Owns car detailing business   PLOF:  Independent  PATIENT GOALS: decrease back pain, be able to move around a little better  NEXT MD VISIT: 01/16/2023  OBJECTIVE:  Note: Objective measures were completed at Evaluation unless otherwise noted.  DIAGNOSTIC FINDINGS:  None recently  PATIENT SURVEYS:  FOTO: 39% function; 68% predicted  COGNITION: Overall cognitive status: Within functional limits for tasks assessed     SENSATION: Light touch: Impaired - L anterior thigh  MUSCLE LENGTH: Hamstrings: Right DNT; Left DNT Maisie Fus test: Right DNT; Left DNT  POSTURE: rounded shoulders, forward head, and increased lumbar lordosis  PALPATION: TTP to L lumbar paraspinals, L gluteals   LUMBAR ROM:   AROM eval  Flexion 25% reduced  Extension 75% reduced  Right lateral flexion   Left lateral flexion   Right rotation   Left rotation    (Blank rows = not tested)  LOWER EXTREMITY MMT:    MMT Right eval Left eval  Hip flexion 3/5 3/5  Hip extension 3/5 3/5  Hip abduction 3/5 3/5  Hip adduction    Hip internal rotation    Hip external rotation    Knee flexion    Knee extension    Ankle dorsiflexion    Ankle plantarflexion    Ankle inversion    Ankle eversion     (Blank rows = not tested)  LUMBAR SPECIAL TESTS:  Slump test: Negative - unable to perform supine assessment due to severe pain  FUNCTIONAL TESTS:  30 Second Sit to Stand: 2 rep with UE - unable to do more due to severe L LE pain  GAIT: Distance walked: 40ft Assistive device utilized: None Level of assistance: Complete Independence Comments: antalgic gait to L, trunk flexed  TREATMENT: [facility] Adult PT Treatment:                                                DATE: 01/27/23 Aquatic therapy at [facility]- [address] - therapeutic pool temp approximately 92 degrees. Pt  enters building ambulating without AD, needing rest breaks on the way back to the pool. Treatment took place in water 3.8 to  4 ft 8 in. deep depending upon activity.  Pt  entered and exited the pool via stair and handrails independently. Patient entered water for aquatic therapy for first time and was introduced to principles and therapeutic effects of water as they ambulated and acclimated to pool.  Therapeutic Exercise: Aquatic Exercise: Walking forward/side stepping Side stepping with yellow DB shoulder abd/add x2 laps Runners stretch on bottom step x30" BIL Hamstring stretch on bottom step x30" BIL Standing thoracic rotation with noodle x1' Cat/cow with noodle x20 Standing with UE support edge of pool: Hip abd/add x15 BIL Hip ext/flex with knee straight x 15 BIL Hip Circles CC/CCW x10 each BIL Marching hip flexion to knee extension x10 BIL Squats x20 Step ups on bottom step x10 LLE leading fwd  Pt requires the buoyancy of water for active assisted exercises with buoyancy supported for strengthening and AROM exercises. Hydrostatic pressure also supports joints by unweighting joint load by at least 50 % in 3-4 feet depth water. 80% in chest to neck deep water. Water will provide assistance with movement using the current and laminar flow while the buoyancy reduces weight bearing. Pt requires the viscosity of the water for resistance with strengthening exercises.   [facility] Adult PT Treatment:                                                DATE: 01/17/2023 Therapeutic Exercise: NuStep lvl 5 UE/LE x 4 min while taking subjective LTR 2x10 Supine ball squeeze 2x10 - 3" hold Supine clamshell 2x15 GTB Seated sciatic nerve glide 2x10 L Seated lumbar flexion/lateral with physioball x 10 each Pilates LAQ 2x10 3# Supine pilates SLR x 10 each - L painful Modified thomas stretch x 30" each Row 2x10 17# Pallof press x 10 7#  [facility] Adult PT Treatment:                                                DATE: 12/01/2022 Therapeutic Exercise: Seated lumbar flexion with physioball x 10  Seated clamshell x 15 GTB Seated ball squeeze x 10 - 5" hold  PATIENT EDUCATION:   Education details: continue HEP, aquatic therapy Person educated: Patient Education method: Explanation, Demonstration, and Handouts Education comprehension: verbalized understanding and returned demonstration  HOME EXERCISE PROGRAM: Access Code: [email or web address] URL: [email or web address] Date: 12/01/2022 Prepared by: Edwinna Areola  Exercises - Seated Lumbar Flexion Stretch  - 1 x daily - 7 x weekly - 2 sets - 10 reps - 5 sec hold - Seated Hip Abduction with Resistance  - 1 x daily - 7 x weekly - 3 sets - 15 reps - green band hold - Seated Hip Adduction Isometrics with Ball  - 1 x daily - 7 x weekly - 3 sets - 10 reps - 5 sec hold  ASSESSMENT:  CLINICAL IMPRESSION: Patient presents to first aquatic PT session 14 minutes late, partially due to long walk from parking to pool and needing rest breaks along the way. Session today focused on LE and core strengthening as well as lumbar mobility in the aquatic environment for use of buoyancy to  offload joints and the viscosity of water as resistance during therapeutic exercise. Patient was able to tolerate all prescribed exercises in the aquatic environment with no adverse effects. Patient continues to benefit from skilled PT services on land and aquatic based and should be progressed as able to improve functional independence.    EVAL: Patient is a 62 y.o. M who was seen today for physical therapy evaluation and treatment for acute on chronic LBP with referral into L LE. Physical findings are consistent with MD impression as pt demonstrates decrease in core/hip strength and functional mobility. Evaluation limited to sitting/standing as pt could not tolerate supine positioning. FOTO score shows decrease in subjective functional ability below PLOF. PT would benefit from skilled PT services working on decreasing pain and improving comfort.    OBJECTIVE IMPAIRMENTS: Abnormal gait, decreased activity tolerance, decreased balance, decreased  endurance, decreased mobility, difficulty walking, decreased ROM, decreased strength, improper body mechanics, postural dysfunction, and pain.   ACTIVITY LIMITATIONS: carrying, lifting, bending, sitting, standing, squatting, stairs, transfers, and locomotion level  PARTICIPATION LIMITATIONS: meal prep, cleaning, driving, shopping, community activity, occupation, and yard work  PERSONAL FACTORS: Time since onset of injury/illness/exacerbation and 1-2 comorbidities: HTN  are also affecting patient's functional outcome.   REHAB POTENTIAL: Good  CLINICAL DECISION MAKING: Evolving/moderate complexity  EVALUATION COMPLEXITY: Moderate   GOALS: Goals reviewed with patient? No  SHORT TERM GOALS: Target date: 12/22/2022   Pt will be compliant and knowledgeable with initial HEP for improved comfort and carryover Baseline: initial HEP given  Goal status: NOT MET  2.  Pt will self report low back and L LE pain no greater than 7/10 for improved comfort and functional ability Baseline: 10/10 at worst Goal status: IN PROGRESS   LONG TERM GOALS: Target date: 02/14/2023    Pt will improve FOTO function score to no less than 68% as proxy for functional improvement Baseline: 39% function Goal status: INITIAL   2.  Pt will self report low back and L LE pain no greater than 3/10 for improved comfort and functional ability Baseline: 10/10 at worst Goal status: INITIAL   3.  Pt will increase 30 Second Sit to Stand rep count to no less than 4 reps for improved balance, strength, and functional mobility Baseline: 2 reps with UE Goal status: INITIAL   4.  Pt will improve bilateral hip MMT to no less than 4/5 for improved functional mobility and decrease in pain Baseline: see chart Goal status: INITIAL   PLAN:  PT FREQUENCY: 1-2x/week  PT DURATION: 6 weeks  PLANNED INTERVENTIONS: 97164- PT Re-evaluation, 97110-Therapeutic exercises, 97530- Therapeutic activity, 97112- Neuromuscular  re-education, 97535- Self Care, 78469- Manual therapy, [id number]- Gait training, [id number]- Aquatic Therapy, 97014- Electrical stimulation (unattended), [id number]- Electrical stimulation (manual), Dry Needling, Cryotherapy, and Moist heat  PLAN FOR NEXT SESSION: assess HEP response, supine assessment, core/hip strengthening   Berta Minor, PTA 01/27/2023, 2:04 PM

## 2023-01-30 ENCOUNTER — Other Ambulatory Visit: Payer: 59

## 2023-01-31 ENCOUNTER — Ambulatory Visit: Payer: 59 | Admitting: Physical Therapy

## 2023-01-31 DIAGNOSIS — M48062 Spinal stenosis, lumbar region with neurogenic claudication: Secondary | ICD-10-CM | POA: Diagnosis not present

## 2023-02-03 ENCOUNTER — Ambulatory Visit: Payer: 59

## 2023-02-03 ENCOUNTER — Other Ambulatory Visit: Payer: Self-pay | Admitting: Family Medicine

## 2023-02-03 DIAGNOSIS — M545 Low back pain, unspecified: Secondary | ICD-10-CM | POA: Diagnosis not present

## 2023-02-07 ENCOUNTER — Ambulatory Visit: Payer: 59

## 2023-02-09 ENCOUNTER — Ambulatory Visit (INDEPENDENT_AMBULATORY_CARE_PROVIDER_SITE_OTHER): Payer: 59 | Admitting: Physical Medicine and Rehabilitation

## 2023-02-09 ENCOUNTER — Encounter: Payer: Self-pay | Admitting: Physical Medicine and Rehabilitation

## 2023-02-09 VITALS — BP 142/89 | HR 89

## 2023-02-09 DIAGNOSIS — G8929 Other chronic pain: Secondary | ICD-10-CM

## 2023-02-09 DIAGNOSIS — M48062 Spinal stenosis, lumbar region with neurogenic claudication: Secondary | ICD-10-CM

## 2023-02-09 DIAGNOSIS — M5442 Lumbago with sciatica, left side: Secondary | ICD-10-CM | POA: Diagnosis not present

## 2023-02-09 DIAGNOSIS — M5416 Radiculopathy, lumbar region: Secondary | ICD-10-CM | POA: Diagnosis not present

## 2023-02-09 MED ORDER — BACLOFEN 10 MG PO TABS
10.0000 mg | ORAL_TABLET | Freq: Every evening | ORAL | 0 refills | Status: DC | PRN
Start: 2023-02-09 — End: 2023-03-10

## 2023-02-09 MED ORDER — IBUPROFEN 800 MG PO TABS
800.0000 mg | ORAL_TABLET | Freq: Three times a day (TID) | ORAL | 0 refills | Status: DC | PRN
Start: 2023-02-09 — End: 2023-03-14

## 2023-02-09 NOTE — Progress Notes (Signed)
 Intense (8)    Cannot complete any ADLs without much assistance/cannot concentrate/conversation is difficult/unable to sleep and unable to use distraction. Severe range order  Average Pain  12  Patient states back pain on the left side that radiates down into the left leg.  Tingling in the left leg.  Hurts to walk, sit , and stand.  Taking Ibuprofen .

## 2023-02-09 NOTE — Progress Notes (Signed)
 [name] - 63 y.o. male MRN [id number]  Date of birth: 12-24-1960  Office Visit Note: Visit Date: 02/09/2023 PCP: Chandra Toribio POUR, MD Referred by: Chandra Toribio POUR, MD  Subjective: Chief Complaint  Patient presents with   Lower Back - Pain   HPI: Brandon Snow is a 63 y.o. male who comes in today as a self referral for evaluation of chronic, worsening and severe bilateral lower back pain radiating to left buttock, lateral hip and anterior thigh. Pain ongoing for 6-8 months. His pain becomes worse with standing and walking, states he has to lean over shopping cart at the store. States his left leg feels weak. He describes pain sore and aching, currently rates as 9 out of 10. Some relief of pain with home exercise regimen, rest and use of medications. He has tried Meloxicam  and Baclofen  with minimal relief of pain. Currently undergoing aquatic therapy at [facility] with some relief of pain. No history of lumbar surgery/injections. Recent lumbar MRI imaging shows multi level facet arthropathy and central canal stenosis. Severe central stenosis at L1-L2, moderate at L2-L3, L3-L4 and L4-L5. There are modic 2 endplate changes at L2-L3 and L3-L4 with left greater than right facet marrow edema with prominent periarticular soft tissue edema. He was recently evaluated by Dr. Donaciano Sprang with [facility], these notes can be reviewed further in care everywhere. Dr. Sprang felt patient was not surgical candidate at this time, recommended further pain management with medications and possible injections. Patient denies focal weakness, numbness and tingling. No recent trauma or falls.      Review of Systems  Musculoskeletal:  Positive for back pain.  Neurological:  Negative for tingling, sensory change, focal weakness and weakness.  All other systems reviewed and are negative.  Otherwise per HPI.  Assessment & Plan: Visit Diagnoses:    ICD-10-CM   1. Chronic bilateral low back pain with  left-sided sciatica  M54.42 Ambulatory referral to Physical Medicine Rehab   G89.29     2. Lumbar radiculopathy  M54.16 Ambulatory referral to Physical Medicine Rehab    3. Spinal stenosis of lumbar region with neurogenic claudication  M48.062 Ambulatory referral to Physical Medicine Rehab       Plan: Findings:  Chronic, worsening and severe bilateral lower back pain radiating to left buttock, lateral hip and anterior thigh.  Patient continues to have severe pain despite good conservative therapy such as home exercise regimen, rest and use of medications.  Patient did bring in CD and reports of recent lumbar MRI imaging that I reviewed today.  Patient's clinical presentation and exam are consistent with neurogenic claudication as a result of spinal canal stenosis.  There is multilevel central canal stenosis noted, his current pain pattern fits with more of an L3 distribution.  We discussed treatment plan in detail today, next step is to perform diagnostic and hopefully therapeutic left L3 transforaminal epidural steroid injection under fluoroscopic guidance.  If good relief of pain with injection we can repeat this procedure infrequently as needed.  Dr. Eldonna at bedside to discuss injection procedure, patient has no questions at this time.  If his pain presents more severe to lower back we could also look at facet joint injections in the future.  If his pain persist post injection would recommend follow-up with Dr. Sprang to further discuss surgical options.  No red flag symptoms noted upon exam today.    Meds & Orders:  Meds ordered this encounter  Medications   ibuprofen  (ADVIL ) 800  MG tablet    Sig: Take 1 tablet (800 mg total) by mouth every 8 (eight) hours as needed.    Dispense:  30 tablet    Refill:  0   baclofen  (LIORESAL ) 10 MG tablet    Sig: Take 1 tablet (10 mg total) by mouth at bedtime as needed for muscle spasms.    Dispense:  30 each    Refill:  0    Orders Placed This  Encounter  Procedures   Ambulatory referral to Physical Medicine Rehab    Follow-up: Return for Left L3 transforaminal epidural steroid injection.   Procedures: No procedures performed      Clinical History: No specialty comments available.   He reports that he has been smoking cigarettes. He has never used smokeless tobacco. No results for input(s): HGBA1C, LABURIC in the last 8760 hours.  Objective:  VS:  HT:    WT:   BMI:     BP:(!) 142/89  HR:89bpm  TEMP: ( )  RESP:  Physical Exam Vitals and nursing note reviewed.  HENT:     Head: Normocephalic and atraumatic.     Right Ear: External ear normal.     Left Ear: External ear normal.     Nose: Nose normal.     Mouth/Throat:     Mouth: Mucous membranes are moist.  Eyes:     Extraocular Movements: Extraocular movements intact.  Cardiovascular:     Rate and Rhythm: Normal rate.     Pulses: Normal pulses.  Pulmonary:     Effort: Pulmonary effort is normal.  Abdominal:     General: Abdomen is flat. There is no distension.  Musculoskeletal:        General: Tenderness present.     Cervical back: Normal range of motion.     Comments: Patient rises from seated position to standing without difficulty. Good lumbar range of motion. No pain noted with facet loading. 5/5 strength noted with bilateral hip flexion, knee flexion/extension, ankle dorsiflexion/plantarflexion and EHL. No clonus noted bilaterally. No pain upon palpation of greater trochanters. No pain with internal/external rotation of bilateral hips. Sensation intact bilaterally. Negative slump test bilaterally. Ambulates without aid, gait steady.     Skin:    General: Skin is warm and dry.     Capillary Refill: Capillary refill takes less than 2 seconds.  Neurological:     General: No focal deficit present.     Mental Status: He is alert and oriented to person, place, and time.  Psychiatric:        Mood and Affect: Mood normal.        Behavior: Behavior normal.      Ortho Exam  Imaging: No results found.  Past Medical/Family/Surgical/Social History: Medications & Allergies reviewed per EMR, new medications updated. Patient Active Problem List   Diagnosis Date Noted   Class 3 severe obesity due to excess calories without serious comorbidity with body mass index (BMI) of 45.0 to 49.9 in adult [facility]) 11/15/2022   Encounter for general adult medical examination with abnormal findings 06/29/2020   Degenerative disc disease at L5-S1 level 06/29/2020   Local superficial swelling, mass or lump 06/29/2020   Elevated cholesterol 06/29/2020   Prediabetes 06/29/2020   Encounter to establish care 05/25/2020   Acute left-sided low back pain with left-sided sciatica 05/25/2020   History of COVID-19 05/25/2020   Anxiety 12/31/2019   Generalized anxiety disorder 11/26/2019   Shortness of breath 11/26/2019   Physical deconditioning 11/18/2019   Sepsis (  HCC) 10/31/2019   Acute respiratory failure with hypoxemia (HCC) 10/31/2019   Elevated transaminase level 10/31/2019   Essential hypertension 10/31/2019   Pneumonia due to COVID-19 virus 10/30/2019   Right knee pain 11/26/2010   Past Medical History:  Diagnosis Date   COVID-19 2021   Hypertension    Family History  Problem Relation Age of Onset   Diabetes Mother    Hyperlipidemia Mother    Hyperlipidemia Father    Hypertension Father    Hypertension Sister    Hyperlipidemia Sister    Diabetes Sister    Diabetes Maternal Grandfather    Diabetes Paternal Grandmother    Hyperlipidemia Paternal Grandmother    Hypertension Paternal Grandmother    Sudden death Paternal Grandmother    Heart attack Neg Hx    History reviewed. No pertinent surgical history. Social History   Occupational History   Not on file  Tobacco Use   Smoking status: Some Days    Current packs/day: 0.50    Types: Cigarettes   Smokeless tobacco: Never  Vaping Use   Vaping status: Never Used  Substance and Sexual  Activity   Alcohol use: Yes    Comment: sometimes   Drug use: No   Sexual activity: Yes    Partners: Female

## 2023-02-10 ENCOUNTER — Ambulatory Visit: Payer: 59

## 2023-02-14 ENCOUNTER — Telehealth: Payer: Self-pay | Admitting: Physical Medicine and Rehabilitation

## 2023-02-14 NOTE — Telephone Encounter (Signed)
 Patient called and wants an appt for the Epidural shot in the back. CB#[phone]

## 2023-02-16 ENCOUNTER — Ambulatory Visit: Payer: 59 | Attending: Family Medicine | Admitting: Physical Therapy

## 2023-02-17 ENCOUNTER — Ambulatory Visit: Payer: 59

## 2023-02-17 ENCOUNTER — Telehealth: Payer: Self-pay | Admitting: Physical Medicine and Rehabilitation

## 2023-02-17 ENCOUNTER — Telehealth: Payer: Self-pay | Admitting: Physical Therapy

## 2023-02-17 NOTE — Telephone Encounter (Signed)
Patient called needing an earlier appointment because he's pain level is high. The number to contact patient is [phone]

## 2023-02-17 NOTE — Telephone Encounter (Signed)
Called and informed patient of missed visit and provided reminder of next appt and attendance policy. MAY ONLY SCHEDULE 1 visit at a time.

## 2023-02-22 ENCOUNTER — Ambulatory Visit: Payer: 59 | Admitting: Physical Therapy

## 2023-02-23 ENCOUNTER — Ambulatory Visit: Payer: 59 | Admitting: Physical Therapy

## 2023-02-24 ENCOUNTER — Ambulatory Visit: Payer: 59

## 2023-02-27 ENCOUNTER — Ambulatory Visit: Payer: 59

## 2023-03-02 ENCOUNTER — Encounter: Payer: 59 | Admitting: Physical Medicine and Rehabilitation

## 2023-03-10 ENCOUNTER — Other Ambulatory Visit: Payer: Self-pay | Admitting: Physical Medicine and Rehabilitation

## 2023-03-14 ENCOUNTER — Other Ambulatory Visit: Payer: Self-pay

## 2023-03-14 ENCOUNTER — Ambulatory Visit (INDEPENDENT_AMBULATORY_CARE_PROVIDER_SITE_OTHER): Payer: 59 | Admitting: Physical Medicine and Rehabilitation

## 2023-03-14 VITALS — BP 130/80 | HR 65

## 2023-03-14 DIAGNOSIS — M5416 Radiculopathy, lumbar region: Secondary | ICD-10-CM | POA: Diagnosis not present

## 2023-03-14 MED ORDER — IBUPROFEN 800 MG PO TABS: 800.0000 mg | ORAL_TABLET | Freq: Three times a day (TID) | ORAL | 0 refills | Status: DC | PRN

## 2023-03-14 MED ORDER — METHYLPREDNISOLONE ACETATE 40 MG/ML IJ SUSP
40.0000 mg | Freq: Once | INTRAMUSCULAR | Status: AC
  Administered 2023-03-14: 40 mg

## 2023-03-14 NOTE — Patient Instructions (Signed)

## 2023-03-14 NOTE — Progress Notes (Signed)
Pain scale--5 No allergies to Contrast Dyes No Blood thinners

## 2023-03-20 ENCOUNTER — Encounter: Payer: Self-pay | Admitting: Gastroenterology

## 2023-03-27 NOTE — Procedures (Signed)
 Lumbosacral Transforaminal Epidural Steroid Injection - Sub-Pedicular Approach with Fluoroscopic Guidance  Patient: Brandon Snow      Date of Birth: July 11, 1960 MRN: [id number] PCP: Sandre Kitty, MD      Visit Date: 03/14/2023   Universal Protocol:    Date/Time: 03/14/2023  Consent Given By: the patient  Position: PRONE  Additional Comments: Vital signs were monitored before and after the procedure. Patient was prepped and draped in the usual sterile fashion. The correct patient, procedure, and site was verified.   Injection Procedure Details:   Procedure diagnoses: Lumbar radiculopathy [M54.16]    Meds Administered:  Meds ordered this encounter  Medications   methylPREDNISolone acetate (DEPO-MEDROL) injection 40 mg   ibuprofen (ADVIL) 800 MG tablet    Sig: Take 1 tablet (800 mg total) by mouth every 8 (eight) hours as needed.    Dispense:  90 tablet    Refill:  0    Laterality: Left  Location/Site: L3  Needle:5.0 in., 22 ga.  Short bevel or Quincke spinal needle  Needle Placement: Transforaminal  Findings:    -Comments: Excellent flow of contrast along the nerve, nerve root and into the epidural space.  Procedure Details: After squaring off the end-plates to get a true AP view, the C-arm was positioned so that an oblique view of the foramen as noted above was visualized. The target area is just inferior to the "nose of the scotty dog" or sub pedicular. The soft tissues overlying this structure were infiltrated with 2-3 ml. of 1% Lidocaine without Epinephrine.  The spinal needle was inserted toward the target using a "trajectory" view along the fluoroscope beam.  Under AP and lateral visualization, the needle was advanced so it did not puncture dura and was located close the 6 O'Clock position of the pedical in AP tracterory. Biplanar projections were used to confirm position. Aspiration was confirmed to be negative for CSF and/or blood. A 1-2 ml. volume of  Isovue-250 was injected and flow of contrast was noted at each level. Radiographs were obtained for documentation purposes.   After attaining the desired flow of contrast documented above, a 0.5 to 1.0 ml test dose of 0.25% Marcaine was injected into each respective transforaminal space.  The patient was observed for 90 seconds post injection.  After no sensory deficits were reported, and normal lower extremity motor function was noted,   the above injectate was administered so that equal amounts of the injectate were placed at each foramen (level) into the transforaminal epidural space.   Additional Comments:  No complications occurred Dressing: 2 x 2 sterile gauze and Band-Aid    Post-procedure details: Patient was observed during the procedure. Post-procedure instructions were reviewed.  Patient left the clinic in stable condition.

## 2023-03-27 NOTE — Progress Notes (Signed)
 Brandon Snow - 63 y.o. male MRN [id number]  Date of birth: 1960-09-28  Office Visit Note: Visit Date: 03/14/2023 PCP: Sandre Kitty, MD Referred by: Sandre Kitty, MD  Subjective: Chief Complaint  Patient presents with   Lower Back - Pain   HPI:  Brandon Snow is a 63 y.o. male who comes in today at the request of Ellin Goodie, FNP for planned Left L3-4 Lumbar Transforaminal epidural steroid injection with fluoroscopic guidance.  The patient has failed conservative care including home exercise, medications, time and activity modification.  This injection will be diagnostic and hopefully therapeutic.  Please see requesting physician notes for further details and justification.   ROS Otherwise per HPI.  Assessment & Plan: Visit Diagnoses:    ICD-10-CM   1. Lumbar radiculopathy  M54.16 XR C-ARM NO REPORT    Epidural Steroid injection    methylPREDNISolone acetate (DEPO-MEDROL) injection 40 mg      Plan: No additional findings.   Meds & Orders:  Meds ordered this encounter  Medications   methylPREDNISolone acetate (DEPO-MEDROL) injection 40 mg   ibuprofen (ADVIL) 800 MG tablet    Sig: Take 1 tablet (800 mg total) by mouth every 8 (eight) hours as needed.    Dispense:  90 tablet    Refill:  0    Orders Placed This Encounter  Procedures   XR C-ARM NO REPORT   Epidural Steroid injection    Follow-up: Return if symptoms worsen or fail to improve.   Procedures: No procedures performed  Lumbosacral Transforaminal Epidural Steroid Injection - Sub-Pedicular Approach with Fluoroscopic Guidance  Patient: Brandon Snow      Date of Birth: 10/22/1960 MRN: [id number] PCP: Sandre Kitty, MD      Visit Date: 03/14/2023   Universal Protocol:    Date/Time: 03/14/2023  Consent Given By: the patient  Position: PRONE  Additional Comments: Vital signs were monitored before and after the procedure. Patient was prepped and draped in the usual sterile  fashion. The correct patient, procedure, and site was verified.   Injection Procedure Details:   Procedure diagnoses: Lumbar radiculopathy [M54.16]    Meds Administered:  Meds ordered this encounter  Medications   methylPREDNISolone acetate (DEPO-MEDROL) injection 40 mg   ibuprofen (ADVIL) 800 MG tablet    Sig: Take 1 tablet (800 mg total) by mouth every 8 (eight) hours as needed.    Dispense:  90 tablet    Refill:  0    Laterality: Left  Location/Site: L3  Needle:5.0 in., 22 ga.  Short bevel or Quincke spinal needle  Needle Placement: Transforaminal  Findings:    -Comments: Excellent flow of contrast along the nerve, nerve root and into the epidural space.  Procedure Details: After squaring off the end-plates to get a true AP view, the C-arm was positioned so that an oblique view of the foramen as noted above was visualized. The target area is just inferior to the "nose of the scotty dog" or sub pedicular. The soft tissues overlying this structure were infiltrated with 2-3 ml. of 1% Lidocaine without Epinephrine.  The spinal needle was inserted toward the target using a "trajectory" view along the fluoroscope beam.  Under AP and lateral visualization, the needle was advanced so it did not puncture dura and was located close the 6 O'Clock position of the pedical in AP tracterory. Biplanar projections were used to confirm position. Aspiration was confirmed to be negative for CSF and/or blood. A 1-2 ml. volume of  Isovue-250 was injected and flow of contrast was noted at each level. Radiographs were obtained for documentation purposes.   After attaining the desired flow of contrast documented above, a 0.5 to 1.0 ml test dose of 0.25% Marcaine was injected into each respective transforaminal space.  The patient was observed for 90 seconds post injection.  After no sensory deficits were reported, and normal lower extremity motor function was noted,   the above injectate was administered  so that equal amounts of the injectate were placed at each foramen (level) into the transforaminal epidural space.   Additional Comments:  No complications occurred Dressing: 2 x 2 sterile gauze and Band-Aid    Post-procedure details: Patient was observed during the procedure. Post-procedure instructions were reviewed.  Patient left the clinic in stable condition.    Clinical History: No specialty comments available.     Objective:  VS:  HT:    WT:   BMI:     BP:130/80  HR:65bpm  TEMP: ( )  RESP:  Physical Exam Vitals and nursing note reviewed.  Constitutional:      General: He is not in acute distress.    Appearance: Normal appearance. He is obese. He is not ill-appearing.  HENT:     Head: Normocephalic and atraumatic.     Right Ear: External ear normal.     Left Ear: External ear normal.     Nose: No congestion.  Eyes:     Extraocular Movements: Extraocular movements intact.  Cardiovascular:     Rate and Rhythm: Normal rate.     Pulses: Normal pulses.  Pulmonary:     Effort: Pulmonary effort is normal. No respiratory distress.  Abdominal:     General: There is no distension.     Palpations: Abdomen is soft.  Musculoskeletal:        General: No tenderness or signs of injury.     Cervical back: Neck supple.     Right lower leg: No edema.     Left lower leg: No edema.     Comments: Patient has good distal strength without clonus.  Skin:    Findings: No erythema or rash.  Neurological:     General: No focal deficit present.     Mental Status: He is alert and oriented to person, place, and time.     Sensory: No sensory deficit.     Motor: No weakness or abnormal muscle tone.     Coordination: Coordination normal.  Psychiatric:        Mood and Affect: Mood normal.        Behavior: Behavior normal.      Imaging: No results found.

## 2023-04-03 ENCOUNTER — Ambulatory Visit: Payer: 59 | Admitting: Physical Medicine and Rehabilitation

## 2023-04-05 ENCOUNTER — Ambulatory Visit: Payer: 59 | Admitting: Physical Medicine and Rehabilitation

## 2023-04-12 ENCOUNTER — Telehealth: Payer: Self-pay

## 2023-04-12 ENCOUNTER — Encounter

## 2023-04-12 NOTE — Telephone Encounter (Signed)
 Patient was called for his Pre Visit appointment. Patient answered and said that he didn't have time to do his visit today. Patient states he will call back and reschedule PV because he didn't have time at the moment. Patient was informed that he needed to reschedule before 5:00 PM today or his procedure would be cancelled per [facility] guidelines. Patient states he would call back soon.   Janalee Dane, LPN ( P V )

## 2023-04-16 NOTE — Progress Notes (Deleted)
   Established Patient Office Visit  Subjective   Patient ID: Brandon Snow, male    DOB: [birth year]  Age: 63 y.o. MRN: [id number]  No chief complaint on file.   HPI  Hld - didn't get labs:   Htn -   Low back pain - epidural steroid injection    The 10-year ASCVD risk score (Arnett DK, et al., 2019) is: 13.6%  Health Maintenance Due  Topic Date Due   Pneumococcal Vaccine 63-74 Years old (1 of 2 - PCV) Never done   Hepatitis C Screening  Never done   Colonoscopy  Never done   COVID-19 Vaccine (1 - 2024-25 season) Never done      Objective:     There were no vitals taken for this visit. {Vitals History (Optional):23777}  Physical Exam   No results found for any visits on 04/17/23.      Assessment & Plan:   There are no diagnoses linked to this encounter.   No follow-ups on file.    Sandre Kitty, MD

## 2023-04-17 ENCOUNTER — Ambulatory Visit: Payer: 59 | Admitting: Family Medicine

## 2023-04-24 ENCOUNTER — Encounter: Payer: Self-pay | Admitting: Gastroenterology

## 2023-04-26 ENCOUNTER — Ambulatory Visit: Admitting: Physical Medicine and Rehabilitation

## 2023-05-04 ENCOUNTER — Other Ambulatory Visit: Payer: Self-pay | Admitting: Physical Medicine and Rehabilitation

## 2023-05-08 ENCOUNTER — Encounter: Payer: 59 | Admitting: Gastroenterology

## 2023-05-10 ENCOUNTER — Ambulatory Visit: Admitting: Physical Medicine and Rehabilitation

## 2023-05-22 ENCOUNTER — Ambulatory Visit: Admitting: Physical Medicine and Rehabilitation

## 2023-06-01 ENCOUNTER — Encounter

## 2023-06-02 ENCOUNTER — Ambulatory Visit

## 2023-06-02 VITALS — Ht 68.0 in | Wt 278.0 lb

## 2023-06-02 DIAGNOSIS — Z1211 Encounter for screening for malignant neoplasm of colon: Secondary | ICD-10-CM

## 2023-06-02 MED ORDER — NA SULFATE-K SULFATE-MG SULF 17.5-3.13-1.6 GM/177ML PO SOLN: 1.0000 | Freq: Once | ORAL | 0 refills | Status: AC

## 2023-06-02 NOTE — Progress Notes (Signed)
 No egg or soy allergy known to patient  No issues known to pt with past sedation with any surgeries or procedures Patient denies ever being told they had issues or difficulty with intubation  No FH of Malignant Hyperthermia Pt is not on diet pills Pt is not on  home 02  Pt is not on blood thinners  Pt with occ constipation  No A fib or A flutter Have any cardiac testing pending--no  LOA: independent  Prep: suprep   Patient's chart reviewed by Cathlyn Parsons CNRA prior to previsit and patient appropriate for the [facility].  Previsit completed and red dot placed by patient's name on their procedure day (on provider's schedule).     PV completed with patient. Prep instructions sent via mychart and home address.

## 2023-06-06 ENCOUNTER — Ambulatory Visit: Admitting: Family Medicine

## 2023-06-08 ENCOUNTER — Ambulatory Visit: Admitting: Family Medicine

## 2023-06-14 ENCOUNTER — Other Ambulatory Visit: Payer: Self-pay | Admitting: Physical Medicine and Rehabilitation

## 2023-06-20 ENCOUNTER — Encounter: Admitting: Gastroenterology

## 2023-06-20 ENCOUNTER — Telehealth: Payer: Self-pay | Admitting: Gastroenterology

## 2023-06-20 NOTE — Telephone Encounter (Signed)
 Hi Dr Karene Oto,   I called patient regarding his procedure, he stated he forgot about it and did not prep. Patient states he will call back to reschedule. I will no show him for today's procedure.  Thank you

## 2023-07-10 DIAGNOSIS — Z833 Family history of diabetes mellitus: Secondary | ICD-10-CM | POA: Diagnosis not present

## 2023-07-10 DIAGNOSIS — Z6841 Body Mass Index (BMI) 40.0 and over, adult: Secondary | ICD-10-CM | POA: Diagnosis not present

## 2023-07-10 DIAGNOSIS — Z809 Family history of malignant neoplasm, unspecified: Secondary | ICD-10-CM | POA: Diagnosis not present

## 2023-07-10 DIAGNOSIS — M4800 Spinal stenosis, site unspecified: Secondary | ICD-10-CM | POA: Diagnosis not present

## 2023-07-10 DIAGNOSIS — I10 Essential (primary) hypertension: Secondary | ICD-10-CM | POA: Diagnosis not present

## 2023-07-10 DIAGNOSIS — M5440 Lumbago with sciatica, unspecified side: Secondary | ICD-10-CM | POA: Diagnosis not present

## 2023-07-10 DIAGNOSIS — Z720 Tobacco use: Secondary | ICD-10-CM | POA: Diagnosis not present

## 2023-07-10 DIAGNOSIS — Z823 Family history of stroke: Secondary | ICD-10-CM | POA: Diagnosis not present

## 2023-07-10 DIAGNOSIS — Z791 Long term (current) use of non-steroidal anti-inflammatories (NSAID): Secondary | ICD-10-CM | POA: Diagnosis not present

## 2023-07-20 ENCOUNTER — Other Ambulatory Visit: Payer: Self-pay | Admitting: Physical Medicine and Rehabilitation

## 2023-08-01 ENCOUNTER — Ambulatory Visit: Admitting: Family Medicine

## 2023-08-01 ENCOUNTER — Encounter: Payer: Self-pay | Admitting: Family Medicine

## 2023-08-01 VITALS — BP 126/86 | HR 71 | Ht 68.0 in | Wt 307.4 lb

## 2023-08-01 DIAGNOSIS — E66813 Obesity, class 3: Secondary | ICD-10-CM

## 2023-08-01 DIAGNOSIS — Z6841 Body Mass Index (BMI) 40.0 and over, adult: Secondary | ICD-10-CM | POA: Diagnosis not present

## 2023-08-01 DIAGNOSIS — R7303 Prediabetes: Secondary | ICD-10-CM

## 2023-08-01 DIAGNOSIS — Z125 Encounter for screening for malignant neoplasm of prostate: Secondary | ICD-10-CM

## 2023-08-01 NOTE — Progress Notes (Signed)
   Established Patient Office Visit  Subjective   Patient ID: Brandon Snow, male    DOB: 26-Aug-1960  Age: 63 y.o. MRN: [id number]  Chief Complaint  Patient presents with   Medical Management of Chronic Issues    HPI  Subjective - Weight gain from 289 lbs in December to current 307 lbs - Attributes weight gain to alcohol consumption and eating habits - Reports drinking daily: 2-3 shots of vodka or tequila mixed with juice after work - Acknowledges eating crazy stuff and poor portion control - Had 49-month period of inactivity due to spinal stenosis and sciatica pain - Unable to exercise or go to gym during back pain episode - Recently resumed exercising about 1 month ago - Requests physical exam - Reports coffee consumption: 2 cups morning, 1 cup evening with significant sugar  Medications: None specifically mentioned.  PMHx: spinal stenosis, sciatica. PSH: none mentioned. FHx: none mentioned. Social Hx: daily alcohol use (2-3 shots vodka/tequila with juice), regular coffee consumption with sugar, previous 10-year smoking cessation with subsequent relapse.  ROS: denies other concerns beyond weight.   The ASCVD Risk score (Arnett DK, et al., 2019) failed to calculate for the following reasons:   Cannot find a previous HDL lab   Cannot find a previous total cholesterol lab  Health Maintenance Due  Topic Date Due   Hepatitis C Screening  Never done   Pneumococcal Vaccine 80-69 Years old (1 of 2 - PCV) Never done   Colonoscopy  Never done   Zoster Vaccines- Shingrix (1 of 2) Never done   COVID-19 Vaccine (1 - 2024-25 season) Never done      Objective:     BP 126/86   Pulse 71   Ht 5' 8 (1.727 m)   Wt (!) 307 lb 6.4 oz (139.4 kg)   SpO2 96%   BMI 46.74 kg/m    Physical Exam Gen: alert, oriented Pulm: no resp distress Psych: pleasant affect   No results found for any visits on 08/01/23.      Assessment & Plan:   Class 3 severe obesity due to excess  calories without serious comorbidity with body mass index (BMI) of 45.0 to 49.9 in adult Assessment & Plan:  Reports 18 lb weight gain since December visit (289 to 307 lbs). Contributing factors include daily alcohol consumption with high-calorie mixers, poor dietary habits, and 81-month period of inactivity due to spinal stenosis. Recently resumed exercise 1 month ago after cortisone injections in February improved mobility. - [facility] for [facility] counseling - Eliminate calories from liquids including juice, soda, sweet tea, coffee sweetener, and alcohol - Substitute with no-calorie sweeteners when possible - Schedule fasting labs before next visit - Follow-up in 1 month to review labs and discuss potential obesity medications if appropriate  Orders: -     Amb Ref to [facility]  Prostate cancer screening -     PSA; Future  Prediabetes -     CBC with Differential/Platelet; Future     Return in about 4 weeks (around 08/29/2023) for physical.    Toribio MARLA Slain, MD

## 2023-08-01 NOTE — Assessment & Plan Note (Signed)
 Reports 18 lb weight gain since December visit (289 to 307 lbs). Contributing factors include daily alcohol consumption with high-calorie mixers, poor dietary habits, and 42-month period of inactivity due to spinal stenosis. Recently resumed exercise 1 month ago after cortisone injections in February improved mobility. - [facility]erral [facility] for nutritional counseling - Eliminate calories from liquids including juice, soda, sweet tea, coffee sweetener, and alcohol - Substitute with no-calorie sweeteners when possible - Schedule fasting labs before next visit - Follow-up in 1 month to review labs and discuss potential obesity medications if appropriate

## 2023-08-01 NOTE — Patient Instructions (Signed)
 It was nice to see you today,  We addressed the following topics today: - I am sending in a referral to the [facility].  Someone should call you in the next few weeks. - Before your visit with me next month you will get some labs and we can discuss labs at your visit and if any medications for obesity are appropriate. -The easiest things to eliminate are calories from liquids this includes juice, soda, sweet tea, sweetener for your coffee, and alcohol.  Try to substitute with no calorie sweeteners when you can.  Have a great day,  Rolan Slain, MD

## 2023-08-03 ENCOUNTER — Institutional Professional Consult (permissible substitution): Admitting: Family Medicine

## 2023-08-03 DIAGNOSIS — Z0289 Encounter for other administrative examinations: Secondary | ICD-10-CM

## 2023-08-08 ENCOUNTER — Encounter: Payer: Self-pay | Admitting: Family Medicine

## 2023-08-08 ENCOUNTER — Ambulatory Visit: Admitting: Family Medicine

## 2023-08-08 VITALS — BP 135/80 | HR 70 | Temp 98.2°F | Ht 68.0 in | Wt 298.0 lb

## 2023-08-08 DIAGNOSIS — E785 Hyperlipidemia, unspecified: Secondary | ICD-10-CM

## 2023-08-08 DIAGNOSIS — R7303 Prediabetes: Secondary | ICD-10-CM

## 2023-08-08 DIAGNOSIS — E66813 Obesity, class 3: Secondary | ICD-10-CM | POA: Diagnosis not present

## 2023-08-08 DIAGNOSIS — Z6841 Body Mass Index (BMI) 40.0 and over, adult: Secondary | ICD-10-CM | POA: Diagnosis not present

## 2023-08-08 NOTE — Progress Notes (Signed)
 Office: [phone]  /  Fax: [phone]   Initial Visit  Brandon Snow was seen in clinic today to evaluate for obesity. He is interested in losing weight to improve overall health and reduce the risk of weight related complications. He presents today to review program treatment options, initial physical assessment, and evaluation.     He was referred by: PCP  When asked what else they would like to accomplish? He states: Adopt a healthier eating pattern and lifestyle, Improve energy levels and physical activity, Improve existing medical conditions, and Improve quality of life.  Would like to be 240 lb.  Lives alone.  Works as a Sales executive  [employer] history:  lifetime hx of obesity, 315--> 270 lb last year but regained weight after a bout of sciatica and stopped working out.    When asked how has your weight affected you? He states: Contributed to medical problems and Contributed to orthopedic problems or mobility issues  Some associated conditions: Prediabetes and Other: spinal stenosis  Contributing factors: family history of obesity, consumption of processed foods, and chronic skipping of meals  Weight promoting medications identified: None  Current nutrition plan: None  Current level of physical activity: [employer] 3 x a week - stationary bike, ab works, [employer] training  Current or previous pharmacotherapy: None  Response to medication: Never tried medications   Past medical history includes:   Past Medical History:  Diagnosis Date   COVID-19 2021   Hypertension      Objective:   BP 135/80   Pulse 70   Temp 98.2 F (36.8 C)   Ht 5' 8 (1.727 m)   Wt 298 lb (135.2 kg)   SpO2 98%   BMI 45.31 kg/m  He was weighed on the bioimpedance scale: Body mass index is 45.31 kg/m.  Peak Weight:? , Body Fat%:39.5, Visceral Fat Rating:28, Weight trend over the last 12 months: Increasing  General:  Alert, oriented and cooperative. Patient is in no acute distress.   Respiratory: Normal respiratory effort, no problems with respiration noted   Gait: able to ambulate independently  Mental Status: Normal mood and affect. Normal behavior. Normal judgment and thought content.   DIAGNOSTIC DATA REVIEWED:  BMET    Component Value Date/Time   NA 136 01/01/2022 0240   NA 139 06/15/2020 1041   K 4.1 01/01/2022 0240   CL 107 01/01/2022 0240   CO2 19 (L) 01/01/2022 0240   GLUCOSE 96 01/01/2022 0240   BUN 12 01/01/2022 0240   BUN 13 06/15/2020 1041   CREATININE 1.07 01/01/2022 0240   CALCIUM 8.3 (L) 01/01/2022 0240   GFRNONAA >60 01/01/2022 0240   GFRAA 116 11/18/2019 1156   Lab Results  Component Value Date   HGBA1C 5.7 (H) 06/15/2020   HGBA1C 5.6 11/01/2019   No results found for: INSULIN  CBC    Component Value Date/Time   WBC 4.9 06/15/2020 1041   WBC 8.7 11/14/2019 0058   RBC 4.70 06/15/2020 1041   RBC 4.43 11/14/2019 0058   HGB 15.5 06/15/2020 1041   HCT 46.0 06/15/2020 1041   PLT 238 06/15/2020 1041   MCV 98 (H) 06/15/2020 1041   MCH 33.0 06/15/2020 1041   MCH 31.8 11/14/2019 0058   MCHC 33.7 06/15/2020 1041   MCHC 33.3 11/14/2019 0058   RDW 11.9 06/15/2020 1041   Iron/TIBC/Ferritin/ %Sat    Component Value Date/Time   FERRITIN 3,423 (H) 10/30/2019 1918   Lipid Panel     Component Value Date/Time  CHOL 233 (H) 06/15/2020 1041   TRIG 147 06/15/2020 1041   HDL 67 06/15/2020 1041   CHOLHDL 3.5 06/15/2020 1041   LDLCALC 140 (H) 06/15/2020 1041   Hepatic Function Panel     Component Value Date/Time   PROT 7.3 06/15/2020 1041   ALBUMIN 4.1 06/15/2020 1041   AST 20 06/15/2020 1041   ALT 21 06/15/2020 1041   ALKPHOS 89 06/15/2020 1041   BILITOT 0.8 06/15/2020 1041      Component Value Date/Time   TSH 1.160 06/15/2020 1041     Assessment and Plan:   Prediabetes Look for prediabetes resolution with dietary change and weight loss  Class 3 severe obesity due to excess calories with body mass index (BMI) of 45.0 to  49.9 in adult  Hyperlipidemia, unspecified hyperlipidemia type He is not on any lipid lowering medication Look for improvements with a low sugar/ low saturated fat diet     Obesity Treatment / Action Plan:  Patient will work on garnering support from family and friends to begin weight loss journey. Will work on eliminating or reducing the presence of highly palatable, calorie dense foods in the home. Will complete provided nutritional and psychosocial assessment questionnaire before the next appointment. Will be scheduled for indirect calorimetry to determine resting energy expenditure in a fasting state.  This will allow us  to create a reduced calorie, high-protein meal plan to promote loss of fat mass while preserving muscle mass. Will think about ideas on how to incorporate physical activity into their daily routine. Counseled on the health benefits of losing 5%-15% of total body weight. Was counseled on nutritional approaches to weight loss and benefits of reducing processed foods and consuming plant-based foods and high quality protein as part of nutritional weight management. Was counseled on pharmacotherapy and role as an adjunct in weight management.   Obesity Education Performed Today:  He was weighed on the bioimpedance scale and results were discussed and documented in the synopsis.  We discussed obesity as a disease and the importance of a more detailed evaluation of all the factors contributing to the disease.  We discussed the importance of long term lifestyle changes which include nutrition, exercise and behavioral modifications as well as the importance of customizing this to his specific health and social needs.  We discussed the benefits of reaching a healthier weight to alleviate the symptoms of existing conditions and reduce the risks of the biomechanical, metabolic and psychological effects of obesity.  OTHON GUARDIA appears to be in the action stage of change and  states they are ready to start intensive lifestyle modifications and behavioral modifications.  22 minutes was spent today on this visit including the above counseling, pre-visit chart review, and post-visit documentation.  Reviewed by clinician on day of visit: allergies, medications, problem list, medical history, surgical history, family history, social history, and previous encounter notes pertinent to obesity diagnosis.    Darice Haddock, D.O. DABFM, [facility] [address] [place], [place] [address] [phone]

## 2023-08-09 ENCOUNTER — Encounter (HOSPITAL_COMMUNITY): Payer: Self-pay

## 2023-08-09 ENCOUNTER — Ambulatory Visit (HOSPITAL_COMMUNITY): Admission: EM | Admit: 2023-08-09 | Discharge: 2023-08-09 | Disposition: A | Discharge status: Home / Self Care

## 2023-08-09 DIAGNOSIS — J029 Acute pharyngitis, unspecified: Secondary | ICD-10-CM

## 2023-08-09 LAB — POCT RAPID STREP A (OFFICE): Rapid Strep A Screen: NEGATIVE

## 2023-08-09 MED ORDER — AZELASTINE HCL 0.1 % NA SOLN: 1.0000 | Freq: Two times a day (BID) | NASAL | 1 refills | Status: AC

## 2023-08-09 MED ORDER — FEXOFENADINE HCL 180 MG PO TABS
180.0000 mg | ORAL_TABLET | Freq: Every day | ORAL | 0 refills | Status: AC
Start: 2023-08-09 — End: ?

## 2023-08-09 NOTE — Discharge Instructions (Addendum)
  1. Acute pharyngitis, unspecified etiology (Primary) - POC rapid strep A performed in UC is negative for strep pharyngitis - azelastine  (ASTELIN ) 0.1 % nasal spray; Place 1 spray into both nostrils 2 (two) times daily. Use in each nostril as directed for postnasal drip and throat inflammation - fexofenadine  (ALLEGRA ) 180 MG tablet; Take 1 tablet (180 mg total) by mouth daily for postnasal drip and sore throat -Continue to monitor symptoms for any change in severity if there is any escalation of current symptoms or development of new symptoms follow-up in ER for further evaluation and management.

## 2023-08-09 NOTE — ED Provider Notes (Signed)
 [facility]  Note:  This document was prepared using Dragon voice recognition software and may include unintentional dictation errors.  MRN: [id number] DOB: 12/20/1960  Subjective:   Brandon Snow is a 63 y.o. male presenting for Sore throat x 3 days with intermittent laryngitis.  Denies nasal congestion, fever, or cough, body aches, fever.  Patient has been taking Benadryl with minimal to no relief.  Patient denies any sick contacts or recent travel.  Patient has not used any other over-the-counter medication to treat symptoms.  No current facility-administered medications for this encounter.  Current Outpatient Medications:    azelastine  (ASTELIN ) 0.1 % nasal spray, Place 1 spray into both nostrils 2 (two) times daily. Use in each nostril as directed, Disp: 30 mL, Rfl: 1   fexofenadine  (ALLEGRA ) 180 MG tablet, Take 1 tablet (180 mg total) by mouth daily., Disp: 30 tablet, Rfl: 0   ibuprofen  (ADVIL ) 800 MG tablet, TAKE 1 TABLET BY MOUTH EVERY 8 HOURS AS NEEDED, Disp: 90 tablet, Rfl: 0   Multiple Vitamin (MULTIVITAMIN) tablet, Take 1 tablet by mouth daily., Disp: , Rfl:    No Known Allergies  Past Medical History:  Diagnosis Date   COVID-19 2021   Hypertension      History reviewed. No pertinent surgical history.  Family History  Problem Relation Age of Onset   Diabetes Mother    Hyperlipidemia Mother    Hyperlipidemia Father    Hypertension Father    Hypertension Sister    Hyperlipidemia Sister    Diabetes Sister    Diabetes Maternal Grandfather    Diabetes Paternal Grandmother    Hyperlipidemia Paternal Grandmother    Hypertension Paternal Grandmother    Sudden death Paternal Grandmother    Heart attack Neg Hx    Colon cancer Neg Hx    Rectal cancer Neg Hx    Stomach cancer Neg Hx     Social History   Tobacco Use   Smoking status: Some Days    Current packs/day: 0.50    Types: Cigarettes   Smokeless tobacco: Never  Vaping Use   Vaping  status: Never Used  Substance Use Topics   Alcohol use: Yes    Comment: sometimes   Drug use: No    ROS Refer to HPI for ROS details.  Objective:   Vitals: BP 124/73 (BP Location: Right Arm)   Pulse 64   Temp 98 F (36.7 C) (Oral)   Resp 16   SpO2 94%   Physical Exam Vitals and nursing note reviewed.  Constitutional:      General: He is not in acute distress.    Appearance: He is well-developed. He is not ill-appearing or toxic-appearing.  HENT:     Head: Normocephalic.     Nose: Nose normal. No congestion or rhinorrhea.     Mouth/Throat:     Mouth: Mucous membranes are moist.     Pharynx: Oropharynx is clear. No oropharyngeal exudate or posterior oropharyngeal erythema.  Cardiovascular:     Rate and Rhythm: Normal rate.  Pulmonary:     Effort: Pulmonary effort is normal. No respiratory distress.  Musculoskeletal:     Cervical back: Normal range of motion and neck supple. No tenderness.  Skin:    General: Skin is warm and dry.  Neurological:     General: No focal deficit present.     Mental Status: He is alert and oriented to person, place, and time.  Psychiatric:        Mood and  Affect: Mood normal.        Behavior: Behavior normal.     Procedures  Results for orders placed or performed during the hospital encounter of 08/09/23 (from the past 24 hours)  POC rapid strep A     Status: None   Collection Time: 08/09/23  4:07 PM  Result Value Ref Range   Rapid Strep A Screen Negative Negative    No results found.   Assessment and Plan :     Discharge Instructions       1. Acute pharyngitis, unspecified etiology (Primary) - POC rapid strep A performed in UC is negative for strep pharyngitis - azelastine  (ASTELIN ) 0.1 % nasal spray; Place 1 spray into both nostrils 2 (two) times daily. Use in each nostril as directed for postnasal drip and throat inflammation - fexofenadine  (ALLEGRA ) 180 MG tablet; Take 1 tablet (180 mg total) by mouth daily for  postnasal drip and sore throat -Continue to monitor symptoms for any change in severity if there is any escalation of current symptoms or development of new symptoms follow-up in ER for further evaluation and management.      Mariska Daffin B Shanequa [place]   Mazal Ebey, [place] B, [place] 08/09/23 1645

## 2023-08-09 NOTE — ED Triage Notes (Signed)
 Patient here today with c/o ST X 3 days. Patient states that he has some hoarseness off and on. Denies nasal congestion. Denies fever. He tried taking Benadryl yesterday with no relief. Patient states that it just made him sleepy. No known sick contacts. No recent travel.

## 2023-08-29 ENCOUNTER — Other Ambulatory Visit

## 2023-08-29 DIAGNOSIS — E66813 Obesity, class 3: Secondary | ICD-10-CM

## 2023-08-29 DIAGNOSIS — E7800 Pure hypercholesterolemia, unspecified: Secondary | ICD-10-CM

## 2023-08-29 DIAGNOSIS — Z125 Encounter for screening for malignant neoplasm of prostate: Secondary | ICD-10-CM

## 2023-08-29 DIAGNOSIS — R7303 Prediabetes: Secondary | ICD-10-CM

## 2023-08-30 ENCOUNTER — Other Ambulatory Visit

## 2023-08-31 ENCOUNTER — Other Ambulatory Visit: Payer: Self-pay | Admitting: Physical Medicine and Rehabilitation

## 2023-08-31 ENCOUNTER — Other Ambulatory Visit

## 2023-09-02 LAB — COMPREHENSIVE METABOLIC PANEL WITH GFR
ALT: 20 IU/L (ref 0–44)
AST: 23 IU/L (ref 0–40)
Albumin: 4.1 g/dL (ref 3.8–4.9)
Alkaline Phosphatase: 104 IU/L (ref 44–121)
BUN/Creatinine Ratio: 14 (ref 10–24)
BUN: 12 mg/dL (ref 8–27)
Bilirubin Total: 0.6 mg/dL (ref 0.0–1.2)
CO2: 19 mmol/L — ABNORMAL LOW (ref 20–29)
Calcium: 9 mg/dL (ref 8.6–10.2)
Chloride: 104 mmol/L (ref 96–106)
Creatinine, Ser: 0.87 mg/dL (ref 0.76–1.27)
Globulin, Total: 3 g/dL (ref 1.5–4.5)
Glucose: 105 mg/dL — ABNORMAL HIGH (ref 70–99)
Potassium: 4.5 mmol/L (ref 3.5–5.2)
Sodium: 141 mmol/L (ref 134–144)
Total Protein: 7.1 g/dL (ref 6.0–8.5)
eGFR: 99 mL/min/1.73 (ref >59)

## 2023-09-02 LAB — LIPID PANEL
Chol/HDL Ratio: 3.5 ratio (ref 0.0–5.0)
Cholesterol, Total: 241 mg/dL — ABNORMAL HIGH (ref 100–199)
HDL: 68 mg/dL (ref >39)
LDL Chol Calc (NIH): 156 mg/dL — ABNORMAL HIGH (ref 0–99)
Triglycerides: 100 mg/dL (ref 0–149)
VLDL Cholesterol Cal: 17 mg/dL (ref 5–40)

## 2023-09-02 LAB — CBC WITH DIFFERENTIAL/PLATELET
Basophils Absolute: 0 x10E3/uL (ref 0.0–0.2)
Basos: 1 %
EOS (ABSOLUTE): 0.2 x10E3/uL (ref 0.0–0.4)
Eos: 3 %
Hematocrit: 42.1 % (ref 37.5–51.0)
Hemoglobin: 14 g/dL (ref 13.0–17.7)
Immature Grans (Abs): 0 x10E3/uL (ref 0.0–0.1)
Immature Granulocytes: 0 %
Lymphocytes Absolute: 1.9 x10E3/uL (ref 0.7–3.1)
Lymphs: 29 %
MCH: 33.6 pg — ABNORMAL HIGH (ref 26.6–33.0)
MCHC: 33.3 g/dL (ref 31.5–35.7)
MCV: 101 fL — ABNORMAL HIGH (ref 79–97)
Monocytes Absolute: 0.5 x10E3/uL (ref 0.1–0.9)
Monocytes: 7 %
Neutrophils Absolute: 4 x10E3/uL (ref 1.4–7.0)
Neutrophils: 60 %
Platelets: 243 x10E3/uL (ref 150–450)
RBC: 4.17 x10E6/uL (ref 4.14–5.80)
RDW: 11.8 % (ref 11.6–15.4)
WBC: 6.6 x10E3/uL (ref 3.4–10.8)

## 2023-09-02 LAB — PSA: Prostate Specific Ag, Serum: 5.1 ng/mL — ABNORMAL HIGH (ref 0.0–4.0)

## 2023-09-02 LAB — HEMOGLOBIN A1C
Est. average glucose Bld gHb Est-mCnc: 108 mg/dL
Hgb A1c MFr Bld: 5.4 % (ref 4.8–5.6)

## 2023-09-04 ENCOUNTER — Ambulatory Visit: Payer: Self-pay | Admitting: Family Medicine

## 2023-09-05 ENCOUNTER — Ambulatory Visit (INDEPENDENT_AMBULATORY_CARE_PROVIDER_SITE_OTHER): Admitting: Family Medicine

## 2023-09-05 ENCOUNTER — Encounter: Payer: Self-pay | Admitting: Family Medicine

## 2023-09-05 VITALS — BP 132/83 | HR 84 | Ht 68.0 in | Wt 303.4 lb

## 2023-09-05 DIAGNOSIS — I10 Essential (primary) hypertension: Secondary | ICD-10-CM

## 2023-09-05 DIAGNOSIS — E7800 Pure hypercholesterolemia, unspecified: Secondary | ICD-10-CM

## 2023-09-05 DIAGNOSIS — Z0000 Encounter for general adult medical examination without abnormal findings: Secondary | ICD-10-CM | POA: Diagnosis not present

## 2023-09-05 DIAGNOSIS — J029 Acute pharyngitis, unspecified: Secondary | ICD-10-CM

## 2023-09-05 DIAGNOSIS — Z1211 Encounter for screening for malignant neoplasm of colon: Secondary | ICD-10-CM | POA: Diagnosis not present

## 2023-09-05 DIAGNOSIS — Z1212 Encounter for screening for malignant neoplasm of rectum: Secondary | ICD-10-CM

## 2023-09-05 DIAGNOSIS — N529 Male erectile dysfunction, unspecified: Secondary | ICD-10-CM

## 2023-09-05 DIAGNOSIS — R972 Elevated prostate specific antigen [PSA]: Secondary | ICD-10-CM

## 2023-09-05 MED ORDER — ROSUVASTATIN CALCIUM 5 MG PO TABS: 5.0000 mg | ORAL_TABLET | Freq: Every day | ORAL | 3 refills | Status: DC

## 2023-09-05 MED ORDER — CEPHALEXIN 500 MG PO CAPS: 500.0000 mg | ORAL_CAPSULE | Freq: Two times a day (BID) | ORAL | 0 refills | Status: AC

## 2023-09-05 MED ORDER — TADALAFIL 10 MG PO TABS: 10.0000 mg | ORAL_TABLET | ORAL | 1 refills | Status: AC | PRN

## 2023-09-05 NOTE — Progress Notes (Unsigned)
 Established Patient Office Visit  Subjective   Patient ID: Brandon Snow, male    DOB: 14-Sep-1960  Age: 63 y.o. MRN: [id number]  Chief Complaint  Patient presents with   Annual Exam    HPI  Subjective - Follow-up on recent lab work. Reports LDL was 156, and PSA was 5.1 (up from 3.8 three years ago). - Reports attending an orientation at [facility], with an initial appointment upcoming. - Reports persistent sore, scratchy throat for approximately one month since a visit to urgent care for hoarseness and sore throat. Diagnosed with allergies/sinus drainage at urgent care. A strep test was negative. Reports minimal improvement. Denies ear pain or difficulty swallowing, but notes pain with swallowing. - Back pain (sciatica) is improved. Has learned to avoid triggers at the gym (stair stepper, treadmill) and has new, more supportive furniture. Practices maintaining good posture. - Discussed starting cholesterol medication due to a 10-year cardiovascular risk of 12.5%, elevated LDL, smoking history, and family history. - Discussed erectile dysfunction and requested medication. - Requested referral for first-time screening colonoscopy.  Medications Current medications include ibuprofen  800mg  as needed for sciatica, a multivitamin, Allegra , and azelastine  nasal spray.  PMH, PSH, FH, Social Hx PMHx: Sciatica, hyperlipidemia. PSH: None reported. FHx: Heart attack/stroke in mother and father in their 37s. Social Hx: - Smoker: Started around age 54, stopped for 10 years around age 61, resumed in his 68s. Currently smokes about 1-2 cigarettes per day, up to 6 per day when drinking alcohol. No chewing tobacco. - Alcohol: Drinks [employer], about two shots, 3-4 times per week.  ROS HEENT: Positive for sore throat and hoarseness. Negative for ear pain, difficulty swallowing.  Objective General: No acute distress. HEENT: Examination of the oropharynx is difficult due to a small  opening. No obvious abnormalities seen. TMs appear normal. Cranial Nerves: EOMI.  Assessment and Plan # Hyperlipidemia Elevated LDL of 156. 10-year ASCVD risk is 12.5% based on risk factors including smoking and family history of premature cardiovascular disease. Discussed risks/benefits of statin therapy, including common side effects like myalgias and rare side effects like rhabdomyolysis. Also provided counseling on dietary changes to limit saturated fats (red meat, cheese, butter) and the importance of weight loss and exercise. - Start Crestor . - Will follow up in 2-3 months to recheck cholesterol. - Continue with [facility] program.  # Elevated PSA PSA increased from 3.8 to 5.1 over three years. Discussed that this is a mild elevation and often related to BPH rather than prostate cancer. Explained the plan to repeat the test with total and free PSA to better assess risk. - Repeat total and free PSA in one month. - If still elevated, will refer to urology for further evaluation.  # Persistent Pharyngitis Sore throat ongoing for about one month despite treatment for presumed allergies at urgent care. Negative strep test at that time. Exam today is limited but without obvious signs of acute infection. Given the duration of symptoms, will empirically treat for a possible bacterial cause. - Prescribed Keflex  500mg  twice daily for 5 days. - Instructed to hold new medications (Crestor , Cialis ) until the antibiotic course is complete. - If no improvement, will refer to ENT for further evaluation, possibly with laryngoscopy.  # Erectile Dysfunction Reports issues with erectile function. Discussed that this is often multifactorial, related to underlying vascular disease from hyperlipidemia. Discussed treatment options. - Prescribed Cialis  as needed. - Instructed to hold until antibiotic course is complete.  # Health Maintenance  Due for screening colonoscopy. - Referral placed  for gastroenterology for screening colonoscopy.  The 10-year ASCVD risk score (Arnett DK, et al., 2019) is: 12.6%  Health Maintenance Due  Topic Date Due   Hepatitis C Screening  Never done   Pneumococcal Vaccine: 19-49 Years (1 of 2 - PCV) Never done   Pneumococcal Vaccine: 50+ Years (1 of 2 - PCV) Never done   Colonoscopy  Never done   Zoster Vaccines- Shingrix (1 of 2) Never done   COVID-19 Vaccine (1 - 2024-25 season) Never done   INFLUENZA VACCINE  09/01/2023      Objective:     There were no vitals taken for this visit. {Vitals History (Optional):23777}  Physical Exam   No results found for any visits on 09/05/23.      Assessment & Plan:   There are no diagnoses linked to this encounter.   No follow-ups on file.    Toribio MARLA Slain, MD

## 2023-09-05 NOTE — Patient Instructions (Signed)
 It was nice to see you today,  We addressed the following topics today: -I have sent in a medication for your cholesterol.  Take this once a day. - I have sent in a medication called Cialis .  Take this as needed every other day. - I have sent in a medication called Keflex  for your sore throat.  This is an antibiotic.  Take this twice a day for 5 days. - Do not take your Cialis  and Crestor  until you finished your antibiotic. - I have sent in your referral to the gastroenterologist.  Have a great day,  Rolan Slain, MD

## 2023-09-06 DIAGNOSIS — N529 Male erectile dysfunction, unspecified: Secondary | ICD-10-CM | POA: Insufficient documentation

## 2023-09-06 DIAGNOSIS — R972 Elevated prostate specific antigen [PSA]: Secondary | ICD-10-CM | POA: Insufficient documentation

## 2023-09-06 DIAGNOSIS — J029 Acute pharyngitis, unspecified: Secondary | ICD-10-CM | POA: Insufficient documentation

## 2023-09-06 NOTE — Assessment & Plan Note (Signed)
 Sore throat ongoing for about one month despite treatment for presumed allergies at urgent care. Negative strep test at that time. Exam today is limited but without obvious signs of acute infection. Given the duration of symptoms, will empirically treat for a possible bacterial cause. - Prescribed Keflex  500mg  twice daily for 5 days. - Instructed to hold new medications (Crestor , Cialis ) until the antibiotic course is complete. - If no improvement, will refer to ENT for further evaluation, possibly with laryngoscopy.

## 2023-09-06 NOTE — Assessment & Plan Note (Signed)
 PSA increased from 3.8 to 5.1 over three years.   Explained the plan to repeat the test with total and free PSA to better assess risk. - Repeat total and free PSA in one month. - If still elevated, will refer to urology for further evaluation.

## 2023-09-06 NOTE — Assessment & Plan Note (Signed)
 Elevated LDL of 156. 10-year ASCVD risk is 12.5% based on risk factors including smoking and family history of premature cardiovascular disease. Discussed risks/benefits of statin therapy, including common side effects like myalgias and rare side effects like rhabdomyolysis. Also provided counseling on dietary changes to limit saturated fats (red meat, cheese, butter) and the importance of weight loss and exercise. - Start Crestor . - Will follow up in 2-3 months to recheck cholesterol. - Continue with [facility] program.

## 2023-09-06 NOTE — Assessment & Plan Note (Signed)
 Reports issues with erectile function. Discussed that this is often multifactorial, related to underlying vascular disease from hyperlipidemia. Discussed treatment options. - Prescribed Cialis  as needed. - Instructed to hold until antibiotic course is complete.

## 2023-09-07 ENCOUNTER — Ambulatory Visit: Admitting: Family Medicine

## 2023-09-12 ENCOUNTER — Ambulatory Visit: Admitting: Family Medicine

## 2023-09-18 ENCOUNTER — Telehealth: Payer: Self-pay | Admitting: *Deleted

## 2023-09-18 DIAGNOSIS — J029 Acute pharyngitis, unspecified: Secondary | ICD-10-CM

## 2023-09-18 NOTE — Telephone Encounter (Signed)
 Copied from CRM #[id number]. Topic: Referral - Request for Referral >> Sep 18, 2023  8:06 AM Myrick T wrote: Did the patient discuss referral with their provider in the last year? Yes  Appointment offered? No  Type of order/referral and detailed reason for visit: ENT  Preference of office, provider, location: unkown  If referral order, have you been seen by this specialty before? No (If Yes, this issue or another issue? When? Where?  Can we respond through MyChart? Yes

## 2023-09-19 NOTE — Progress Notes (Deleted)
 [address] [place],  [place], [place] [address]  Office: [phone]  /  Fax: [phone]   Subjective   Initial Visit  Brandon Snow (MR# [id number]) is a 63 y.o. male who presents for evaluation and treatment of obesity and related comorbidities. Current BMI is There is no height or weight on file to calculate BMI. Brandon Snow has been struggling with his weight for many years and has been unsuccessful in either losing weight, maintaining weight loss, or reaching his healthy weight goal.  Brandon Snow is currently in the action stage of change and ready to dedicate time achieving and maintaining a healthier weight. Brandon Snow is interested in becoming our patient and working on intensive lifestyle modifications including (but not limited to) diet and exercise for weight loss.  Brandon Snow has pure hypercholesterolemia and has recently started treatment with Rosuvastatin  5 mg every day  He has a history of prediabetes with A1c of 5.7 in the past.  Most recent A1c was 5.4 on 08/31/23. Weight history:  When asked how their weight has affected their life and health, he states: {EMWeightAffected:28305}  When asked what else they would like to accomplish? He states: {EMHopetoaccomplish:28304::Adopt a healthier eating pattern and lifestyle,Improve energy levels and physical activity,Improve existing medical conditions,Improve quality of life}  He starting to note weight gain during : {emstartedtogainweight:31588}.  Life events associated with weight gain include : {emlifeeventsweighgain:31589}.   Other contributing factors: {EMcontributingfactors:28307}.  Their highest weight has been:  *** lbs.  Desired weight: ***  Previous weight-loss programs : {emweightlossprograms:31590::None}.  Their maximum weight loss was:  *** lbs.  Their greatest challenge with dieting: {emgreatestchallengediet:31593}.  Current or previous pharmacotherapy: {EM previousRx:28311}.  Response to medication:  {EMResponsetomedication:28312}   Nutritional History:  Current nutrition plan: {EMNutritionplan:28309::None}.  How many times do you eat outside the home: {emfrequency:31645}  How often do they skip meals: {emskipmeals:31594::does not skip meals}  What beverages do they drink: {embeverages:31595}.   Use of artificial sweetners : {Yes/No:30480221}  Food intolerances or dislikes: {emfoodintolerance:31596::none}.  Food triggers: {emfoodtriggers:31600::None}.  Food cravings: {emfoodcravings:31601}  Do they struggle with excessive hunger or portion control : {YES/NO:21197}   Physical Activity:  Current level of physical activity: {EMcurrentPA:28310::None}  Barriers to Exercise: {embarrierstoexercise:32606::no barriers}   Past medical history includes:   Past Medical History:  Diagnosis Date   COVID-19 2021   Hypertension      Objective   There were no vitals taken for this visit. He was weighed on the bioimpedance scale: There is no height or weight on file to calculate BMI.    Anthropometrics:  No data recorded No data recorded No data recorded No data recorded  Physical Exam:  General: He is overweight, cooperative, alert, well developed, and in no acute distress. PSYCH: Has normal mood, affect and thought process.   HEENT: EOMI, sclerae are anicteric. Lungs: Normal breathing effort, no conversational dyspnea. Extremities: No edema.  Neurologic: No gross sensory or motor deficits. No tremors or fasciculations noted.    Diagnostic Data Reviewed  EKG: Normal sinus rhythm, rate ***. No conduction abnormalities, abnormal Q waves or chamber enlargement.  Indirect Calorimeter completed today shows a VO2 of *** and a REE of ***.  His calculated basal metabolic rate is *** thus his {zfprmzdlou:68397}.  Depression Screen  Brandon Snow's PHQ-9 score was: ***.     08/01/2023    2:47 PM  Depression screen PHQ 2/9  Decreased Interest 1  Down, Depressed,  Hopeless 0  PHQ - 2 Score 1  Altered sleeping 1  Tired,  decreased energy 0  Change in appetite 0  Feeling bad or failure about yourself  0  Trouble concentrating 0  Moving slowly or fidgety/restless 0  Suicidal thoughts 0  PHQ-9 Score 2    Screening for Sleep Related Breathing Disorders  Brandon Snow {Actions; denies/reports/admits to:19208} daytime somnolence and {Actions; denies/reports/admits to:19208} waking up still tired. Patient has a history of symptoms of {OSA Sx:17850}. Brandon Snow generally gets {numbers (fuzzy):14653} hours of sleep per night, and states that he has {sleep quality:17851}. Snoring {is/are not:32546} present. Apneic episodes {is/are not:32546} present. Epworth Sleepiness Score is ***.   BMET    Component Value Date/Time   NA 141 08/31/2023 1115   K 4.5 08/31/2023 1115   CL 104 08/31/2023 1115   CO2 19 (L) 08/31/2023 1115   GLUCOSE 105 (H) 08/31/2023 1115   GLUCOSE 96 01/01/2022 0240   BUN 12 08/31/2023 1115   CREATININE 0.87 08/31/2023 1115   CALCIUM  9.0 08/31/2023 1115   GFRNONAA >60 01/01/2022 0240   GFRAA 116 11/18/2019 1156   Lab Results  Component Value Date   HGBA1C 5.4 08/31/2023   HGBA1C 5.6 11/01/2019   No results found for: INSULIN  CBC    Component Value Date/Time   WBC 6.6 08/31/2023 1115   WBC 8.7 11/14/2019 0058   RBC 4.17 08/31/2023 1115   RBC 4.43 11/14/2019 0058   HGB 14.0 08/31/2023 1115   HCT 42.1 08/31/2023 1115   PLT 243 08/31/2023 1115   MCV 101 (H) 08/31/2023 1115   MCH 33.6 (H) 08/31/2023 1115   MCH 31.8 11/14/2019 0058   MCHC 33.3 08/31/2023 1115   MCHC 33.3 11/14/2019 0058   RDW 11.8 08/31/2023 1115   Iron/TIBC/Ferritin/ %Sat    Component Value Date/Time   FERRITIN 3,423 (H) 10/30/2019 1918   Lipid Panel     Component Value Date/Time   CHOL 241 (H) 08/31/2023 1115   TRIG 100 08/31/2023 1115   HDL 68 08/31/2023 1115   CHOLHDL 3.5 08/31/2023 1115   LDLCALC 156 (H) 08/31/2023 1115   Hepatic Function Panel      Component Value Date/Time   PROT 7.1 08/31/2023 1115   ALBUMIN 4.1 08/31/2023 1115   AST 23 08/31/2023 1115   ALT 20 08/31/2023 1115   ALKPHOS 104 08/31/2023 1115   BILITOT 0.6 08/31/2023 1115      Component Value Date/Time   TSH 1.160 06/15/2020 1041     Assessment and Plan   TREATMENT PLAN FOR OBESITY:  Recommended Dietary Goals  Sahith is currently in the action stage of change. As such, his goal is to implement medically supervised obesity management plan.  He has agreed to implement: {emwtlossplannewint:31639}  Behavioral Intervention  We discussed the following Behavioral Modification Strategies today: {EMwtlossstrategiesnewint:31640::increasing lean protein intake to established goals,decreasing simple carbohydrates ,increasing vegetables,increasing lower glycemic fruits,increasing fiber rich foods,avoiding skipping meals,increasing water intake,work on meal planning and preparation,reading food labels ,keeping healthy foods at home,identifying sources and decreasing liquid calories,decreasing eating out or consumption of processed foods, and making healthy choices when eating convenient foods,planning for success,better snacking choices}  Additional resources provided today: {emadditionalresourcesnewint:32116::Handout on healthy eating and balanced plate,Handout on complex carbohydrates and lean sources of protein,Handout principles of weight management}  Recommended Physical Activity Goals  Tildon has been advised to work up to 150 minutes of moderate intensity aerobic activity a week and strengthening exercises 2-3 times per week for cardiovascular health, weight loss maintenance and preservation of muscle mass.   He has agreed to :  {EMEXERCISE:28847::Think  about enjoyable ways to increase daily physical activity and overcoming barriers to exercise,Increase physical activity in their day and reduce sedentary time (increase  NEAT).}  Medical Interventions and Pharmacotherapy We will work on building a Therapist, art and behavioral strategies. We will discuss the role of pharmacotherapy as an adjunct at subsequent visits.   ASSOCIATED CONDITIONS ADDRESSED TODAY  Other Fatigue ***. Kal does feel that his weight is causing his energy to be lower than it should be. Fatigue may be related to obesity, depression or many other causes. Labs will be ordered, and in the meanwhile, Jeffry will focus on self care including making healthy food choices, increasing physical activity and focusing on stress reduction.  Shortness of Breath Jaquavian notes increasing shortness of breath with physical activity and seems to be worsening over time with weight gain. He notes getting out of breath sooner with activity than he used to. This has not gotten worse recently. Nickolai denies shortness of breath at rest or orthopnea.  There are no diagnoses linked to this encounter.  Follow-up  He was informed of the importance of frequent follow-up visits to maximize his success with intensive lifestyle modifications for his multiple health conditions. He was informed we would discuss his lab results at his next visit unless there is a critical issue that needs to be addressed sooner. Gaines agreed to keep his next visit at the agreed upon time to discuss these results.  Attestation Statement  This is the patient's intake visit at [facility]. The patient's Health Questionnaire was reviewed at length. Included in the packet: current and past health history, medications, allergies, ROS, gynecologic history (women only), surgical history, family history, social history, weight history, weight loss surgery history (for those that have had weight loss surgery), nutritional evaluation, mood and food questionnaire, PHQ9, Epworth questionnaire, sleep habits questionnaire, patient life and health improvement goals  questionnaire. These will all be scanned into the patient's chart under media.   During the visit, I independently reviewed the patient's EKG***, previous labs, bioimpedance scale results, and indirect calorimetry results. I used this information to medically tailor a meal plan for the patient that will help him to lose weight and will improve his obesity-related conditions. I performed a medically necessary appropriate examination and/or evaluation. I discussed the assessment and treatment plan with the patient. The patient was provided an opportunity to ask questions and all were answered. The patient agreed with the plan and demonstrated an understanding of the instructions. Labs were ordered at this visit and will be reviewed at the next visit unless critical results need to be addressed immediately. Clinical information was updated and documented in the EMR.   In addition, they received basic education on identification of processed foods and reduction of these, different sources of lean proteins and complex carbohydrates and how to eat balanced by incorporation of whole foods.  Reviewed by clinician on day of visit: allergies, medications, problem list, medical history, surgical history, family history, social history, and previous encounter notes.  I have spent *** minutes in the care of the patient today including: {NUMBER 1-10:22536} minutes before the visit reviewing and preparing the chart. *** minutes face-to-face {emfacetoface:32598::assessing and reviewing listed medical problems as outlined in obesity care plan,providing nutritional and behavioral counseling on topics outlined in the obesity care plan,independently interpreting test results and goals of care, as described in assessment and plan,reviewing and discussing biometric information and progress} {NUMBER 1-10:22536} minutes after the visit updating chart and documentation of  encounter.       Shavonne Ambroise ANP-C

## 2023-09-19 NOTE — Addendum Note (Signed)
 Addended by: CHANDRA TORIBIO POUR on: 09/19/2023 06:40 AM   Modules accepted: Orders

## 2023-09-19 NOTE — Telephone Encounter (Signed)
 Referral has been placed.

## 2023-09-20 ENCOUNTER — Ambulatory Visit (INDEPENDENT_AMBULATORY_CARE_PROVIDER_SITE_OTHER): Payer: Self-pay | Admitting: Nurse Practitioner

## 2023-09-20 ENCOUNTER — Ambulatory Visit: Admitting: Family Medicine

## 2023-09-20 DIAGNOSIS — E559 Vitamin D deficiency, unspecified: Secondary | ICD-10-CM

## 2023-09-20 DIAGNOSIS — E782 Mixed hyperlipidemia: Secondary | ICD-10-CM

## 2023-09-20 DIAGNOSIS — E66813 Obesity, class 3: Secondary | ICD-10-CM

## 2023-09-20 DIAGNOSIS — R7303 Prediabetes: Secondary | ICD-10-CM

## 2023-09-26 ENCOUNTER — Ambulatory Visit: Admitting: Family Medicine

## 2023-09-27 ENCOUNTER — Ambulatory Visit: Admitting: Family Medicine

## 2023-10-04 ENCOUNTER — Institutional Professional Consult (permissible substitution) (INDEPENDENT_AMBULATORY_CARE_PROVIDER_SITE_OTHER)

## 2023-10-05 ENCOUNTER — Ambulatory Visit (INDEPENDENT_AMBULATORY_CARE_PROVIDER_SITE_OTHER): Payer: Self-pay | Admitting: Nurse Practitioner

## 2023-10-08 ENCOUNTER — Other Ambulatory Visit: Payer: Self-pay | Admitting: Physical Medicine and Rehabilitation

## 2023-10-11 ENCOUNTER — Ambulatory Visit: Admitting: Family Medicine

## 2023-10-12 ENCOUNTER — Encounter (INDEPENDENT_AMBULATORY_CARE_PROVIDER_SITE_OTHER): Payer: Self-pay

## 2023-10-12 ENCOUNTER — Ambulatory Visit (INDEPENDENT_AMBULATORY_CARE_PROVIDER_SITE_OTHER)

## 2023-10-12 VITALS — BP 161/98 | HR 84

## 2023-10-12 DIAGNOSIS — F17210 Nicotine dependence, cigarettes, uncomplicated: Secondary | ICD-10-CM | POA: Diagnosis not present

## 2023-10-12 DIAGNOSIS — F1090 Alcohol use, unspecified, uncomplicated: Secondary | ICD-10-CM

## 2023-10-12 DIAGNOSIS — J040 Acute laryngitis: Secondary | ICD-10-CM | POA: Diagnosis not present

## 2023-10-12 DIAGNOSIS — R490 Dysphonia: Secondary | ICD-10-CM

## 2023-10-12 DIAGNOSIS — F1290 Cannabis use, unspecified, uncomplicated: Secondary | ICD-10-CM | POA: Diagnosis not present

## 2023-10-12 DIAGNOSIS — K219 Gastro-esophageal reflux disease without esophagitis: Secondary | ICD-10-CM

## 2023-10-12 DIAGNOSIS — Z720 Tobacco use: Secondary | ICD-10-CM

## 2023-10-12 MED ORDER — OMEPRAZOLE 40 MG PO CPDR: 40.0000 mg | DELAYED_RELEASE_CAPSULE | Freq: Two times a day (BID) | ORAL | 3 refills | Status: DC

## 2023-10-12 NOTE — Progress Notes (Signed)
 Dear Dr. Chandra, Here is my assessment for our mutual patient, Brandon Snow. Thank you for allowing me the opportunity to care for your patient. Please do not hesitate to contact me should you have any other questions. Sincerely, Dr. Penne Croak  Otolaryngology Clinic Note Referring provider: Dr. Chandra HPI:  Brandon Snow is a 63 y.o. male kindly referred by Dr. Chandra for evaluation of sore throat. Started 5 weeks ago. Worse at night. Lately been sore all day. Tried antibiotics without relief. Points to throat. Ibuprofen  provides some relief. He did have voice changes - hoarseness for 2 weeks, improved. No weight loss, no dysphagia. No dyspnea.   No recent inhaler use.   H&N Surgery: none  Tobacco: Daily smoker. Alcohol: uses weekly  Independent Review of Additional Tests or Records:  None  PMH/Meds/All/SocHx/FamHx/ROS:   Past Medical History:  Diagnosis Date   COVID-19 2021   Hypertension      History reviewed. No pertinent surgical history.  Family History  Problem Relation Age of Onset   Diabetes Mother    Hyperlipidemia Mother    Hyperlipidemia Father    Hypertension Father    Hypertension Sister    Hyperlipidemia Sister    Diabetes Sister    Diabetes Maternal Grandfather    Diabetes Paternal Grandmother    Hyperlipidemia Paternal Grandmother    Hypertension Paternal Grandmother    Sudden death Paternal Grandmother    Heart attack Neg Hx    Colon cancer Neg Hx    Rectal cancer Neg Hx    Stomach cancer Neg Hx      Social Connections: Not on file      Current Outpatient Medications:    azelastine  (ASTELIN ) 0.1 % nasal spray, Place 1 spray into both nostrils 2 (two) times daily. Use in each nostril as directed, Disp: 30 mL, Rfl: 1   fexofenadine  (ALLEGRA ) 180 MG tablet, Take 1 tablet (180 mg total) by mouth daily., Disp: 30 tablet, Rfl: 0   ibuprofen  (ADVIL ) 800 MG tablet, TAKE 1 TABLET BY MOUTH EVERY 8 HOURS AS NEEDED, Disp: 90 tablet, Rfl: 0   Multiple  Vitamin (MULTIVITAMIN) tablet, Take 1 tablet by mouth daily., Disp: , Rfl:    omeprazole  (PRILOSEC) 40 MG capsule, Take 1 capsule (40 mg total) by mouth 2 (two) times daily before a meal., Disp: 60 capsule, Rfl: 3   rosuvastatin  (CRESTOR ) 5 MG tablet, Take 1 tablet (5 mg total) by mouth daily., Disp: 90 tablet, Rfl: 3   tadalafil  (CIALIS ) 10 MG tablet, Take 1 tablet (10 mg total) by mouth every other day as needed for erectile dysfunction., Disp: 10 tablet, Rfl: 1   Physical Exam:   BP (!) 161/98 (BP Location: Right Arm, Patient Position: Sitting, Cuff Size: Large) Comment: pt is aware of HBP. will check it later today  Pulse 84   SpO2 96%   Salient findings:  General: awake, alert, pleasant HEENT: NCAT, clear sclera, no nystagmus, pinna without external lesions, EAC clear, TM demonstrating normal landmarks and aerated middle ear, septum midline, bilateral inferior turbinates with2+, dentition edenutlous, uses dentures no oral lesions, symmetric tongue and palate movement, no oral cavity lesions, clear posterior oropharynx Neck: trachea midline, no masses/ymphadenopathy/thyromegaly CN II-XII grossly intact No respiratory distress or stridor  Seprately Identifiable Procedures:  I personally ordered, reviewed and interpreted the following with the patient today  Procedure Note Pre-procedure diagnosis:  Dysphonia and throat pain Post-procedure diagnosis: Same Procedure: Transnasal Fiberoptic Laryngoscopy, CPT 31575 - Mod 25 Indication:  Dysphonia and  throat pain Complications: None apparent EBL: 0 mL  The procedure was undertaken to further evaluate the patient's complaint of  Dysphonia and throat pain, with mirror exam inadequate for appropriate examination due to gag reflex and poor patient tolerance  Procedure:  Patient was identified as correct patient. Verbal consent was obtained. The nose was sprayed with oxymetazoline  and 4% lidocaine . The The flexible laryngoscope was passed  through the nose to view the nasal cavity, pharynx (oropharynx, hypopharynx) and larynx.  The larynx was examined at rest and during multiple phonatory tasks. Documentation was obtained and reviewed with patient. The scope was removed. The patient tolerated the procedure well.  Findings: The nasal cavity and nasopharynx did not reveal any masses or lesions, mucosa appeared to be without obvious lesions. The tongue base, pharyngeal walls, piriform sinuses, vallecula, epiglottis and postcricoid region are normal in appearance EXCEPT: severe erythema of the arytenoids with edema. The visualized portion of the subglottis and proximal trachea is widely patent. The vocal folds are mobile bilaterally. There are no lesions on the free edge of the vocal folds nor elsewhere in the larynx worrisome for malignancy.     No crusting. No masses.  Photos taken   Electronically signed by: Penne Croak, DO 10/12/2023 3:52 PM   Impression & Plans:  Brandon Snow is a 63 y.o. male with hoarseness, globus, and LPR  1. Laryngopharyngeal reflux (LPR)   2. Laryngitis, acute   3. Tobacco use   4. Marijuana use   5. Alcohol use    Plan - today's findings were discussed with the patient - today's diagnoses were discussed in detail with the patient - Very erythematous appearing larynx.  I suggest this is severe LPR exacerbated by his ibuprofen  use.  - Decrease ibuprofen  use - Start omeprazole  - Discontinue smoking and alcohol use.   - See below regarding exact medications prescribed this encounter including dosages and route: Meds ordered this encounter  Medications   omeprazole  (PRILOSEC) 40 MG capsule    Sig: Take 1 capsule (40 mg total) by mouth 2 (two) times daily before a meal.    Dispense:  60 capsule    Refill:  3  Given the patient's tobacco use, I also discussed cessation and options for cessation, including counseling. Counseled patient on the dangers of tobacco use, advised patient to stop  smoking, and reviewed strategies to maximize success. Patient is  not ready to quit, and declined further treatment. Total time spent with this was 4 minutes.   CPT 99406 (3-10 mins)  - f/u 6 weeks. Plan to rescope if not improved.   Thank you for allowing me the opportunity to care for your patient. Please do not hesitate to contact me should you have any other questions.  Sincerely, Penne Croak, DO Otolaryngologist (ENT) [facility] Phone: [phone] Fax: [phone]  10/12/2023, 3:52 PM

## 2023-10-19 ENCOUNTER — Encounter (INDEPENDENT_AMBULATORY_CARE_PROVIDER_SITE_OTHER): Payer: Self-pay

## 2023-10-19 ENCOUNTER — Ambulatory Visit (INDEPENDENT_AMBULATORY_CARE_PROVIDER_SITE_OTHER): Payer: Self-pay | Admitting: Family Medicine

## 2023-10-26 ENCOUNTER — Telehealth (INDEPENDENT_AMBULATORY_CARE_PROVIDER_SITE_OTHER): Payer: Self-pay

## 2023-10-26 NOTE — Telephone Encounter (Signed)
 LVM to reschedule 11/24/2023 appointment - Dr Anice not in office

## 2023-11-02 ENCOUNTER — Ambulatory Visit (INDEPENDENT_AMBULATORY_CARE_PROVIDER_SITE_OTHER): Payer: Self-pay | Admitting: Family Medicine

## 2023-11-02 ENCOUNTER — Encounter: Payer: Self-pay | Admitting: Family Medicine

## 2023-11-07 ENCOUNTER — Other Ambulatory Visit

## 2023-11-07 DIAGNOSIS — R972 Elevated prostate specific antigen [PSA]: Secondary | ICD-10-CM

## 2023-11-07 DIAGNOSIS — I10 Essential (primary) hypertension: Secondary | ICD-10-CM

## 2023-11-07 DIAGNOSIS — E7800 Pure hypercholesterolemia, unspecified: Secondary | ICD-10-CM

## 2023-11-13 ENCOUNTER — Other Ambulatory Visit (INDEPENDENT_AMBULATORY_CARE_PROVIDER_SITE_OTHER): Payer: Self-pay

## 2023-11-14 ENCOUNTER — Encounter: Payer: Self-pay | Admitting: Family Medicine

## 2023-11-14 ENCOUNTER — Ambulatory Visit: Admitting: Family Medicine

## 2023-11-14 ENCOUNTER — Telehealth: Payer: Self-pay | Admitting: *Deleted

## 2023-11-14 NOTE — Telephone Encounter (Signed)
 Contacted pt about this and I informed him that it could have been a reminder call he said he would call back to reschedule his missed appt.

## 2023-11-14 NOTE — Telephone Encounter (Signed)
 Copied from CRM [id number]. Topic: General - Call Back - No Documentation >> Nov 13, 2023  2:54 PM Winona R wrote: Pt returning call but there is no documentation on the call.

## 2023-11-15 ENCOUNTER — Ambulatory Visit (INDEPENDENT_AMBULATORY_CARE_PROVIDER_SITE_OTHER): Payer: Self-pay | Admitting: Family Medicine

## 2023-11-19 ENCOUNTER — Other Ambulatory Visit: Payer: Self-pay | Admitting: Physical Medicine and Rehabilitation

## 2023-11-24 ENCOUNTER — Ambulatory Visit (INDEPENDENT_AMBULATORY_CARE_PROVIDER_SITE_OTHER)

## 2023-11-27 ENCOUNTER — Encounter (INDEPENDENT_AMBULATORY_CARE_PROVIDER_SITE_OTHER): Payer: Self-pay

## 2023-11-27 ENCOUNTER — Ambulatory Visit (INDEPENDENT_AMBULATORY_CARE_PROVIDER_SITE_OTHER): Payer: Self-pay | Admitting: Nurse Practitioner

## 2023-11-30 ENCOUNTER — Other Ambulatory Visit: Payer: Self-pay

## 2023-12-01 ENCOUNTER — Ambulatory Visit: Payer: Self-pay | Admitting: Family Medicine

## 2023-12-01 DIAGNOSIS — R972 Elevated prostate specific antigen [PSA]: Secondary | ICD-10-CM

## 2023-12-01 LAB — COMPREHENSIVE METABOLIC PANEL WITH GFR
ALT: 21 IU/L (ref 0–44)
AST: 25 IU/L (ref 0–40)
Albumin: 4 g/dL (ref 3.8–4.9)
Alkaline Phosphatase: 114 IU/L (ref 47–123)
BUN/Creatinine Ratio: 13 (ref 10–24)
BUN: 12 mg/dL (ref 8–27)
Bilirubin Total: 0.5 mg/dL (ref 0.0–1.2)
CO2: 23 mmol/L (ref 20–29)
Calcium: 8.9 mg/dL (ref 8.6–10.2)
Chloride: 102 mmol/L (ref 96–106)
Creatinine, Ser: 0.9 mg/dL (ref 0.76–1.27)
Globulin, Total: 3.3 g/dL (ref 1.5–4.5)
Glucose: 108 mg/dL — ABNORMAL HIGH (ref 70–99)
Potassium: 4.5 mmol/L (ref 3.5–5.2)
Sodium: 138 mmol/L (ref 134–144)
Total Protein: 7.3 g/dL (ref 6.0–8.5)
eGFR: 98 mL/min/1.73 (ref >59)

## 2023-12-01 LAB — LIPID PANEL
Chol/HDL Ratio: 4.1 ratio (ref 0.0–5.0)
Cholesterol, Total: 239 mg/dL — ABNORMAL HIGH (ref 100–199)
HDL: 59 mg/dL (ref >39)
LDL Chol Calc (NIH): 160 mg/dL — ABNORMAL HIGH (ref 0–99)
Triglycerides: 114 mg/dL (ref 0–149)
VLDL Cholesterol Cal: 20 mg/dL (ref 5–40)

## 2023-12-01 LAB — PSA, TOTAL AND FREE
PSA, Free Pct: 10.1 %
PSA, Free: 0.68 ng/mL
Prostate Specific Ag, Serum: 6.7 ng/mL — ABNORMAL HIGH (ref 0.0–4.0)

## 2023-12-04 ENCOUNTER — Encounter: Payer: Self-pay | Admitting: Radiology

## 2023-12-04 NOTE — Progress Notes (Signed)
 Called patient he is advised of his lab results and recommendation

## 2023-12-06 ENCOUNTER — Ambulatory Visit (INDEPENDENT_AMBULATORY_CARE_PROVIDER_SITE_OTHER)

## 2023-12-11 ENCOUNTER — Ambulatory Visit (INDEPENDENT_AMBULATORY_CARE_PROVIDER_SITE_OTHER): Payer: Self-pay | Admitting: Nurse Practitioner

## 2023-12-15 ENCOUNTER — Ambulatory Visit (INDEPENDENT_AMBULATORY_CARE_PROVIDER_SITE_OTHER)

## 2023-12-19 ENCOUNTER — Ambulatory Visit (INDEPENDENT_AMBULATORY_CARE_PROVIDER_SITE_OTHER)

## 2023-12-19 ENCOUNTER — Encounter (INDEPENDENT_AMBULATORY_CARE_PROVIDER_SITE_OTHER): Payer: Self-pay

## 2023-12-19 VITALS — BP 128/82 | HR 82 | Temp 98.0°F | Wt 286.0 lb

## 2023-12-19 DIAGNOSIS — K219 Gastro-esophageal reflux disease without esophagitis: Secondary | ICD-10-CM | POA: Diagnosis not present

## 2023-12-19 DIAGNOSIS — F1090 Alcohol use, unspecified, uncomplicated: Secondary | ICD-10-CM

## 2023-12-19 DIAGNOSIS — J312 Chronic pharyngitis: Secondary | ICD-10-CM

## 2023-12-19 DIAGNOSIS — Z720 Tobacco use: Secondary | ICD-10-CM

## 2023-12-19 DIAGNOSIS — R0981 Nasal congestion: Secondary | ICD-10-CM | POA: Diagnosis not present

## 2023-12-19 DIAGNOSIS — F1290 Cannabis use, unspecified, uncomplicated: Secondary | ICD-10-CM

## 2023-12-19 MED ORDER — SALINE SPRAY 0.65 % NA SOLN: 1.0000 | Freq: Four times a day (QID) | NASAL | 0 refills | Status: DC | PRN

## 2023-12-19 NOTE — Progress Notes (Signed)
 Dear Dr. Chandra, Here is my assessment for our mutual patient, Brandon Snow. Thank you for allowing me the opportunity to care for your patient. Please do not hesitate to contact me should you have any other questions. Sincerely, Dr. Penne Croak  Otolaryngology Clinic Note Referring provider: Dr. Chandra HPI:  Discussed the use of AI scribe software for clinical note transcription with the patient, who gave verbal consent to proceed.  History of Present Illness Brandon Snow is a 63 year old male who presents with throat issues and sinus congestion.  Pharyngolaryngeal symptoms - Significant improvement in throat symptoms since starting omeprazole  twice daily, though occasionally misses the second dose - No recent sore throat - Previously, symptoms were exacerbated by eating at night and lying down, but these have improved with dietary and lifestyle modifications - Weight loss of 27 pounds attributed to dietary changes, including reduced intake of foods such as lasagna  Nasal congestion and sinus symptoms - Persistent sinus stuffiness and congestion, particularly at night and in different areas of the house - Symptoms have been intermittent for weeks - Clear nasal discharge - Fluctuating sense of smell over the past two weeks - Partial relief with Sudafed  Tobacco and alcohol use - Reduced cigarette consumption from 12-15 per day to approximately four per day, primarily at night while drinking - Social alcohol consumption 3-4 times per week with low tolerance - Actively working on reducing both smoking and alcohol intake  Independent Review of Additional Tests or Records:  Reviewed external note from referring PCP, Olson,describing relevant history incorporated into today's evaluation.   PMH/Meds/All/SocHx/FamHx/ROS:   Past Medical History:  Diagnosis Date   COVID-19 2021   Hypertension      History reviewed. No pertinent surgical history.  Family History  Problem  Relation Age of Onset   Diabetes Mother    Hyperlipidemia Mother    Hyperlipidemia Father    Hypertension Father    Hypertension Sister    Hyperlipidemia Sister    Diabetes Sister    Diabetes Maternal Grandfather    Diabetes Paternal Grandmother    Hyperlipidemia Paternal Grandmother    Hypertension Paternal Grandmother    Sudden death Paternal Grandmother    Heart attack Neg Hx    Colon cancer Neg Hx    Rectal cancer Neg Hx    Stomach cancer Neg Hx      Social Connections: Not on file      Current Outpatient Medications:    azelastine  (ASTELIN ) 0.1 % nasal spray, Place 1 spray into both nostrils 2 (two) times daily. Use in each nostril as directed, Disp: 30 mL, Rfl: 1   fexofenadine  (ALLEGRA ) 180 MG tablet, Take 1 tablet (180 mg total) by mouth daily., Disp: 30 tablet, Rfl: 0   ibuprofen  (ADVIL ) 800 MG tablet, TAKE 1 TABLET BY MOUTH EVERY 8 HOURS AS NEEDED, Disp: 90 tablet, Rfl: 0   Multiple Vitamin (MULTIVITAMIN) tablet, Take 1 tablet by mouth daily., Disp: , Rfl:    omeprazole  (PRILOSEC) 40 MG capsule, Take 1 capsule (40 mg total) by mouth 2 (two) times daily before a meal., Disp: 60 capsule, Rfl: 3   rosuvastatin  (CRESTOR ) 5 MG tablet, Take 1 tablet (5 mg total) by mouth daily., Disp: 90 tablet, Rfl: 3   sodium chloride  (OCEAN) 0.65 % SOLN nasal spray, Place 1 spray into both nostrils 4 (four) times daily as needed for congestion., Disp: 30 mL, Rfl: 0   tadalafil  (CIALIS ) 10 MG tablet, Take 1 tablet (10 mg  total) by mouth every other day as needed for erectile dysfunction., Disp: 10 tablet, Rfl: 1   Physical Exam:   BP 128/82 (BP Location: Right Arm, Patient Position: Sitting, Cuff Size: Normal)   Pulse 82   Temp 98 F (36.7 C)   Wt 286 lb (129.7 kg)   SpO2 95%   BMI 43.49 kg/m   The patient was awake, alert, and appropriate. The external ears were inspected, and otoscopy was performed to evaluate the external auditory canals and tympanic membranes. The nasal cavity and  septum were examined for mucosal changes, obstruction, or discharge. The oral cavity and oropharynx were inspected for mucosal lesions, infection, or tonsillar hypertrophy. The neck was palpated for lymphadenopathy, thyroid abnormalities, or other masses. Cranial nerve function was grossly intact.  Pertinent Findings: Physical Exam MEASUREMENTS: Weight- 284. HEENT: Normal oropharynx Nares with mild crusting bilaterally. EAC and Tms clear. Neck without lymphadenopathy.   Seprately Identifiable Procedures:  I personally ordered, reviewed and interpreted the following with the patient today  None. Declined FFL and nasal endoscopy today.   Impression & Plans:  Brandon Snow is a 63 y.o. male  1. Laryngopharyngeal reflux (LPR)   2. Tobacco use   3. Marijuana use   4. Alcohol use   5. Chronic sore throat    - Findings and diagnoses discussed in detail with the patient. - Risks, benefits, and alternatives were reviewed. Through shared decision making, the patient elects to proceed with below. Assessment & Plan Laryngopharyngeal reflux (LPR) Symptoms improved with lifestyle changes and omeprazole . Considering reducing omeprazole  if symptoms remain controlled. - Continue omeprazole  40 mg orally twice daily. - Consider reducing omeprazole  to once daily if symptoms remain controlled. - Continue lifestyle modifications including dietary changes and weight loss. - Provided sample of seaweed-based supplement for acid reflux management.  Acute sinus congestion Intermittent congestion with clear discharge, exacerbated by weather. Sudafed effective. - Recommended saline nasal spray for nasal irrigation. - Prescribed saline nasal spray if insurance covers; otherwise, advised over-the-counter purchase. - Continue Sudafed as needed for symptom relief. - Declined nasal endoscopy and antibiotic.  Patient is very motivated for weight loss and improving general health.   The patient is under my  ongoing management for chronic sore throat and LPR. This represents longitudinal specialty care requiring continued evaluation, coordination, and oversight. Today's visit reflects the inherent complexity of managing this condition over time, including repeat laryngoscopy, and ongoing treatment planning. I serve as the focal point of care for this condition and will continue to monitor and adjust management as clinically indicated.   - Orders placed: No orders of the defined types were placed in this encounter.  - Medications prescribed/continued/adjusted:  Meds ordered this encounter  Medications   sodium chloride  (OCEAN) 0.65 % SOLN nasal spray    Sig: Place 1 spray into both nostrils 4 (four) times daily as needed for congestion.    Dispense:  30 mL    Refill:  0   - Education materials provided to the patient. - Follow up: 3 months. Patient instructed to return sooner or go to the ED if new/worsening symptoms develop.  Thank you for allowing me the opportunity to care for your patient. Please do not hesitate to contact me should you have any other questions.  Sincerely, Penne Croak, DO Otolaryngologist (ENT) [facility] Phone: [phone] Fax: [phone]  12/19/2023, 2:33 PM

## 2023-12-26 ENCOUNTER — Telehealth (INDEPENDENT_AMBULATORY_CARE_PROVIDER_SITE_OTHER): Payer: Self-pay | Admitting: Nurse Practitioner

## 2023-12-26 ENCOUNTER — Ambulatory Visit (INDEPENDENT_AMBULATORY_CARE_PROVIDER_SITE_OTHER): Admitting: Nurse Practitioner

## 2023-12-26 ENCOUNTER — Encounter (INDEPENDENT_AMBULATORY_CARE_PROVIDER_SITE_OTHER): Payer: Self-pay

## 2023-12-26 NOTE — Telephone Encounter (Signed)
 11/25 Pt called requesting to reschedule this New Patient appt as he overslept. I advised the patient that we will no longer reschedule his appts as he has no showed or cancelled 6 New Patient Appts. [facility].

## 2023-12-31 NOTE — Progress Notes (Unsigned)
    Chief Complaint: Abnormal PSA test  History of Present Illness:  63 yo male presents for E/M of elevated PSA.  Previous PSA data: -5.16.2022-3.8 -7.31.2025--5.1 -10.30.2025--6.7 (10% free)   Past Medical History:  Past Medical History:  Diagnosis Date   COVID-19 2021   Hypertension     Past Surgical History:  No past surgical history on file.  Allergies:  No Known Allergies  Family History:  Family History  Problem Relation Age of Onset   Diabetes Mother    Hyperlipidemia Mother    Hyperlipidemia Father    Hypertension Father    Hypertension Sister    Hyperlipidemia Sister    Diabetes Sister    Diabetes Maternal Grandfather    Diabetes Paternal Grandmother    Hyperlipidemia Paternal Grandmother    Hypertension Paternal Grandmother    Sudden death Paternal Grandmother    Heart attack Neg Hx    Colon cancer Neg Hx    Rectal cancer Neg Hx    Stomach cancer Neg Hx     Social History:  Social History   Tobacco Use   Smoking status: Some Days    Current packs/day: 0.50    Types: Cigarettes   Smokeless tobacco: Never  Vaping Use   Vaping status: Never Used  Substance Use Topics   Alcohol use: Yes    Comment: sometimes   Drug use: No    Review of symptoms:  Constitutional:  Negative for unexplained weight loss, night sweats, fever, chills ENT:  Negative for nose bleeds, sinus pain, painful swallowing CV:  Negative for chest pain, shortness of breath, exercise intolerance, palpitations, loss of consciousness Resp:  Negative for cough, wheezing, shortness of breath GI:  Negative for nausea, vomiting, diarrhea, bloody stools GU:  Positives noted in HPI; otherwise negative for gross hematuria, dysuria, urinary incontinence Neuro:  Negative for seizures, poor balance, limb weakness, slurred speech Psych:  Negative for lack of energy, depression, anxiety Endocrine:  Negative for polydipsia, polyuria, symptoms of hypoglycemia (dizziness, hunger,  sweating) Hematologic:  Negative for anemia, purpura, petechia, prolonged or excessive bleeding, use of anticoagulants  Allergic:  Negative for difficulty breathing or choking as a result of exposure to anything; no shellfish allergy; no allergic response (rash/itch) to materials, foods  Physical exam: There were no vitals taken for this visit. GENERAL APPEARANCE:  Well appearing, well developed, well nourished, NAD HEENT: Atraumatic, Normocephalic. NECK: Normal appearance LUNGS: Normal inspiratory and expiratory excursion HEART: Regular Rate ABDOMEN: ***. GU: Phallus normal, no lesions. Scrotal skin normal. Testicles/epididymal structures normal. Meatus normal. Normal anal sphincter tone, prostate ***mL, symmetric, non nodular, non tender. EXTREMITIES: Moves all extremities well.  Without clubbing, cyanosis, or edema. NEUROLOGIC:  Alert and oriented x 3, normal gait, CN II-XII grossly intact.  MENTAL STATUS:  Appropriate. SKIN:  Warm, dry and intact.    Results:  I have reviewed referring/prior physicians notes  I have reviewed urinalysis  I have reviewed PSA results  I have reviewed recent CMP?CBC  Assessment: ***   Plan: ***

## 2024-01-01 ENCOUNTER — Ambulatory Visit: Admitting: Urology

## 2024-01-01 DIAGNOSIS — R972 Elevated prostate specific antigen [PSA]: Secondary | ICD-10-CM

## 2024-01-02 ENCOUNTER — Other Ambulatory Visit: Payer: Self-pay | Admitting: Physical Medicine and Rehabilitation

## 2024-01-03 ENCOUNTER — Ambulatory Visit: Admitting: Family Medicine

## 2024-01-09 ENCOUNTER — Ambulatory Visit (INDEPENDENT_AMBULATORY_CARE_PROVIDER_SITE_OTHER): Admitting: Nurse Practitioner

## 2024-02-17 ENCOUNTER — Other Ambulatory Visit: Payer: Self-pay | Admitting: Physical Medicine and Rehabilitation

## 2024-02-26 ENCOUNTER — Encounter (HOSPITAL_COMMUNITY): Payer: Self-pay

## 2024-02-26 ENCOUNTER — Ambulatory Visit (HOSPITAL_COMMUNITY)
Admission: EM | Admit: 2024-02-26 | Discharge: 2024-02-26 | Disposition: A | Discharge status: Home / Self Care | Attending: Physician Assistant | Admitting: Physician Assistant

## 2024-02-26 ENCOUNTER — Other Ambulatory Visit: Payer: Self-pay

## 2024-02-26 DIAGNOSIS — H02846 Edema of left eye, unspecified eyelid: Secondary | ICD-10-CM

## 2024-02-26 DIAGNOSIS — H0289 Other specified disorders of eyelid: Secondary | ICD-10-CM | POA: Diagnosis not present

## 2024-02-26 MED ORDER — AMOXICILLIN-POT CLAVULANATE 875-125 MG PO TABS: 1.0000 | ORAL_TABLET | Freq: Two times a day (BID) | ORAL | 0 refills | Status: AC

## 2024-02-26 NOTE — ED Provider Notes (Signed)
 " [facility]    CSN: [id number] Arrival date & time: 02/26/24  1424      History   Chief Complaint No chief complaint on file.   HPI Brandon Snow is a 64 y.o. male.   Patient presents today with a 3-day history of swelling and redness around his left eye.  He reports that this is minimally painful and rates the pain at 3 on a 0-10 pain scale, described as aching, no aggravating or alleviating factors identified.  It does respond well to ibuprofen  and when he takes  over-the-counter analgesics for his pain resolves.  He denies additional symptoms including fever, cough, congestion, sinus pressure.  Denies any visual disturbance, photophobia, foreign body sensation in the eye.  He does not wear glasses or contacts.  Denies any significant drainage  from the eye.  He is not currently followed by an ophthalmologist.  Denies any recent antibiotics.  He denies any trauma to the eye or periorbital region.  He is concerned because he continues to have discomfort and swelling and is unsure what caused these symptoms.    Past Medical History:  Diagnosis Date   COVID-19 2021   Hypertension     Patient Active Problem List   Diagnosis Date Noted   Elevated PSA 09/06/2023   Pharyngitis 09/06/2023   Erectile dysfunction 09/06/2023   Class 3 severe obesity due to excess calories without serious comorbidity with body mass index (BMI) of 45.0 to 49.9 in adult [facility]) 11/15/2022   Encounter for general adult medical examination with abnormal findings 06/29/2020   Degenerative disc disease at L5-S1 level 06/29/2020   Local superficial swelling, mass or lump 06/29/2020   HLD (hyperlipidemia) 06/29/2020   Prediabetes 06/29/2020   Encounter to establish care 05/25/2020   Acute left-sided low back pain with left-sided sciatica 05/25/2020   History of COVID-19 05/25/2020   Anxiety 12/31/2019   Generalized anxiety disorder 11/26/2019   Shortness of breath 11/26/2019   Physical  deconditioning 11/18/2019   Sepsis (HCC) 10/31/2019   Acute respiratory failure with hypoxemia (HCC) 10/31/2019   Elevated transaminase level 10/31/2019   Essential hypertension 10/31/2019   Pneumonia due to COVID-19 virus 10/30/2019   Right knee pain 11/26/2010    History reviewed. No pertinent surgical history.     Home Medications    Prior to Admission medications  Medication Sig Start Date End Date Taking? Authorizing Provider  amoxicillin -clavulanate (AUGMENTIN ) 875-125 MG tablet Take 1 tablet by mouth every 12 (twelve) hours. 02/26/24  Yes Chaquita Basques K, PA-C  azelastine  (ASTELIN ) 0.1 % nasal spray Place 1 spray into both nostrils 2 (two) times daily. Use in each nostril as directed 08/09/23   Reddick, Johnathan B, NP  fexofenadine  (ALLEGRA ) 180 MG tablet Take 1 tablet (180 mg total) by mouth daily. 08/09/23   Reddick, Johnathan B, NP  ibuprofen  (ADVIL ) 800 MG tablet TAKE 1 TABLET BY MOUTH EVERY 8 HOURS AS NEEDED 02/19/24   Williams, Megan E, NP  Multiple Vitamin (MULTIVITAMIN) tablet Take 1 tablet by mouth daily.    [provider]  tadalafil  (CIALIS ) 10 MG tablet Take 1 tablet (10 mg total) by mouth every other day as needed for erectile dysfunction. 09/05/23   Chandra Toribio POUR, MD    Family History Family History  Problem Relation Age of Onset   Diabetes Mother    Hyperlipidemia Mother    Hyperlipidemia Father    Hypertension Father    Hypertension Sister    Hyperlipidemia Sister  Diabetes Sister    Diabetes Maternal Grandfather    Diabetes Paternal Grandmother    Hyperlipidemia Paternal Grandmother    Hypertension Paternal Grandmother    Sudden death Paternal Grandmother    Heart attack Neg Hx    Colon cancer Neg Hx    Rectal cancer Neg Hx    Stomach cancer Neg Hx     Social History Social History[1]   Allergies   Patient has no known allergies.   Review of Systems Review of Systems  Constitutional:  Negative for activity change, appetite change,  fatigue and fever.  HENT:  Negative for congestion.   Eyes:  Positive for redness (Periorbital redness). Negative for photophobia, pain, discharge, itching and visual disturbance.  Respiratory:  Negative for cough.   Gastrointestinal:  Negative for nausea and vomiting.  Neurological:  Negative for dizziness, light-headedness and headaches.     Physical Exam Triage Vital Signs ED Triage Vitals  Encounter Vitals Group     BP 02/26/24 1542 133/78     Girls Systolic BP Percentile --      Girls Diastolic BP Percentile --      Boys Systolic BP Percentile --      Boys Diastolic BP Percentile --      Pulse Rate 02/26/24 1542 67     Resp 02/26/24 1542 18     Temp 02/26/24 1542 98 F (36.7 C)     Temp Source 02/26/24 1542 Oral     SpO2 02/26/24 1542 98 %     Weight --      Height --      Head Circumference --      Peak Flow --      Pain Score 02/26/24 1540 3     Pain Loc --      Pain Education --      Exclude from Growth Chart --    No data found.  Updated Vital Signs BP 133/78   Pulse 67   Temp 98 F (36.7 C) (Oral)   Resp 18   SpO2 98%   Visual Acuity Right Eye Distance: 20/25 Left Eye Distance: 20/25 Bilateral Distance: 20/25  Right Eye Near:   Left Eye Near:    Bilateral Near:     Physical Exam Vitals reviewed.  Constitutional:      General: He is awake.     Appearance: Normal appearance. He is well-developed. He is not ill-appearing.     Comments: Very pleasant male appears stated age in no acute distress sitting comfortably in exam room  HENT:     Head: Normocephalic and atraumatic. No raccoon eyes or Battle's sign.     Nose: Nose normal.     Mouth/Throat:     Pharynx: Uvula midline. No oropharyngeal exudate or posterior oropharyngeal erythema.  Eyes:     Extraocular Movements: Extraocular movements intact.     Conjunctiva/sclera:     Right eye: Right conjunctiva is not injected. No chemosis.    Left eye: Left conjunctiva is not injected. No chemosis.     Pupils: Pupils are equal, round, and reactive to light.     Comments: Left eye: Mild erythema and edema of upper and lower eyelid.  Normal extraocular movements with minimal discomfort.  PERRLA.  Appropriate vision.  Cardiovascular:     Rate and Rhythm: Normal rate and regular rhythm.     Heart sounds: Normal heart sounds, S1 normal and S2 normal. No murmur heard. Pulmonary:     Effort: Pulmonary effort is  normal.     Breath sounds: Normal breath sounds. No stridor. No wheezing, rhonchi or rales.     Comments: Clear to auscultation bilaterally Neurological:     Mental Status: He is alert.  Psychiatric:        Behavior: Behavior is cooperative.      UC Treatments / Results  Labs (all labs ordered are listed, but only abnormal results are displayed) Labs Reviewed - No data to display  EKG   Radiology No results found.  Procedures Procedures (including critical care time)  Medications Ordered in UC Medications - No data to display  Initial Impression / Assessment and Plan / UC Course  I have reviewed the triage vital signs and the nursing notes.  Pertinent labs & imaging results that were available during my care of the patient were reviewed by me and considered in my medical decision making (see chart for details).     Patient is well-appearing, afebrile, nontoxic, nontachycardic.  Low suspicion for preseptal cellulitis as patient has no significant pain with extraocular movements, however, given periorbital erythema and swelling will cover with Augmentin  twice daily for 7 days.  No indication for dose adjustment based on metabolic panel from 11/30/2023 with creatinine of 0.90 and calculated creatinine clearance of 158 mL/min.  He was encouraged to continue using Tylenol  and ibuprofen  for pain relief.  We discussed that if his symptoms do not completely resolve within a few days of starting antibiotics he should follow-up with an ophthalmologist and was given the contact  information for local provider with instruction to call to schedule an appointment.  Fluorescein  staining was deferred as patient had no ocular pain or foreign body sensation.  We discussed that if anything changes or worsens and he has high fever, pain with extraocular movements, nausea, vomiting, visual disturbance, eye pain he needs to be seen emergently.  Return precautions given.  Excuse note provided.  Final Clinical Impressions(s) / UC Diagnoses   Final diagnoses:  Pain and swelling of eyelid of left eye     Discharge Instructions      We are treating you for an infection around your eye.  Take Augmentin  twice daily for 7 days.  Please follow-up with ophthalmology (eye specialist) as soon as possible.  Call them to schedule an appointment.  If anything worsens and you have increasing pain, pain when you move your eyes, vision change, fever, increased swelling, severe headache, nausea/vomiting you need to go to the emergency room immediately.     ED Prescriptions     Medication Sig Dispense Auth. Provider   amoxicillin -clavulanate (AUGMENTIN ) 875-125 MG tablet Take 1 tablet by mouth every 12 (twelve) hours. 14 tablet Johari Bennetts K, PA-C      PDMP not reviewed this encounter.     [1]  Social History Tobacco Use   Smoking status: Some Days    Current packs/day: 0.50    Types: Cigarettes   Smokeless tobacco: Never  Vaping Use   Vaping status: Never Used  Substance Use Topics   Alcohol use: Yes    Comment: sometimes   Drug use: No     Sherrell Rocky POUR, PA-C 02/26/24 1712  "

## 2024-02-26 NOTE — Discharge Instructions (Signed)
 We are treating you for an infection around your eye.  Take Augmentin  twice daily for 7 days.  Please follow-up with ophthalmology (eye specialist) as soon as possible.  Call them to schedule an appointment.  If anything worsens and you have increasing pain, pain when you move your eyes, vision change, fever, increased swelling, severe headache, nausea/vomiting you need to go to the emergency room immediately.

## 2024-02-26 NOTE — ED Triage Notes (Signed)
 Pt c/o left eye pain and swellingx3d. Pt has 1+ swelling of left eye. Pt denies vision issues.

## 2024-03-06 ENCOUNTER — Ambulatory Visit: Admitting: Family Medicine

## 2024-03-22 ENCOUNTER — Ambulatory Visit (INDEPENDENT_AMBULATORY_CARE_PROVIDER_SITE_OTHER)

## 2024-03-25 ENCOUNTER — Ambulatory Visit (INDEPENDENT_AMBULATORY_CARE_PROVIDER_SITE_OTHER)
